# Patient Record
Sex: Female | Born: 1935 | Race: White | Hispanic: No | Smoking: Former smoker
Health system: Southern US, Community
[De-identification: ages and names within clinical notes are randomized; demographics above are authoritative.]

## PROBLEM LIST (undated history)

## (undated) DIAGNOSIS — E785 Hyperlipidemia, unspecified: Secondary | ICD-10-CM

## (undated) DIAGNOSIS — I240 Acute coronary thrombosis not resulting in myocardial infarction: Secondary | ICD-10-CM

## (undated) DIAGNOSIS — I10 Essential (primary) hypertension: Secondary | ICD-10-CM

## (undated) DIAGNOSIS — M1990 Unspecified osteoarthritis, unspecified site: Secondary | ICD-10-CM

## (undated) DIAGNOSIS — F0280 Dementia in other diseases classified elsewhere without behavioral disturbance: Secondary | ICD-10-CM

## (undated) DIAGNOSIS — I214 Non-ST elevation (NSTEMI) myocardial infarction: Secondary | ICD-10-CM

## (undated) DIAGNOSIS — Z86010 Personal history of colonic polyps: Secondary | ICD-10-CM

## (undated) DIAGNOSIS — I4891 Unspecified atrial fibrillation: Secondary | ICD-10-CM

## (undated) DIAGNOSIS — I429 Cardiomyopathy, unspecified: Secondary | ICD-10-CM

## (undated) DIAGNOSIS — K219 Gastro-esophageal reflux disease without esophagitis: Secondary | ICD-10-CM

## (undated) DIAGNOSIS — G309 Alzheimer's disease, unspecified: Secondary | ICD-10-CM

## (undated) DIAGNOSIS — M8580 Other specified disorders of bone density and structure, unspecified site: Secondary | ICD-10-CM

## (undated) DIAGNOSIS — G4700 Insomnia, unspecified: Secondary | ICD-10-CM

## (undated) DIAGNOSIS — N8502 Endometrial intraepithelial neoplasia [EIN]: Secondary | ICD-10-CM

## (undated) DIAGNOSIS — H3530 Unspecified macular degeneration: Secondary | ICD-10-CM

## (undated) HISTORY — DX: Insomnia, unspecified: G47.00

## (undated) HISTORY — DX: Non-ST elevation (NSTEMI) myocardial infarction: I21.4

## (undated) HISTORY — DX: Unspecified macular degeneration: H35.30

## (undated) HISTORY — DX: Endometrial intraepithelial neoplasia (EIN): N85.02

## (undated) HISTORY — DX: Acute coronary thrombosis not resulting in myocardial infarction: I24.0

## (undated) HISTORY — DX: Personal history of colonic polyps: Z86.010

## (undated) HISTORY — DX: Dementia in other diseases classified elsewhere, unspecified severity, without behavioral disturbance, psychotic disturbance, mood disturbance, and anxiety: F02.80

## (undated) HISTORY — DX: Alzheimer's disease, unspecified: G30.9

## (undated) HISTORY — DX: Cardiomyopathy, unspecified: I42.9

## (undated) HISTORY — DX: Unspecified osteoarthritis, unspecified site: M19.90

## (undated) HISTORY — DX: Other specified disorders of bone density and structure, unspecified site: M85.80

## (undated) HISTORY — DX: Hyperlipidemia, unspecified: E78.5

## (undated) HISTORY — DX: Unspecified atrial fibrillation: I48.91

---

## 1975-06-12 HISTORY — PX: APPENDECTOMY: SHX54

## 1993-06-11 DIAGNOSIS — N8502 Endometrial intraepithelial neoplasia [EIN]: Secondary | ICD-10-CM

## 1993-06-11 HISTORY — PX: ABDOMINAL HYSTERECTOMY: SHX81

## 1993-06-11 HISTORY — DX: Endometrial intraepithelial neoplasia (EIN): N85.02

## 1998-06-11 DIAGNOSIS — Z860100 Personal history of colon polyps, unspecified: Secondary | ICD-10-CM

## 1998-06-11 HISTORY — DX: Personal history of colonic polyps: Z86.010

## 1998-06-11 HISTORY — DX: Personal history of colon polyps, unspecified: Z86.0100

## 1999-01-05 ENCOUNTER — Other Ambulatory Visit: Admission: RE | Admit: 1999-01-05 | Discharge: 1999-01-05 | Payer: Self-pay | Admitting: Internal Medicine

## 1999-03-02 ENCOUNTER — Other Ambulatory Visit: Admission: RE | Admit: 1999-03-02 | Discharge: 1999-03-02 | Payer: Self-pay | Admitting: *Deleted

## 2001-03-06 ENCOUNTER — Other Ambulatory Visit: Admission: RE | Admit: 2001-03-06 | Discharge: 2001-03-06 | Payer: Self-pay | Admitting: *Deleted

## 2011-06-12 DIAGNOSIS — I4891 Unspecified atrial fibrillation: Secondary | ICD-10-CM

## 2011-06-12 HISTORY — DX: Unspecified atrial fibrillation: I48.91

## 2012-02-10 DIAGNOSIS — I214 Non-ST elevation (NSTEMI) myocardial infarction: Secondary | ICD-10-CM | POA: Insufficient documentation

## 2012-02-10 HISTORY — DX: Non-ST elevation (NSTEMI) myocardial infarction: I21.4

## 2012-02-18 ENCOUNTER — Inpatient Hospital Stay (HOSPITAL_COMMUNITY)
Admission: EM | Admit: 2012-02-18 | Discharge: 2012-02-22 | DRG: 281 | Disposition: A | Discharge status: Home / Self Care | Payer: Medicare Other | Attending: Cardiology | Admitting: Cardiology

## 2012-02-18 ENCOUNTER — Encounter (HOSPITAL_COMMUNITY): Payer: Self-pay | Admitting: Cardiology

## 2012-02-18 ENCOUNTER — Emergency Department (HOSPITAL_COMMUNITY): Payer: Medicare Other

## 2012-02-18 DIAGNOSIS — I428 Other cardiomyopathies: Secondary | ICD-10-CM | POA: Diagnosis present

## 2012-02-18 DIAGNOSIS — K219 Gastro-esophageal reflux disease without esophagitis: Secondary | ICD-10-CM | POA: Diagnosis present

## 2012-02-18 DIAGNOSIS — J069 Acute upper respiratory infection, unspecified: Secondary | ICD-10-CM | POA: Diagnosis present

## 2012-02-18 DIAGNOSIS — I10 Essential (primary) hypertension: Secondary | ICD-10-CM | POA: Diagnosis present

## 2012-02-18 DIAGNOSIS — I214 Non-ST elevation (NSTEMI) myocardial infarction: Secondary | ICD-10-CM

## 2012-02-18 DIAGNOSIS — F22 Delusional disorders: Secondary | ICD-10-CM | POA: Diagnosis not present

## 2012-02-18 DIAGNOSIS — I4891 Unspecified atrial fibrillation: Principal | ICD-10-CM

## 2012-02-18 DIAGNOSIS — Z823 Family history of stroke: Secondary | ICD-10-CM

## 2012-02-18 DIAGNOSIS — Z7901 Long term (current) use of anticoagulants: Secondary | ICD-10-CM

## 2012-02-18 DIAGNOSIS — Z87891 Personal history of nicotine dependence: Secondary | ICD-10-CM

## 2012-02-18 DIAGNOSIS — F05 Delirium due to known physiological condition: Secondary | ICD-10-CM | POA: Diagnosis not present

## 2012-02-18 DIAGNOSIS — Z79899 Other long term (current) drug therapy: Secondary | ICD-10-CM

## 2012-02-18 DIAGNOSIS — E785 Hyperlipidemia, unspecified: Secondary | ICD-10-CM | POA: Diagnosis present

## 2012-02-18 DIAGNOSIS — I2510 Atherosclerotic heart disease of native coronary artery without angina pectoris: Secondary | ICD-10-CM | POA: Diagnosis present

## 2012-02-18 HISTORY — DX: Essential (primary) hypertension: I10

## 2012-02-18 HISTORY — DX: Gastro-esophageal reflux disease without esophagitis: K21.9

## 2012-02-18 LAB — CBC WITH DIFFERENTIAL/PLATELET
Basophils Absolute: 0 10*3/uL (ref 0.0–0.1)
Basophils Relative: 0 % (ref 0–1)
Eosinophils Relative: 0 % (ref 0–5)
Lymphocytes Relative: 9 % — ABNORMAL LOW (ref 12–46)
MCHC: 33.3 g/dL (ref 30.0–36.0)
Monocytes Absolute: 1 10*3/uL (ref 0.1–1.0)
Neutro Abs: 11.7 10*3/uL — ABNORMAL HIGH (ref 1.7–7.7)
Platelets: 329 10*3/uL (ref 150–400)
RDW: 13.4 % (ref 11.5–15.5)
WBC: 14.1 10*3/uL — ABNORMAL HIGH (ref 4.0–10.5)

## 2012-02-18 LAB — COMPREHENSIVE METABOLIC PANEL
ALT: 28 U/L (ref 0–35)
Alkaline Phosphatase: 83 U/L (ref 39–117)
BUN: 15 mg/dL (ref 6–23)
CO2: 27 mEq/L (ref 19–32)
Chloride: 100 mEq/L (ref 96–112)
GFR calc Af Amer: 69 mL/min — ABNORMAL LOW (ref >90)
Glucose, Bld: 124 mg/dL — ABNORMAL HIGH (ref 70–99)
Potassium: 4.3 mEq/L (ref 3.5–5.1)
Sodium: 139 mEq/L (ref 135–145)
Total Bilirubin: 0.3 mg/dL (ref 0.3–1.2)

## 2012-02-18 LAB — PROTIME-INR: Prothrombin Time: 14.1 seconds (ref 11.6–15.2)

## 2012-02-18 LAB — POCT I-STAT TROPONIN I: Troponin i, poc: 6.96 ng/mL (ref 0.00–0.08)

## 2012-02-18 LAB — MRSA PCR SCREENING: MRSA by PCR: NEGATIVE

## 2012-02-18 MED ORDER — HEPARIN (PORCINE) IN NACL 100-0.45 UNIT/ML-% IJ SOLN
950.0000 [IU]/h | INTRAMUSCULAR | Status: DC
  Administered 2012-02-18: 700 [IU]/h via INTRAVENOUS
  Filled 2012-02-18 (×2): qty 250

## 2012-02-18 MED ORDER — METOPROLOL TARTRATE 25 MG PO TABS
25.0000 mg | ORAL_TABLET | Freq: Two times a day (BID) | ORAL | Status: DC
  Administered 2012-02-18: 25 mg via ORAL
  Filled 2012-02-18 (×3): qty 1

## 2012-02-18 MED ORDER — ESCITALOPRAM OXALATE 10 MG PO TABS
10.0000 mg | ORAL_TABLET | Freq: Every day | ORAL | Status: DC
  Administered 2012-02-19 – 2012-02-20 (×2): 10 mg via ORAL
  Filled 2012-02-18 (×2): qty 1

## 2012-02-18 MED ORDER — ZOLPIDEM TARTRATE 5 MG PO TABS
5.0000 mg | ORAL_TABLET | Freq: Every evening | ORAL | Status: DC | PRN
  Administered 2012-02-18: 5 mg via ORAL
  Filled 2012-02-18: qty 1

## 2012-02-18 MED ORDER — ASPIRIN 81 MG PO CHEW
324.0000 mg | CHEWABLE_TABLET | ORAL | Status: AC
  Administered 2012-02-19: 324 mg via ORAL
  Filled 2012-02-18: qty 4

## 2012-02-18 MED ORDER — WHITE PETROLATUM GEL
Status: AC
  Filled 2012-02-18: qty 5

## 2012-02-18 MED ORDER — TEMAZEPAM 15 MG PO CAPS: 30.0000 mg | ORAL_CAPSULE | Freq: Every evening | ORAL | Status: DC | PRN

## 2012-02-18 MED ORDER — ASPIRIN EC 81 MG PO TBEC
81.0000 mg | DELAYED_RELEASE_TABLET | Freq: Every day | ORAL | Status: DC
  Filled 2012-02-18 (×2): qty 1

## 2012-02-18 MED ORDER — SODIUM CHLORIDE 0.9 % IV SOLN: 250.0000 mL | INTRAVENOUS | Status: DC | PRN

## 2012-02-18 MED ORDER — SODIUM CHLORIDE 0.9 % IJ SOLN: 3.0000 mL | INTRAMUSCULAR | Status: DC | PRN

## 2012-02-18 MED ORDER — ACETAMINOPHEN 325 MG PO TABS: 650.0000 mg | ORAL_TABLET | ORAL | Status: DC | PRN

## 2012-02-18 MED ORDER — DILTIAZEM HCL 100 MG IV SOLR
5.0000 mg/h | INTRAVENOUS | Status: DC
  Administered 2012-02-18 – 2012-02-19 (×2): 5 mg/h via INTRAVENOUS
  Filled 2012-02-18: qty 100

## 2012-02-18 MED ORDER — NITROGLYCERIN 0.4 MG SL SUBL: 0.4000 mg | SUBLINGUAL_TABLET | SUBLINGUAL | Status: DC | PRN

## 2012-02-18 MED ORDER — ATORVASTATIN CALCIUM 40 MG PO TABS
40.0000 mg | ORAL_TABLET | Freq: Every day | ORAL | Status: DC
  Administered 2012-02-18 – 2012-02-21 (×3): 40 mg via ORAL
  Filled 2012-02-18 (×6): qty 1

## 2012-02-18 MED ORDER — SODIUM CHLORIDE 0.9 % IV SOLN
INTRAVENOUS | Status: DC
  Administered 2012-02-19: 75 mL/h via INTRAVENOUS

## 2012-02-18 MED ORDER — HEPARIN BOLUS VIA INFUSION
3000.0000 [IU] | Freq: Once | INTRAVENOUS | Status: AC
  Administered 2012-02-18: 3000 [IU] via INTRAVENOUS

## 2012-02-18 MED ORDER — AZITHROMYCIN 500 MG PO TABS
500.0000 mg | ORAL_TABLET | Freq: Every day | ORAL | Status: AC
  Administered 2012-02-18: 500 mg via ORAL
  Filled 2012-02-18: qty 1

## 2012-02-18 MED ORDER — DILTIAZEM HCL 50 MG/10ML IV SOLN: 20.0000 mg | Freq: Once | INTRAVENOUS | Status: DC

## 2012-02-18 MED ORDER — DILTIAZEM HCL 100 MG IV SOLR
5.0000 mg/h | INTRAVENOUS | Status: DC
  Administered 2012-02-18: 5 mg/h via INTRAVENOUS
  Filled 2012-02-18: qty 100

## 2012-02-18 MED ORDER — ASPIRIN 81 MG PO CHEW
324.0000 mg | CHEWABLE_TABLET | ORAL | Status: AC
  Administered 2012-02-18: 324 mg via ORAL
  Filled 2012-02-18: qty 4

## 2012-02-18 MED ORDER — ONDANSETRON HCL 4 MG/2ML IJ SOLN
4.0000 mg | Freq: Four times a day (QID) | INTRAMUSCULAR | Status: DC | PRN
  Administered 2012-02-19 (×2): 4 mg via INTRAVENOUS
  Filled 2012-02-18: qty 2

## 2012-02-18 MED ORDER — DILTIAZEM LOAD VIA INFUSION
20.0000 mg | Freq: Once | INTRAVENOUS | Status: DC
  Filled 2012-02-18: qty 20

## 2012-02-18 MED ORDER — AZITHROMYCIN 250 MG PO TABS
250.0000 mg | ORAL_TABLET | Freq: Every day | ORAL | Status: DC
  Administered 2012-02-19 – 2012-02-21 (×3): 250 mg via ORAL
  Filled 2012-02-18 (×3): qty 1

## 2012-02-18 MED ORDER — SODIUM CHLORIDE 0.9 % IJ SOLN
3.0000 mL | Freq: Two times a day (BID) | INTRAMUSCULAR | Status: DC
  Administered 2012-02-20 – 2012-02-21 (×2): 3 mL via INTRAVENOUS

## 2012-02-18 NOTE — ED Notes (Signed)
Attempted IV x's 2 without success.  IV team notified will attempt IV access when pt gets to room.  [place], RN made aware.

## 2012-02-18 NOTE — ED Provider Notes (Signed)
History     CSN: [id number]  Arrival date & time 02/18/12  1508   First MD Initiated Contact with Patient 02/18/12 1516      Chief Complaint  Patient presents with  . Atrial Fibrillation    Patient is a 78 year old female with past medical history significant for hypertension who presents with complaints of irregular heart rate. Patient states over the last week her only symptom has included nasal congestion. However today while being evaluated by nurse at nursing home she was told that her heart rate was irregular and fast. Thus she was instructed to come to the emergency department. Patient denies any palpitations, chest pain, shortness of breath, nausea, and only reports one bout of intermittent diaphoresis earlier this morning.  (Consider location/radiation/quality/duration/timing/severity/associated sxs/prior treatment) Patient is a 78 y.o. female presenting with general illness. The history is provided by the patient and the EMS personnel. No language interpreter was used.  Illness  The current episode started today. The onset was sudden. The problem occurs continuously. The problem has been unchanged. The problem is moderate. Nothing relieves the symptoms. Nothing aggravates the symptoms. Associated symptoms include URI. Pertinent negatives include no fever, no diarrhea, no nausea and no rash. She has been behaving normally. She has been drinking less than usual and eating less than usual. Urine output has been normal. The last void occurred less than 6 hours ago. There were no sick contacts. She has received no recent medical care.    Past Medical History  Diagnosis Date  . Hypertension   . Gastroesophageal reflux disease     Past Surgical History  Procedure Date  . Abdominal hysterectomy   . Appendectomy     Family History  Problem Relation Age of Onset  . Stroke Mother     History  Substance Use Topics  . Smoking status: Not on file  . Smokeless tobacco: Not on file   . Alcohol Use: Not on file    OB History    No data available      Review of Systems  Constitutional: Negative for fever.  Gastrointestinal: Negative for nausea and diarrhea.  Skin: Negative for rash.  All other systems reviewed and are negative.    Allergies  Codeine  Home Medications  No current outpatient prescriptions on file.  BP 185/106  Pulse 143  Temp 98.7 F (37.1 C) (Oral)  Resp 18  SpO2 97%  Physical Exam  Constitutional: She is oriented to person, place, and time. She appears well-developed and well-nourished. No distress.  HENT:  Head: Normocephalic and atraumatic.  Right Ear: External ear normal.  Left Ear: External ear normal.  Mouth/Throat: Oropharynx is clear and moist.  Eyes: Conjunctivae and EOM are normal. Pupils are equal, round, and reactive to light.  Neck: Normal range of motion. Neck supple.  Cardiovascular: Intact distal pulses.  An irregularly irregular rhythm present. Tachycardia present.   Pulmonary/Chest: Effort normal and breath sounds normal. No respiratory distress. She has no wheezes. She has no rales. She exhibits no tenderness.  Abdominal: Soft. Bowel sounds are normal. She exhibits no distension and no mass. There is no tenderness. There is no rebound and no guarding.  Musculoskeletal: Normal range of motion. She exhibits no edema and no tenderness.  Neurological: She is alert and oriented to person, place, and time. She has normal reflexes. She displays normal reflexes. No cranial nerve deficit. She exhibits normal muscle tone. Coordination normal.  Skin: Skin is warm and dry.  Psychiatric: She has a  normal mood and affect.    ED Course  Procedures (including critical care time)  Labs Reviewed  COMPREHENSIVE METABOLIC PANEL - Abnormal; Notable for the following:    Glucose, Bld 124 (*)     Albumin 3.4 (*)     AST 50 (*)     GFR calc non Af Amer 59 (*)     GFR calc Af Amer 69 (*)     All other components within normal  limits  CBC WITH DIFFERENTIAL - Abnormal; Notable for the following:    WBC 14.1 (*)     Neutrophils Relative 83 (*)     Neutro Abs 11.7 (*)     Lymphocytes Relative 9 (*)     All other components within normal limits  POCT I-STAT TROPONIN I - Abnormal; Notable for the following:    Troponin i, poc 6.96 (*)     All other components within normal limits  PROTIME-INR  TSH   Dg Chest Portable 1 View  02/18/2012  *RADIOLOGY REPORT*  Clinical Data: Cough and congestion  PORTABLE CHEST - 1 VIEW  Comparison: None.  Findings: 1720 hours.  Lung volumes are low. The cardiopericardial silhouette is enlarged.  There is mild vascular congestion without pulmonary edema.  No focal airspace consolidation. Imaged bony structures of the thorax are intact. Telemetry leads overlie the chest.  IMPRESSION: Cardiomegaly with vascular congestion.   Original Report Authenticated By: ERIC A. MANSELL, M.D.       Date: 02/18/2012  Rate: 139   Rhythm: atrial fibrillation  QRS Axis: normal  Intervals: afib  ST/T Wave abnormalities: nonspecific ST changes and nonspecific T wave changes  Conduction Disutrbances:none  Narrative Interpretation:   Old EKG Reviewed: none available    1. Atrial fibrillation   2. Non-ST elevation myocardial infarction (NSTEMI)       MDM    Patient is 78 year old female with PMH relevant for HTN who presents with irregular, fast heart rate.  Af and VS remarkable for initial HR in the 140-160s upon arrival.  PE non contributory other than irregularly, irregular tachycardia.  EKG showed afib with RVR.  Patient given diltiazem bolus followed by gtt.  S/p treatment HR less than 100 .  Review of labs shows elevated troponin at 6.96, leukocytosis, and AST of 50.  Cardiology consulted and patient admitted for new onset afib and elevated troponin without acute events.  ASA 324 given by EMS and dose confirmed.          Johnney Ou, MD 02/18/12 [phone]

## 2012-02-18 NOTE — Progress Notes (Signed)
ANTICOAGULATION CONSULT NOTE - Initial Consult  Pharmacy Consult for Heparin Indication: chest pain/ACS (NSTEMI) and atrial fibrillation  Allergies  Allergen Reactions  . Codeine     Patient Measurements:   Height ~ 66 inches Weight ~ 59 kg IBW = 59.3 kg Heparin Dosing Weight: 59 kg  Vital Signs: Temp: 98.7 F (37.1 C) (09/09 1516) Temp src: Oral (09/09 1516) BP: 145/101 mmHg (09/09 1825) Pulse Rate: 119  (09/09 1825)  Labs:  Basename 02/18/12 1536  HGB 13.6  HCT 40.9  PLT 329  APTT --  LABPROT 14.1  INR 1.07  HEPARINUNFRC --  CREATININE 0.85  CKTOTAL --  CKMB --  TROPONINI --   CrCl ~ 40 ml/min   Medical History: Past Medical History  Diagnosis Date  . Hypertension   . Gastroesophageal reflux disease     Medications:  Home meds: ASA, Lexapro, Procardia, Restoril  Assessment: 78 yo F admitted 02/18/2012 with a.fib and NSTEMI.  Pt reports a 10 day history of fatigue and productive cough with yellow-white sputum.  Pt denies dyspnea or CP.  Was sent to the ER by home health nurse 2/2 tachycardia and found to have elevated troponin and EKG changes.  Pt has been started on Diltiazem gtt, ASA, statin, beta blocker, and heparin with plans for cardiac cath.  Goal of Therapy:  Heparin level 0.3-0.7 units/ml Monitor platelets by anticoagulation protocol: Yes   Plan:  Heparin 3000 units IV bolus x 1 Heparin infusion at 700 units/hr. Heparin level 6 hours after infusion started. Heparin level and CBC daily while on heparin.  [employer], Pharm.D., BCPS Clinical Pharmacist Pager [phone] 02/18/2012 7:33 PM

## 2012-02-18 NOTE — H&P (Signed)
HPI: 78 year old female with no prior cardiac history admitted with atrial fibrillation and non-ST elevation myocardial infarction. Patient typically does not have significant dyspnea on exertion, orthopnea, PND, pedal edema, palpitations, syncope or exertional chest pain. Over the past 10 days she has described fatigue. She has also noticed a cough productive of yellowish-white sputum. She denies dyspnea or chest pain. Her nurse checked her pulse today and noted that she was tachycardic and she was sent to the emergency room.   (Not in a hospital admission)  Allergies  Allergen Reactions  . Codeine     Past Medical History  Diagnosis Date  . Hypertension   . Gastroesophageal reflux disease     Past Surgical History  Procedure Date  . Abdominal hysterectomy   . Appendectomy     History   Social History  . Marital Status: Widowed    Spouse Name: N/A    Number of Children: N/A  . Years of Education: N/A   Occupational History  . Not on file.   Social History Main Topics  . Smoking status: Former Games developer  . Smokeless tobacco: Not on file  . Alcohol Use: Yes  . Drug Use: Not on file  . Sexually Active: Not on file   Other Topics Concern  . Not on file   Social History Narrative  . No narrative on file    Family History  Problem Relation Age of Onset  . Stroke Mother     ROS:  no fevers or chills, productive cough, hemoptysis, dysphasia, odynophagia, melena, hematochezia, dysuria, hematuria, rash, seizure activity, orthopnea, PND, pedal edema, claudication. Remaining systems are negative.  Physical Exam:   Blood pressure 185/106, pulse 143, temperature 98.7 F (37.1 C), temperature source Oral, resp. rate 18, SpO2 97.00%.  General:  Well developed/well nourished in NAD Skin warm/dry Patient not depressed No peripheral clubbing Back-normal HEENT-normal/normal eyelids Neck supple/normal carotid upstroke bilaterally; no bruits; no JVD; no thyromegaly chest -  CTA/ normal expansion CV - tachycardic, irregular/normal S1 and S2; no murmurs, rubs or gallops;  PMI nondisplaced Abdomen -NT/ND, no HSM, no mass, + bowel sounds, no bruit 2+ femoral pulses, no bruits Ext-no edema, chords, 2+ DP Neuro-grossly nonfocal  ECG atrial fibrillation at a rate of 139, anterior and inferior T-wave inversion.  Results for orders placed during the hospital encounter of 02/18/12 (from the past 48 hour(s))  COMPREHENSIVE METABOLIC PANEL     Status: Abnormal   Collection Time   02/18/12  3:36 PM      Component Value Range Comment   Sodium 139  135 - 145 mEq/L    Potassium 4.3  3.5 - 5.1 mEq/L    Chloride 100  96 - 112 mEq/L    CO2 27  19 - 32 mEq/L    Glucose, Bld 124 (*) 70 - 99 mg/dL    BUN 15  6 - 23 mg/dL    Creatinine, Ser 0.86  0.50 - 1.10 mg/dL    Calcium 9.4  8.4 - 57.8 mg/dL    Total Protein 7.1  6.0 - 8.3 g/dL    Albumin 3.4 (*) 3.5 - 5.2 g/dL    AST 50 (*) 0 - 37 U/L    ALT 28  0 - 35 U/L    Alkaline Phosphatase 83  39 - 117 U/L    Total Bilirubin 0.3  0.3 - 1.2 mg/dL    GFR calc non Af Amer 59 (*) >90 mL/min    GFR calc Af Amer 69 (*) >  90 mL/min   CBC WITH DIFFERENTIAL     Status: Abnormal   Collection Time   02/18/12  3:36 PM      Component Value Range Comment   WBC 14.1 (*) 4.0 - 10.5 K/uL    RBC 4.61  3.87 - 5.11 MIL/uL    Hemoglobin 13.6  12.0 - 15.0 g/dL    HCT 40.9  81.1 - 91.4 %    MCV 88.7  78.0 - 100.0 fL    MCH 29.5  26.0 - 34.0 pg    MCHC 33.3  30.0 - 36.0 g/dL    RDW 78.2  95.6 - 21.3 %    Platelets 329  150 - 400 K/uL    Neutrophils Relative 83 (*) 43 - 77 %    Neutro Abs 11.7 (*) 1.7 - 7.7 K/uL    Lymphocytes Relative 9 (*) 12 - 46 %    Lymphs Abs 1.3  0.7 - 4.0 K/uL    Monocytes Relative 7  3 - 12 %    Monocytes Absolute 1.0  0.1 - 1.0 K/uL    Eosinophils Relative 0  0 - 5 %    Eosinophils Absolute 0.1  0.0 - 0.7 K/uL    Basophils Relative 0  0 - 1 %    Basophils Absolute 0.0  0.0 - 0.1 K/uL   PROTIME-INR     Status:  Normal   Collection Time   02/18/12  3:36 PM      Component Value Range Comment   Prothrombin Time 14.1  11.6 - 15.2 seconds    INR 1.07  0.00 - 1.49   POCT I-STAT TROPONIN I     Status: Abnormal   Collection Time   02/18/12  3:52 PM      Component Value Range Comment   Troponin i, poc 6.96 (*) 0.00 - 0.08 ng/mL    Comment NOTIFIED PHYSICIAN      Comment 3              Dg Chest Portable 1 View  02/18/2012  *RADIOLOGY REPORT*  Clinical Data: Cough and congestion  PORTABLE CHEST - 1 VIEW  Comparison: None.  Findings: 1720 hours.  Lung volumes are low. The cardiopericardial silhouette is enlarged.  There is mild vascular congestion without pulmonary edema.  No focal airspace consolidation. Imaged bony structures of the thorax are intact. Telemetry leads overlie the chest.  IMPRESSION: Cardiomegaly with vascular congestion.   Original Report Authenticated By: ERIC A. MANSELL, M.D.     Assessment/Plan #1-non-ST elevation myocardial infarction-the patient presents with elevated cardiac enzymes most likely related to ischemia from her atrial fibrillation with a rapid ventricular response. She also has deep anterior and inferior T-wave inversion concerning for an LAD lesion. She has been very healthy, active and lives independently. I think we should proceed with cardiac catheterization. The risks and benefits were discussed and the patient agrees to proceed. We will treat with aspirin, statin, heparin and beta blockade. #2-atrial fibrillation-the patient has new-onset atrial fibrillation. This may have been related to her recent URI. Plan to continue Cardizem for rate control. Add metoprolol as well. Check echocardiogram and TSH. Once she has had her cardiac catheterization and coronaries addressed she will need long-term anticoagulations most likely with xeralto. She has embolic risk factors of female sex, age greater than 77 and hypertension. Plan cardioversion later once her coronaries are addressed. She  would need 3 weeks of anticoagulation prior to the procedure or TEE guidance. #3-URI-patient has  a mildly productive cough. Her chest x-ray suggests mild vascular congestion. Will give Z-Pak. #4-hypertension-discontinue Procardia and treat with medications both for rate control and to control her blood pressure as well.  Olga Millers MD 02/18/2012, 6:25 PM

## 2012-02-18 NOTE — ED Notes (Signed)
Pt c/o weakness and "not feeling well" x2 weeks. EMS reports a-fib rvr on 12 lead ekg. 20g left AC. SNF gave BASA x4 prior to EMS arrival

## 2012-02-18 NOTE — ED Notes (Signed)
Heart Healthy Diet Tray ordered spoke w/ Eber Jones

## 2012-02-19 ENCOUNTER — Encounter (HOSPITAL_COMMUNITY): Payer: Self-pay | Admitting: Cardiology

## 2012-02-19 ENCOUNTER — Encounter (HOSPITAL_COMMUNITY)
Admission: EM | Disposition: A | Discharge status: Home / Self Care | Payer: Self-pay | Source: Home / Self Care | Attending: Cardiology

## 2012-02-19 DIAGNOSIS — I214 Non-ST elevation (NSTEMI) myocardial infarction: Secondary | ICD-10-CM

## 2012-02-19 DIAGNOSIS — I369 Nonrheumatic tricuspid valve disorder, unspecified: Secondary | ICD-10-CM

## 2012-02-19 HISTORY — PX: LEFT HEART CATHETERIZATION WITH CORONARY ANGIOGRAM: SHX5451

## 2012-02-19 LAB — BASIC METABOLIC PANEL
CO2: 24 mEq/L (ref 19–32)
Calcium: 9.1 mg/dL (ref 8.4–10.5)
Chloride: 102 mEq/L (ref 96–112)
Glucose, Bld: 130 mg/dL — ABNORMAL HIGH (ref 70–99)
Potassium: 3.9 mEq/L (ref 3.5–5.1)
Sodium: 139 mEq/L (ref 135–145)

## 2012-02-19 LAB — URINALYSIS, ROUTINE W REFLEX MICROSCOPIC
Hgb urine dipstick: NEGATIVE
Ketones, ur: NEGATIVE mg/dL
Leukocytes, UA: NEGATIVE
Protein, ur: NEGATIVE mg/dL
Urobilinogen, UA: 0.2 mg/dL (ref 0.0–1.0)

## 2012-02-19 LAB — CBC
HCT: 37.9 % (ref 36.0–46.0)
Hemoglobin: 12.5 g/dL (ref 12.0–15.0)
MCH: 29.3 pg (ref 26.0–34.0)
MCV: 89 fL (ref 78.0–100.0)
RBC: 4.26 MIL/uL (ref 3.87–5.11)
WBC: 14.3 10*3/uL — ABNORMAL HIGH (ref 4.0–10.5)

## 2012-02-19 LAB — LIPID PANEL
Cholesterol: 153 mg/dL (ref 0–200)
HDL: 42 mg/dL (ref >39)
LDL Cholesterol: 96 mg/dL (ref 0–99)
Triglycerides: 75 mg/dL (ref <150)
VLDL: 15 mg/dL (ref 0–40)

## 2012-02-19 LAB — TSH: TSH: 3.79 u[IU]/mL (ref 0.350–4.500)

## 2012-02-19 LAB — URINE MICROSCOPIC-ADD ON

## 2012-02-19 SURGERY — LEFT HEART CATHETERIZATION WITH CORONARY ANGIOGRAM
Anesthesia: LOCAL

## 2012-02-19 MED ORDER — RIVAROXABAN 15 MG PO TABS
15.0000 mg | ORAL_TABLET | Freq: Every day | ORAL | Status: DC
Start: 2012-02-19 — End: 2012-02-19

## 2012-02-19 MED ORDER — RIVAROXABAN 15 MG PO TABS
15.0000 mg | ORAL_TABLET | Freq: Every day | ORAL | Status: DC
  Administered 2012-02-19 – 2012-02-21 (×2): 15 mg via ORAL
  Filled 2012-02-19 (×4): qty 1

## 2012-02-19 MED ORDER — MIDAZOLAM HCL 2 MG/2ML IJ SOLN
INTRAMUSCULAR | Status: AC
  Filled 2012-02-19: qty 2

## 2012-02-19 MED ORDER — LIDOCAINE HCL (PF) 1 % IJ SOLN
INTRAMUSCULAR | Status: AC
  Filled 2012-02-19: qty 30

## 2012-02-19 MED ORDER — FENTANYL CITRATE 0.05 MG/ML IJ SOLN
INTRAMUSCULAR | Status: AC
Start: 2012-02-19 — End: 2012-02-19
  Filled 2012-02-19: qty 2

## 2012-02-19 MED ORDER — ONDANSETRON HCL 4 MG/2ML IJ SOLN: 4.0000 mg | Freq: Four times a day (QID) | INTRAMUSCULAR | Status: DC | PRN

## 2012-02-19 MED ORDER — FAMOTIDINE 20 MG PO TABS
20.0000 mg | ORAL_TABLET | Freq: Two times a day (BID) | ORAL | Status: DC
  Administered 2012-02-19 – 2012-02-22 (×6): 20 mg via ORAL
  Filled 2012-02-19 (×8): qty 1

## 2012-02-19 MED ORDER — NITROGLYCERIN 0.2 MG/ML ON CALL CATH LAB
INTRAVENOUS | Status: AC
  Filled 2012-02-19: qty 1

## 2012-02-19 MED ORDER — ALPRAZOLAM 0.25 MG PO TABS
0.2500 mg | ORAL_TABLET | Freq: Two times a day (BID) | ORAL | Status: DC | PRN
  Administered 2012-02-19: 0.25 mg via ORAL
  Filled 2012-02-19: qty 1

## 2012-02-19 MED ORDER — METOPROLOL TARTRATE 25 MG PO TABS
25.0000 mg | ORAL_TABLET | Freq: Three times a day (TID) | ORAL | Status: DC
  Administered 2012-02-19 (×3): 25 mg via ORAL
  Filled 2012-02-19 (×4): qty 1

## 2012-02-19 MED ORDER — HALOPERIDOL LACTATE 5 MG/ML IJ SOLN
2.0000 mg | Freq: Once | INTRAMUSCULAR | Status: DC
  Filled 2012-02-19: qty 1

## 2012-02-19 MED ORDER — RIVAROXABAN 20 MG PO TABS: 20.0000 mg | ORAL_TABLET | Freq: Every day | ORAL | Status: DC

## 2012-02-19 MED ORDER — ONDANSETRON HCL 4 MG/2ML IJ SOLN
INTRAMUSCULAR | Status: AC
  Filled 2012-02-19: qty 2

## 2012-02-19 MED ORDER — ACETAMINOPHEN 325 MG PO TABS: 650.0000 mg | ORAL_TABLET | ORAL | Status: DC | PRN

## 2012-02-19 MED ORDER — HEPARIN (PORCINE) IN NACL 2-0.9 UNIT/ML-% IJ SOLN
INTRAMUSCULAR | Status: AC
  Filled 2012-02-19: qty 3000

## 2012-02-19 NOTE — Progress Notes (Signed)
Discussed dosing/timing of Xarelto initiation with Dr. Excell Seltzer. Given variable CrCl (42-54mL/min) will initiate at 15mg  daily starting 4 hours post cath. Her mental status is much improved. She is A+Ox3 and can recall details of her admission and why she's here. She is in good spirits. Dayna Dunn PA-C

## 2012-02-19 NOTE — Progress Notes (Signed)
@   Subjective:  Denies CP or dyspnea; patient confused this AM; thinks she is at home; received ambien last PM.   Objective:  Filed Vitals:   02/19/12 0310 02/19/12 0400 02/19/12 0500 02/19/12 0600  BP:  108/62 124/55 115/65  Pulse: 105 101 105   Temp:      TempSrc:      Resp:  18 26   Height:      Weight:      SpO2: 92% 93%      Intake/Output from previous day:  Intake/Output Summary (Last 24 hours) at 02/19/12 0626 Last data filed at 02/19/12 0600  Gross per 24 hour  Intake    245 ml  Output      0 ml  Net    245 ml    Physical Exam: Physical exam: Well-developed well-nourished in no acute distress.  Skin is warm and dry.  HEENT is normal.  Neck is supple.  Chest is clear to auscultation with normal expansion.  Cardiovascular exam is tachycardic and irregular Abdominal exam nontender or distended. No masses palpated. Extremities show no edema. neuro confused; moves all ext; no gross neuro findings    Lab Results: Basic Metabolic Panel:  Basename 02/19/12 0350 02/18/12 1536  NA 139 139  K 3.9 4.3  CL 102 100  CO2 24 27  GLUCOSE 130* 124*  BUN 16 15  CREATININE 0.71 0.85  CALCIUM 9.1 9.4  MG -- --  PHOS -- --   CBC:  Basename 02/19/12 0350 02/18/12 1536  WBC 14.3* 14.1*  NEUTROABS -- 11.7*  HGB 12.5 13.6  HCT 37.9 40.9  MCV 89.0 88.7  PLT 320 329   Cardiac Enzymes:  Basename 02/19/12 0350 02/18/12 2049  CKTOTAL -- --  CKMB -- --  CKMBINDEX -- --  TROPONINI 2.60* 5.73*     Assessment/Plan:  1) NSTEMI - continue ASA, lopressor, heparin and statin. Patient confused this AM most likely from ambien and sundowning; no gross neuro findings; reassess later AM. If improved, proceed with cath; ECG concerning for LAD lesion. 2) Atrial fibrillation - Continue cardizem; increase lopressor to 25 mg po TID; continue heparin and coumadin; await echo and TSH; begin xeralto once coronaries addressed; DCCV in 3 weeks or TEE guided if rate difficult to  control. Watch BP. 3) URI - continue azithromycin.  Brian Crenshaw 02/19/2012, 6:26 AM    

## 2012-02-19 NOTE — Progress Notes (Signed)
ANTICOAGULATION CONSULT NOTE - Follow Up Consult  Pharmacy Consult for Heparin Indication: chest pain/ACS (NSTEMI) and atrial fibrillation  Allergies  Allergen Reactions  . Codeine     Patient Measurements: Height: 5\' 6"  (167.6 cm) Weight: 130 lb (58.968 kg) IBW/kg (Calculated) : 59.3  Height ~ 66 inches Weight ~ 59 kg IBW = 59.3 kg Heparin Dosing Weight: 59 kg  Vital Signs: Temp: 98.5 F (36.9 C) (09/09 2145) Temp src: Oral (09/09 2145) BP: 107/54 mmHg (09/10 0307) Pulse Rate: 104  (09/10 0307)  Labs:  Basename 02/19/12 0350 02/18/12 2049 02/18/12 1536  HGB 12.5 -- 13.6  HCT 37.9 -- 40.9  PLT 320 -- 329  APTT -- -- --  LABPROT -- -- 14.1  INR -- -- 1.07  HEPARINUNFRC <0.10* -- --  CREATININE -- -- 0.85  CKTOTAL -- -- --  CKMB -- -- --  TROPONINI -- 5.73* --   CrCl ~ 40 ml/min   Medical History: Past Medical History  Diagnosis Date  . Hypertension   . Gastroesophageal reflux disease     Medications:  Home meds: ASA, Lexapro, Procardia, Restoril  Assessment: 78 yo F admitted 02/19/2012 with a.fib and NSTEMI.  Pt reports a 10 day history of fatigue and productive cough with yellow-white sputum.  Pt denies dyspnea or CP.  Was sent to the ER by home health nurse 2/2 tachycardia and found to have elevated troponin and EKG changes.  Pt has been started on Diltiazem gtt, ASA, statin, beta blocker, and heparin with plans for cardiac cath.  Heparin level (< 0.1) is below-goal on 700 units/hr.   Goal of Therapy:  Heparin level 0.3-0.7 units/ml Monitor platelets by anticoagulation protocol: Yes   Plan:  1. Increase IV heparin to 950 units/hr.  2. Heparin level in 8 hours vs follow-up post-cath.   Lorre Munroe, PharmD, BCPS 02/19/2012 5:26 AM

## 2012-02-19 NOTE — H&P (View-Only) (Signed)
@   Subjective:  Denies CP or dyspnea; patient confused this AM; thinks she is at home; received ambien last PM.   Objective:  Filed Vitals:   02/19/12 0310 02/19/12 0400 02/19/12 0500 02/19/12 0600  BP:  108/62 124/55 115/65  Pulse: 105 101 105   Temp:      TempSrc:      Resp:  18 26   Height:      Weight:      SpO2: 92% 93%      Intake/Output from previous day:  Intake/Output Summary (Last 24 hours) at 02/19/12 0626 Last data filed at 02/19/12 0600  Gross per 24 hour  Intake    245 ml  Output      0 ml  Net    245 ml    Physical Exam: Physical exam: Well-developed well-nourished in no acute distress.  Skin is warm and dry.  HEENT is normal.  Neck is supple.  Chest is clear to auscultation with normal expansion.  Cardiovascular exam is tachycardic and irregular Abdominal exam nontender or distended. No masses palpated. Extremities show no edema. neuro confused; moves all ext; no gross neuro findings    Lab Results: Basic Metabolic Panel:  Basename 02/19/12 0350 02/18/12 1536  NA 139 139  K 3.9 4.3  CL 102 100  CO2 24 27  GLUCOSE 130* 124*  BUN 16 15  CREATININE 0.71 0.85  CALCIUM 9.1 9.4  MG -- --  PHOS -- --   CBC:  Basename 02/19/12 0350 02/18/12 1536  WBC 14.3* 14.1*  NEUTROABS -- 11.7*  HGB 12.5 13.6  HCT 37.9 40.9  MCV 89.0 88.7  PLT 320 329   Cardiac Enzymes:  Basename 02/19/12 0350 02/18/12 2049  CKTOTAL -- --  CKMB -- --  CKMBINDEX -- --  TROPONINI 2.60* 5.73*     Assessment/Plan:  1) NSTEMI - continue ASA, lopressor, heparin and statin. Patient confused this AM most likely from [place] and sundowning; no gross neuro findings; reassess later AM. If improved, proceed with cath; ECG concerning for LAD lesion. 2) Atrial fibrillation - Continue cardizem; increase lopressor to 25 mg po TID; continue heparin and coumadin; await echo and TSH; begin xeralto once coronaries addressed; DCCV in 3 weeks or TEE guided if rate difficult to  control. Watch BP. 3) URI - continue azithromycin.  Olga Millers 02/19/2012, 6:26 AM

## 2012-02-19 NOTE — CV Procedure (Signed)
   Cardiac Catheterization Procedure Note  Name: [name] MRN: [id number] DOB: Aug 11, 1921  Procedure: Left Heart Cath, Selective Coronary Angiography, LV angiography  Indication: Non-ST elevation MI in the setting of atrial fibrillation   Procedural details: The right groin was prepped, draped, and anesthetized with 1% lidocaine. Using modified Seldinger technique, a 5 French sheath was introduced into the right femoral artery. Standard Judkins catheters were used for coronary angiography and left ventriculography. Catheter exchanges were performed over a guidewire. There were no immediate procedural complications. The femoral arteriotomy was closed with Perclose device the The patient was transferred to the post catheterization recovery area for further monitoring.  Procedural Findings: Hemodynamics:  AO 103/65 LV 110/50   Coronary angiography: Coronary dominance: right  Left mainstem: The left main is widely patent without obstructive CAD.  Left anterior descending (LAD): The LAD wraps around the left ventricular apex. There is minimal nonobstructive disease throughout the proximal LAD. The first and second diagonal branches are patent. There are no significant stenoses throughout. However, there is a large filling defect in the apical portion of the LAD after it wraps around the left ventricular apex. This is suggestive of thrombus in the apical LAD.  Left circumflex (LCx): The left circumflex is a large caliber vessel. It is smooth throughout its course. There 3 obtuse marginal branches without significant disease noted.  Right coronary artery (RCA): The right coronary artery is dominant. Smooth throughout its course. The PDA branch is patent without significant stenosis. There is a small posterolateral branch without significant disease.  Left ventriculography: There is a focal area of akinesis involving the distal inferior wall and left ventricular apex. The base of the heart  is vigorous and anterolateral wall contracts normally. The estimated left ventricular ejection fraction is 45%.  Final Conclusions:   1. Minor nonobstructive coronary artery disease 2. Large filling defect in the apical portion of the LAD suggestive of an embolic event 3. Moderate segmental left ventricular systolic dysfunction  Recommendations: The patient will be started on Xarelto for anticoagulation. Will transfer back to the CCU for continued rate-control of atrial fibrillation and post-MI medical Rx.  Tonny Bollman 02/19/2012, 10:28 AM

## 2012-02-19 NOTE — Interval H&P Note (Signed)
History and Physical Interval Note:  02/19/2012 9:39 AM  [name]  has presented today for surgery, with the diagnosis of cp  The various methods of treatment have been discussed with the patient and family. After consideration of risks, benefits and other options for treatment, the patient has consented to  Procedure(s) (LRB) with comments: LEFT HEART CATHETERIZATION WITH CORONARY ANGIOGRAM (N/A) as a surgical intervention .  The patient's history has been reviewed, patient examined, no change in status, stable for surgery.  I have reviewed the patient's chart and labs.  Questions were answered to the patient's satisfaction.     Tonny Bollman

## 2012-02-19 NOTE — Progress Notes (Signed)
Nursing- Patient was found out of bed and all monitoring equipment removed at 0100. Patient confused to place. She stated that this was her living room and she wanted to to sit in her chair. She refused help from staff to assist her back into to bed. Multiple attempts made to reorient patient to place,date,time, purpose and location were unsuccessful. Dr. Terressa Koyanagi was paged at 0115 and made aware of patient's current agitated state. While making the phone call several staff members stay at patient's bedside attempting to reorient and calm patient down. At 0120 , patient's daughter Larita Fife was called and made aware of change in patient.  At 0145, Dr. Terressa Koyanagi was at bedside to assess patient and speak with her directly. Orders received. Patient's daughter arrived at bedside at 0155 am and she spoke with Dr. Terressa Koyanagi directly. Patient became more calm with daughter's presents. Assisted patient back into bed. Pulse ox probe was removed by patient several times and intermittent checks underway.

## 2012-02-19 NOTE — Progress Notes (Signed)
Utilization Review Completed.  Kit Mollett T  02/19/2012  

## 2012-02-20 DIAGNOSIS — I214 Non-ST elevation (NSTEMI) myocardial infarction: Secondary | ICD-10-CM

## 2012-02-20 LAB — BASIC METABOLIC PANEL
CO2: 27 mEq/L (ref 19–32)
Calcium: 8.8 mg/dL (ref 8.4–10.5)
Creatinine, Ser: 0.85 mg/dL (ref 0.50–1.10)
GFR calc non Af Amer: 59 mL/min — ABNORMAL LOW (ref >90)
Glucose, Bld: 113 mg/dL — ABNORMAL HIGH (ref 70–99)
Sodium: 139 mEq/L (ref 135–145)

## 2012-02-20 LAB — CBC
Hemoglobin: 11.4 g/dL — ABNORMAL LOW (ref 12.0–15.0)
MCH: 28.7 pg (ref 26.0–34.0)
MCHC: 31.8 g/dL (ref 30.0–36.0)
MCV: 90.2 fL (ref 78.0–100.0)
RBC: 3.97 MIL/uL (ref 3.87–5.11)

## 2012-02-20 MED ORDER — FUROSEMIDE 20 MG PO TABS
20.0000 mg | ORAL_TABLET | Freq: Once | ORAL | Status: AC
  Administered 2012-02-20: 20 mg via ORAL
  Filled 2012-02-20: qty 1

## 2012-02-20 MED ORDER — DILTIAZEM HCL 30 MG PO TABS
30.0000 mg | ORAL_TABLET | Freq: Four times a day (QID) | ORAL | Status: DC
  Administered 2012-02-20 – 2012-02-21 (×2): 30 mg via ORAL
  Filled 2012-02-20 (×7): qty 1

## 2012-02-20 MED ORDER — DILTIAZEM HCL 30 MG PO TABS
30.0000 mg | ORAL_TABLET | Freq: Four times a day (QID) | ORAL | Status: DC
  Administered 2012-02-20 (×2): 30 mg via ORAL
  Filled 2012-02-20 (×6): qty 1

## 2012-02-20 MED ORDER — METOPROLOL TARTRATE 50 MG PO TABS
50.0000 mg | ORAL_TABLET | Freq: Two times a day (BID) | ORAL | Status: DC
  Administered 2012-02-20 – 2012-02-22 (×5): 50 mg via ORAL
  Filled 2012-02-20 (×7): qty 1

## 2012-02-20 MED ORDER — DOCUSATE SODIUM 100 MG PO CAPS
100.0000 mg | ORAL_CAPSULE | Freq: Two times a day (BID) | ORAL | Status: DC
  Administered 2012-02-20 – 2012-02-22 (×4): 100 mg via ORAL
  Filled 2012-02-20 (×5): qty 1

## 2012-02-20 NOTE — Progress Notes (Signed)
Patient is my primary-seen for support.  Events noted. Mentally clear, anxious regarding events and hospitalization.  Outlined findings, course and plans per cardiology.  Will follow. counselling.

## 2012-02-20 NOTE — Progress Notes (Signed)
Called to see pt because of combative confusion.  I spoke with Dr [facility] who had spoken with her this am and seen her confused yesterday.  According to daughter she was similarly confused yesterday but got better with the calming influence of her daughter.  According to DR Vickii Chafe note, he spoke with her for 30 minutes this am and was not confused  She currently is disoriented.  She ambulates without difficulty,speaks without difficulty.  Spoke with Dr RT, he would discontinue all of her psychotropic meds and will arrange for head CT

## 2012-02-20 NOTE — Progress Notes (Signed)
Patient was noted as having increasing delirium throughout the day and I was contacted per Dr. Graciela Husbands regarding this.  The patient had seen Dr. Jacky Kindle earlier in the day and had shown improvement.  All psychotropic meds d/ced, including Haldol, Lexapro and PRN Xanax.  Will repeat U/A as poor collection earlier this AM and will get a CT head without contrast to rule out a bleed as she is on Xarelto.  WIll reevaluate in the AM.

## 2012-02-20 NOTE — Progress Notes (Signed)
Pt combative, disoriented.  Daughters states pt went thru similar episode Monday night after Ambien was given.  Sitter at bedside.  MD notified; CT head ordered to r/o bleed.  Pt states she is not going thru with any test unless her lawyer approves.  Also, UA, Urine Cx ordered.  Will continue to monitor pt.

## 2012-02-20 NOTE — Progress Notes (Signed)
@   Subjective:  Denies CP or dyspnea; patient's confusion had improved last PM per RN; mildly confused this AM   Objective:  Filed Vitals:   02/20/12 0300 02/20/12 0400 02/20/12 0500 02/20/12 0600  BP: 111/61 115/91 97/58   Pulse:      Temp:  97.7 F (36.5 C)    TempSrc:  Oral    Resp:  20    Height:      Weight:      SpO2: 97% 96% 96% 95%    Intake/Output from previous day:  Intake/Output Summary (Last 24 hours) at 02/20/12 0710 Last data filed at 02/20/12 0600  Gross per 24 hour  Intake 1014.5 ml  Output    100 ml  Net  914.5 ml    Physical Exam: Physical exam: Well-developed well-nourished in no acute distress.  Skin is warm and dry.  HEENT is normal.  Neck is supple.  Chest is clear to auscultation with normal expansion.  Cardiovascular exam is tachycardic and irregular Abdominal exam nontender or distended. No masses palpated. Right groin with no hematoma and no bruit Extremities show no edema. neuro mildly confused; moves all ext; no gross neuro findings    Lab Results: Basic Metabolic Panel:  Basename 02/20/12 0530 02/19/12 0350  NA 139 139  K 3.8 3.9  CL 104 102  CO2 27 24  GLUCOSE 113* 130*  BUN 18 16  CREATININE 0.85 0.71  CALCIUM 8.8 9.1  MG -- 2.2  PHOS -- 3.6   CBC:  Basename 02/20/12 0530 02/19/12 0350 02/18/12 1536  WBC 11.2* 14.3* --  NEUTROABS -- -- 11.7*  HGB 11.4* 12.5 --  HCT 35.8* 37.9 --  MCV 90.2 89.0 --  PLT 303 320 --   Cardiac Enzymes:  Basename 02/19/12 1050 02/19/12 0350 02/18/12 2049  CKTOTAL -- -- --  CKMB -- -- --  CKMBINDEX -- -- --  TROPONINI 2.91* 2.60* 5.73*     Assessment/Plan:  1) NSTEMI - Appears to have been caused by embolic event from atrial fibrillation; discontinue ASA; continue lopressor and statin. 2) Atrial fibrillation - Continue cardizem but change to po; increase lopressor to 50 mg po BID; may need digoxin if rate continues to be elevated as BP borderline; continue xeralto; would like to  avoid DCCV at this point as she has had a recent embolic event to LAD. 3) URI - continue azithromycin. 4) Cardiomyopathy - continue lopressor and change to toprol later; no ACEI for now as BP borderline and AV nodal blocking agents are being increased for atrial fibrillation. Mild hypoxia; lasix 20 mg po x 1. 5) Confusion/delerium - improved this AM but persists; most likely sundowning. Transfer to telemetry  [name] 02/20/2012, 7:10 AM

## 2012-02-21 LAB — CBC
MCH: 29 pg (ref 26.0–34.0)
MCHC: 32.3 g/dL (ref 30.0–36.0)
MCV: 89.7 fL (ref 78.0–100.0)
Platelets: 303 10*3/uL (ref 150–400)
RBC: 3.97 MIL/uL (ref 3.87–5.11)
RDW: 13.6 % (ref 11.5–15.5)

## 2012-02-21 LAB — BASIC METABOLIC PANEL
Calcium: 9 mg/dL (ref 8.4–10.5)
Creatinine, Ser: 0.85 mg/dL (ref 0.50–1.10)
GFR calc Af Amer: 69 mL/min — ABNORMAL LOW (ref >90)
GFR calc non Af Amer: 59 mL/min — ABNORMAL LOW (ref >90)
Sodium: 140 mEq/L (ref 135–145)

## 2012-02-21 MED ORDER — DILTIAZEM HCL 60 MG PO TABS
60.0000 mg | ORAL_TABLET | Freq: Four times a day (QID) | ORAL | Status: DC
  Filled 2012-02-21 (×4): qty 1

## 2012-02-21 MED ORDER — RISPERIDONE 0.5 MG PO TABS
0.5000 mg | ORAL_TABLET | Freq: Two times a day (BID) | ORAL | Status: DC
  Administered 2012-02-21 – 2012-02-22 (×3): 0.5 mg via ORAL
  Filled 2012-02-21 (×4): qty 1

## 2012-02-21 MED ORDER — DILTIAZEM HCL 30 MG PO TABS
30.0000 mg | ORAL_TABLET | Freq: Once | ORAL | Status: AC
  Administered 2012-02-21: 30 mg via ORAL
  Filled 2012-02-21: qty 1

## 2012-02-21 MED ORDER — DIGOXIN 0.25 MG/ML IJ SOLN
0.2500 mg | Freq: Four times a day (QID) | INTRAMUSCULAR | Status: AC
  Administered 2012-02-21: 0.25 mg via INTRAVENOUS
  Filled 2012-02-21: qty 1

## 2012-02-21 MED ORDER — DIGOXIN 0.25 MG/ML IJ SOLN
0.2500 mg | Freq: Once | INTRAMUSCULAR | Status: AC
  Administered 2012-02-21: 0.25 mg via INTRAVENOUS
  Filled 2012-02-21: qty 1

## 2012-02-21 MED ORDER — DIGOXIN 125 MCG PO TABS
0.1250 mg | ORAL_TABLET | Freq: Every day | ORAL | Status: DC
Start: 2012-02-22 — End: 2012-02-22
  Administered 2012-02-22: 0.125 mg via ORAL
  Filled 2012-02-21: qty 1

## 2012-02-21 MED ORDER — DILTIAZEM HCL 30 MG PO TABS
30.0000 mg | ORAL_TABLET | Freq: Four times a day (QID) | ORAL | Status: DC
  Administered 2012-02-21 – 2012-02-22 (×3): 30 mg via ORAL
  Filled 2012-02-21 (×7): qty 1

## 2012-02-21 MED ORDER — DIGOXIN 0.25 MG/ML IJ SOLN
0.2500 mg | Freq: Four times a day (QID) | INTRAMUSCULAR | Status: DC
  Filled 2012-02-21 (×3): qty 1

## 2012-02-21 NOTE — Progress Notes (Signed)
Subjective: On seen [name] today officially in more detail at she's had some difficulty with some decompensation from a cognitive standpoint.  She is clearly very delusional at this point even though she is bright alert and recognizes myself and the people around her.  She is clearly paranoid about what her daughter's shirt pulled up to".  She denies any chest pain or shortness of breath.  She denies any abdominal pain nausea or vomiting.  She is a minimal hacking cough and no urinary symptoms.  She has adamantly refused and CT of the brain and purposely discarded the urines that we were not able to look at this as well.  I spent about one hour with she and her daughters this morning at the bedside.  Objective: Vital signs in last 24 hours: Temp:  [98.1 F (36.7 C)-98.6 F (37 C)] 98.1 F (36.7 C) (09/12 0520) Pulse Rate:  [89-114] 114  (09/12 1058) Resp:  [16-18] 17  (09/12 0520) BP: (98-134)/(65-86) 121/86 mmHg (09/12 1058) SpO2:  [93 %-96 %] 94 % (09/12 0520) Weight:  [75.297 kg (166 lb)] 75.297 kg (166 lb) (09/12 0934) Weight change:   CBG (last 3)  No results found for this basename: GLUCAP:3 in the last 72 hours  Intake/Output from previous day: 09/11 0701 - 09/12 0700 In: 18 [I.V.:18] Out: -   Physical Exam: Patient is awake alert a bit anxious but very reasonable in her discussions we just delusional about some circumstances.  Good facial symmetry.  No JVD or bruits.  Lungs are clear.  Cardiovascular exam is tachycardic no obvious murmur.  Abdomen is benign.  No peripheral edema.  Neurologically she's nonlateralizing alert awake oriented but delusional   Lab Results:  Basename 02/21/12 0427 02/20/12 0530 02/19/12 0350  NA 140 139 --  K 3.6 3.8 --  CL 103 104 --  CO2 30 27 --  GLUCOSE 96 113* --  BUN 20 18 --  CREATININE 0.85 0.85 --  CALCIUM 9.0 8.8 --  MG -- -- 2.2  PHOS -- -- 3.6    Basename 02/18/12 1536  AST 50*  ALT 28  ALKPHOS 83  BILITOT 0.3  PROT 7.1    ALBUMIN 3.4*    Basename 02/21/12 0427 02/20/12 0530 02/18/12 1536  WBC 9.8 11.2* --  NEUTROABS -- -- 11.7*  HGB 11.5* 11.4* --  HCT 35.6* 35.8* --  MCV 89.7 90.2 --  PLT 303 303 --   Lab Results  Component Value Date   INR 1.07 02/18/2012    Basename 02/19/12 1050 02/19/12 0350 02/18/12 2049  CKTOTAL -- -- --  CKMB -- -- --  CKMBINDEX -- -- --  TROPONINI 2.91* 2.60* 5.73*    Basename 02/18/12 1536  TSH 3.790  T4TOTAL --  T3FREE --  THYROIDAB --   No results found for this basename: VITAMINB12:2,FOLATE:2,FERRITIN:2,TIBC:2,IRON:2,RETICCTPCT:2 in the last 72 hours  Studies/Results: No results found.   Assessment/Plan: #1 delirium with delusions likely a decompensation in perhaps a mild cognitive disorder as an outpatient.  Remote possibility of a small CNS event regardless treatment would be the same and studying her would be more harmful and more agitating been treating her symptomatically at this point following her.  Meds been looked at in simplified and Respirgard all added to the regimen.  She will benefit from a quick transition home rather than to the skilled unit to her home with around-the-clock care and I discussed this at length with both her daughters.  #2 non-STEMI status post  cardiac catheterization  #3 atrial fibrillation with rapid ventricular response cardiology working on rate control and on Zarontin 04 thromboembolic prevention  Of asked for sitters, and Respirgard all and will look for her sooner rather than later transition home despite the risks   LOS: 3 days   Zyhir Cappella A 02/21/2012, 12:33 PM

## 2012-02-21 NOTE — Progress Notes (Addendum)
Pt refusing to go down for CT and giving urine sample despite multiple attempts. Pt also refusing to stand for daily weight, bed weight is inaccurate.  Pt states"Just leave me alone". Will notify MD and continue to monitor.

## 2012-02-21 NOTE — Progress Notes (Signed)
Clinical Social Worker received referral for potential rehab placement.  CSW spoke with pt's dtr, Larita Fife, who stated they were not in need of rehab and were going to transfer pt home with [facility].  CSW not to sign on at this time, please re consult if needed.   Angelia Mould, MSW, [place] [phone]

## 2012-02-21 NOTE — Progress Notes (Signed)
@   Subjective:  Denies CP or dyspnea; remains confused and agitated at times   Objective:  Filed Vitals:   02/20/12 1457 02/20/12 2116 02/20/12 2316 02/21/12 0520  BP: 98/66 134/84 101/65 121/81  Pulse: 101 113 97 89  Temp:  98.6 F (37 C)  98.1 F (36.7 C)  TempSrc:  Oral  Oral  Resp:  16  17  Height:      Weight:      SpO2:  96%  94%    Intake/Output from previous day:  Intake/Output Summary (Last 24 hours) at 02/21/12 0800 Last data filed at 02/20/12 2130  Gross per 24 hour  Intake      3 ml  Output      0 ml  Net      3 ml    Physical Exam: Physical exam: Well-developed well-nourished in no acute distress.  Skin is warm and dry.  HEENT is normal.  Neck is supple.  Chest is clear to auscultation with normal expansion.  Cardiovascular exam is tachycardic and irregular Abdominal exam nontender or distended. No masses palpated. Right groin with no hematoma and no bruit Extremities show no edema. neuro remains mildly confused; moves all ext; no gross neuro findings    Lab Results: Basic Metabolic Panel:  Basename 02/21/12 0427 02/20/12 0530 02/19/12 0350  NA 140 139 --  K 3.6 3.8 --  CL 103 104 --  CO2 30 27 --  GLUCOSE 96 113* --  BUN 20 18 --  CREATININE 0.85 0.85 --  CALCIUM 9.0 8.8 --  MG -- -- 2.2  PHOS -- -- 3.6   CBC:  Basename 02/21/12 0427 02/20/12 0530 02/18/12 1536  WBC 9.8 11.2* --  NEUTROABS -- -- 11.7*  HGB 11.5* 11.4* --  HCT 35.6* 35.8* --  MCV 89.7 90.2 --  PLT 303 303 --   Cardiac Enzymes:  Basename 02/19/12 1050 02/19/12 0350 02/18/12 2049  CKTOTAL -- -- --  CKMB -- -- --  CKMBINDEX -- -- --  TROPONINI 2.91* 2.60* 5.73*     Assessment/Plan:  1) NSTEMI - Appears to have been caused by embolic event from atrial fibrillation; continue lopressor and statin. 2) Atrial fibrillation - Continue cardizem but increase to 60 mg po q 6 as HR remains elevated; continue lopressor 50 mg po BID; may need digoxin if rate continues to be  elevated as BP borderline; continue xeralto; would like to avoid DCCV at this point as she has had a recent embolic event to LAD. 3) URI - complete azithromycin. 4) Cardiomyopathy - continue lopressor and change to toprol later; no ACEI for now as BP borderline and AV nodal blocking agents are being increased for atrial fibrillation.  5) Confusion/delerium - continues to be a significant issue; most likely sundowning. No gross neuro findings; would like to proceed with head CT but she is refusing; Dr Jacky Kindle is assisting with management. Hopefully can DC soon.   [name] 02/21/2012, 8:00 AM

## 2012-02-21 NOTE — Progress Notes (Signed)
Patient's blood pressure is 96/63 spoke to Dr. Jens Som he stated to give a one time dose of 30 mg Cardizem po now.  Order entered

## 2012-02-22 LAB — CBC
HCT: 36.8 % (ref 36.0–46.0)
MCHC: 32.9 g/dL (ref 30.0–36.0)
MCV: 88.9 fL (ref 78.0–100.0)
RDW: 13.4 % (ref 11.5–15.5)

## 2012-02-22 MED ORDER — METOPROLOL SUCCINATE ER 100 MG PO TB24: 100.0000 mg | ORAL_TABLET | Freq: Every day | ORAL | Status: DC

## 2012-02-22 MED ORDER — RIVAROXABAN 15 MG PO TABS: 15.0000 mg | ORAL_TABLET | Freq: Every day | ORAL | Status: DC

## 2012-02-22 MED ORDER — RISPERIDONE 0.5 MG PO TABS: 0.5000 mg | ORAL_TABLET | Freq: Two times a day (BID) | ORAL | Status: DC

## 2012-02-22 MED ORDER — ATORVASTATIN CALCIUM 40 MG PO TABS: 40.0000 mg | ORAL_TABLET | Freq: Every day | ORAL | Status: DC

## 2012-02-22 MED ORDER — DIGOXIN 125 MCG PO TABS: 0.1250 mg | ORAL_TABLET | Freq: Every day | ORAL | Status: DC

## 2012-02-22 MED ORDER — DILTIAZEM HCL ER COATED BEADS 120 MG PO CP24: 120.0000 mg | ORAL_CAPSULE | Freq: Every day | ORAL | Status: DC

## 2012-02-22 NOTE — Discharge Summary (Signed)
DISCHARGE SUMMARY  [name] [facility]  MR#: [id number]  DOB:1921/10/09  Date of Admission: 02/18/2012 Date of Discharge: 02/22/2012  Attending Physician:Baylynn Shifflett A  Patient's PCP:No primary provider on file.  Consults:Treatment Team:  Rounding Lbcardiology, MD Minda Meo, MD  Discharge Diagnoses: Active Problems:  Atrial fibrillation  Non-ST elevation myocardial infarction (NSTEMI) Acute delirium with paranoia resolving Essential hypertension Hyperlipidemia  Discharge Medications:   Medication List     As of 02/22/2012  7:38 AM    STOP taking these medications         ASPIRIN PO      NIFEdipine 30 MG 24 hr tablet   Commonly known as: PROCARDIA-XL/ADALAT-CC/NIFEDICAL-XL      temazepam 30 MG capsule   Commonly known as: RESTORIL      TAKE these medications         atorvastatin 40 MG tablet   Commonly known as: LIPITOR   Take 1 tablet (40 mg total) by mouth daily at 6 PM.      digoxin 0.125 MG tablet   Commonly known as: LANOXIN   Take 1 tablet (0.125 mg total) by mouth daily.      diltiazem 120 MG 24 hr capsule   Commonly known as: CARDIZEM CD   Take 1 capsule (120 mg total) by mouth daily.      escitalopram 10 MG tablet   Commonly known as: LEXAPRO   Take 10 mg by mouth daily.      metoprolol succinate 100 MG 24 hr tablet   Commonly known as: TOPROL-XL   Take 1 tablet (100 mg total) by mouth daily. Take with or immediately following a meal.      risperiDONE 0.5 MG tablet   Commonly known as: RISPERDAL   Take 1 tablet (0.5 mg total) by mouth 2 (two) times daily.      Rivaroxaban 15 MG Tabs tablet   Commonly known as: XARELTO   Take 1 tablet (15 mg total) by mouth daily with supper.        Hospital Procedures: Dg Chest Portable 1 View  02/18/2012  *RADIOLOGY REPORT*  Clinical Data: Cough and congestion  PORTABLE CHEST - 1 VIEW  Comparison: None.  Findings: 1720 hours.  Lung volumes are low. The cardiopericardial silhouette is enlarged.   There is mild vascular congestion without pulmonary edema.  No focal airspace consolidation. Imaged bony structures of the thorax are intact. Telemetry leads overlie the chest.  IMPRESSION: Cardiomegaly with vascular congestion.   Original Report Authenticated By: ERIC A. MANSELL, M.D.     History of Present Illness:  78 year old female with no prior cardiac history admitted with atrial fibrillation and non-ST elevation myocardial infarction. Patient typically does not have significant dyspnea on exertion, orthopnea, PND, pedal edema, palpitations, syncope or exertional chest pain. Over the past 10 days she has described fatigue. She has also noticed a cough productive of yellowish-white sputum. She denies dyspnea or chest pain. Her nurse checked her pulse today and noted that she was tachycardic and she was sent to the emergency room.     Hospital Course: Patient was admitted to the cardiology service emergently out of the emergency room placed on IV Cardizem and IV heparin. Ultimately she was taken to the cardiac Cath Lab which is noted to have a distal embolus to her LAD and some mild hypokinesis with ejection fraction 45%. He was not found to be any obstructive coronary disease it was felt that this is an embolic event from her atrial fibrillation.  She did well initially post procedure and ultimately from cardiac standpoint has had no evidence of decompensation and medical intervention directed at anticoagulation with xarelto and rate control using digoxin, metoprolol and cardia stem. From this standpoint resting she is in the 80s and with exertion she is getting up to the low 100s. Most importantly complicating this is been an acute delirium with delusions. Dr. Jens Som and I've spent considerable time with she and family sorting this out and try to calm her down. I did initiate respiratory with some success as she is improved but still remains a bit delusional but much less agitated. She has been  ambulatory and has been with sitter with good safety. She's been eating well. I did discuss at length with the daughters concerns about urinary infections and/or CNS vascular events or bleeds but given her agitation delusions we're unable to O. checking her urine and she is adequate refused any scans. I did discuss the fact with the family that given her circumstances, age hypertension really should be focused on palliative interventions and fulfilling her wishes if we can do so with some safety. I have conceded on discharge a bit earlier than anticipated in hopes of stabilizing her cognitively back to her home environment and the family has arranged round-the-clock sitters. We'll continue to monitor her weight and adjust meds at a distance this way and hope that by returning home understanding the risks we can better improve her mentation and restabilize her. She is relatively stable from a rate standpoint has no overt signs of bleeding. She clearly has overt signs of infection. I do suspect as I explained to the daughters she probably has some mild cognitive changes that we have not detected in the home environment that of decompensated the hospital apartment. Day of Discharge Exam BP 138/84  Pulse 86  Temp 97.9 F (36.6 C) (Oral)  Resp 17  Ht 5\' 6"  (1.676 m)  Wt 76.885 kg (169 lb 8 oz)  BMI 27.36 kg/m2  SpO2 97%  Physical Exam:  Patient is awake alert ambulating with assistance conversing quite normally but she range a bit delusional in some aspects. She clearly recognizes me and is upbeat happy content with my visit and trusting. She has absolutely no trust for cone or the staff all of which aren't founded. Lungs are clear bilaterally. She is good facial symmetry cranial nerves are intact. No JVD or bruits. Cardiovascular exam is irregular rate currently in the 80-90 range resting in bed no overt murmur. Abdomen is soft and nontender bowel sounds normal. Extremities no edema calves soft and  nontender negative Homans sign. Neurologically she clearly knows herself knows me knows that she is in cone confused regarding some of the circumstances. Motor is 5 out of 5. Cranial nerves intact. Discharge Labs:  [facility] 02/21/12 0427 02/20/12 0530  NA 140 139  K 3.6 3.8  CL 103 104  CO2 30 27  GLUCOSE 96 113*  BUN 20 18  CREATININE 0.85 0.85  CALCIUM 9.0 8.8  MG -- --  PHOS -- --   No results found for this basename: AST:2,ALT:2,ALKPHOS:2,BILITOT:2,PROT:2,ALBUMIN:2 in the last 72 hours  Basename 02/22/12 0510 02/21/12 0427  WBC 8.3 9.8  NEUTROABS -- --  HGB 12.1 11.5*  HCT 36.8 35.6*  MCV 88.9 89.7  PLT 315 303    Basename 02/19/12 1050  CKTOTAL --  CKMB --  CKMBINDEX --  TROPONINI 2.91*   No results found for this basename: TSH,T4TOTAL,FREET3,T3FREE,THYROIDAB in the last 72  hours No results found for this basename: VITAMINB12:2,FOLATE:2,FERRITIN:2,TIBC:2,IRON:2,RETICCTPCT:2 in the last 72 hours  Discharge instructions:     Discharge Orders    Future Orders Please Complete By Expires   Diet - low sodium heart healthy      Increase activity slowly         Disposition:*Home with daughter's round-the-clock care Follow-up Appts: Follow-up with Dr. Jacky Kindle at [facility] in *one week  Call for appointment.  Condition on Discharge: Stable but guarded Tests Needing Follow-up: We will follow her blood pressure and rate at home and the home environment to see her in the office in one week try and keep in the home environment back and routine is much as possible. Also adjust her respiratory further in the outpatient setting Signed: Gaelle Adriance A 02/22/2012, 7:38 AM

## 2012-02-22 NOTE — Progress Notes (Signed)
Ambulating O2 sat on RA was 92 %.

## 2012-02-22 NOTE — ED Provider Notes (Signed)
I saw and evaluated the patient, reviewed the resident's note and I agree with the findings and plan.   Patient seen by me along with resident, presents with rapid afib requiring rate control with Cardizem, which gradually brought her rate down. Patient denies chest pain but has no tbeen feeling right for a few days. Tn consistent with MI, NonSTEMI by EKG. Will need admission and heparin. Cardiology to admit.  CRITICAL CARE Performed by: Shelda Jakes.   Total critical care time: 30  Critical care time was exclusive of separately billable procedures and treating other patients.  Critical care was necessary to treat or prevent imminent or life-threatening deterioration.  Critical care was time spent personally by me on the following activities: development of treatment plan with patient and/or surrogate as well as nursing, discussions with consultants, evaluation of patient's response to treatment, examination of patient, obtaining history from patient or surrogate, ordering and performing treatments and interventions, ordering and review of laboratory studies, ordering and review of radiographic studies, pulse oximetry and re-evaluation of patient's condition.   Shelda Jakes, MD 02/22/12 1115

## 2012-03-05 ENCOUNTER — Encounter: Payer: Self-pay | Admitting: *Deleted

## 2012-03-05 ENCOUNTER — Encounter: Payer: Self-pay | Admitting: Cardiology

## 2012-03-05 DIAGNOSIS — I10 Essential (primary) hypertension: Secondary | ICD-10-CM | POA: Insufficient documentation

## 2012-03-05 DIAGNOSIS — K219 Gastro-esophageal reflux disease without esophagitis: Secondary | ICD-10-CM | POA: Insufficient documentation

## 2012-03-06 ENCOUNTER — Encounter: Payer: Self-pay | Admitting: Cardiology

## 2012-03-06 ENCOUNTER — Ambulatory Visit (INDEPENDENT_AMBULATORY_CARE_PROVIDER_SITE_OTHER): Payer: Medicare Other | Admitting: Cardiology

## 2012-03-06 VITALS — BP 153/81 | HR 65 | Wt 168.0 lb

## 2012-03-06 DIAGNOSIS — I214 Non-ST elevation (NSTEMI) myocardial infarction: Secondary | ICD-10-CM

## 2012-03-06 DIAGNOSIS — I4891 Unspecified atrial fibrillation: Secondary | ICD-10-CM

## 2012-03-06 DIAGNOSIS — I10 Essential (primary) hypertension: Secondary | ICD-10-CM

## 2012-03-06 MED ORDER — LISINOPRIL 2.5 MG PO TABS: 2.5000 mg | ORAL_TABLET | Freq: Every day | ORAL | Status: DC

## 2012-03-06 NOTE — Patient Instructions (Addendum)
Your physician recommends that you schedule a follow-up appointment in: 8 WEEKS WITH DR CRENSHAW   START LISINOPRIL 2.5 MG ONCE DAILY  Your physician recommends that you return for lab work in: ONE WEEK

## 2012-03-06 NOTE — Addendum Note (Signed)
Addended by: Freddi Starr on: 03/06/2012 02:55 PM   Modules accepted: Orders

## 2012-03-06 NOTE — Addendum Note (Signed)
Addended by: Freddi Starr on: 03/06/2012 02:53 PM   Modules accepted: Orders

## 2012-03-06 NOTE — Assessment & Plan Note (Addendum)
Blood pressure mildly elevated. Given recent infarct, reduced LV function we'll add lisinopril 2.5 mg daily. Check potassium and renal function in one week.

## 2012-03-06 NOTE — Progress Notes (Signed)
   HPI: 78 year old female for followup of atrial fibrillation. Patient admitted in September of 2013 with new onset atrial fibrillation. She will be in for a non-ST elevation myocardial infarction. She underwent cardiac catheterization in September of 2013 which revealed A. Normal left main, a large filling defect in the apical portion of the LAD felt suggestive of an embolus. There was no disease in the circumflex or right coronary artery. Ejection fraction was 45% and there was akinesis of the distal inferior wall and LV apex. Echocardiogram in March of 2013 showed an ejection fraction of 30%, mild biatrial enlargement, mild mitral regurgitation and moderate tricuspid regurgitation. The patient was treated with rate control and anticoagulation. Note TSH normal. Note she developed severe confusion in the hospital. Since discharge, she has some weakness but denies chest pain, dyspnea, bleeding, palpitations or syncope.  Current Outpatient Prescriptions  Medication Sig Dispense Refill  . atorvastatin (LIPITOR) 40 MG tablet Take 1 tablet (40 mg total) by mouth daily at 6 PM.  30 tablet  5  . digoxin (LANOXIN) 0.125 MG tablet Take 1 tablet (0.125 mg total) by mouth daily.  30 tablet  5  . diltiazem (CARDIZEM CD) 120 MG 24 hr capsule Take 1 capsule (120 mg total) by mouth daily.  30 capsule  5  . escitalopram (LEXAPRO) 10 MG tablet Take 10 mg by mouth daily.      . metoprolol succinate (TOPROL XL) 100 MG 24 hr tablet Take 1 tablet (100 mg total) by mouth daily. Take with or immediately following a meal.  30 tablet  5  . risperiDONE (RISPERDAL) 0.5 MG tablet Take 1 tablet (0.5 mg total) by mouth 2 (two) times daily.  60 tablet  5  . Rivaroxaban (XARELTO) 15 MG TABS tablet Take 1 tablet (15 mg total) by mouth daily with supper.  30 tablet  5     Past Medical History  Diagnosis Date  . Hypertension   . Gastroesophageal reflux disease   . Atrial fibrillation   . Non-ST elevation myocardial infarction  (NSTEMI)     Past Surgical History  Procedure Date  . Abdominal hysterectomy   . Appendectomy     History   Social History  . Marital Status: Widowed    Spouse Name: N/A    Number of Children: N/A  . Years of Education: N/A   Occupational History  . Not on file.   Social History Main Topics  . Smoking status: Former Games developer  . Smokeless tobacco: Not on file  . Alcohol Use: Yes  . Drug Use: Not on file  . Sexually Active: Not on file   Other Topics Concern  . Not on file   Social History Narrative  . No narrative on file    ROS: no fevers or chills, productive cough, hemoptysis, dysphasia, odynophagia, melena, hematochezia, dysuria, hematuria, rash, seizure activity, orthopnea, PND, pedal edema, claudication. Remaining systems are negative.  Physical Exam: Well-developed well-nourished in no acute distress.  Skin is warm and dry.  HEENT is normal.  Neck is supple.  Chest is clear to auscultation with normal expansion.  Cardiovascular exam is irregular Abdominal exam nontender or distended. No masses palpated. Extremities show no edema. neuro grossly intact  ECG atrial fibrillation at a rate of 65. Left axis deviation. Anterior and inferior T-wave inversion.

## 2012-03-06 NOTE — Assessment & Plan Note (Signed)
Recent infarct related to embolic event to LAD because of atrial fibrillation. Continue anticoagulation.

## 2012-03-06 NOTE — Assessment & Plan Note (Addendum)
Patient remains in atrial fibrillation. Continue present medications for rate control. She had an embolic event to her LAD that caused an infarct. Continue xeralto. Check CBC and renal function in one week. We will decide in 8 weeks whether to pursue cardioversion. If her weakness has improved the best option may be rate control and anticoagulation as we will not be able to discontinue anticoagulation in the future given history of embolic event.

## 2012-03-07 ENCOUNTER — Telehealth: Payer: Self-pay | Admitting: Cardiology

## 2012-03-07 NOTE — Telephone Encounter (Signed)
Left message for pt dtr to call  

## 2012-03-07 NOTE — Telephone Encounter (Signed)
plz return call to patient daughter Dewayne Hatch [phone] regarding questions about medical care.

## 2012-03-13 ENCOUNTER — Other Ambulatory Visit (INDEPENDENT_AMBULATORY_CARE_PROVIDER_SITE_OTHER): Payer: Medicare Other

## 2012-03-13 DIAGNOSIS — I214 Non-ST elevation (NSTEMI) myocardial infarction: Secondary | ICD-10-CM

## 2012-03-13 DIAGNOSIS — I10 Essential (primary) hypertension: Secondary | ICD-10-CM

## 2012-03-13 DIAGNOSIS — I4891 Unspecified atrial fibrillation: Secondary | ICD-10-CM

## 2012-03-13 LAB — BASIC METABOLIC PANEL
BUN: 16 mg/dL (ref 6–23)
Creatinine, Ser: 0.9 mg/dL (ref 0.4–1.2)
GFR: 67.02 mL/min (ref >60.00)

## 2012-03-13 LAB — CBC WITH DIFFERENTIAL/PLATELET
Basophils Absolute: 0 10*3/uL (ref 0.0–0.1)
Eosinophils Relative: 1.8 % (ref 0.0–5.0)
MCV: 88.4 fl (ref 78.0–100.0)
Monocytes Absolute: 0.4 10*3/uL (ref 0.1–1.0)
Monocytes Relative: 6.2 % (ref 3.0–12.0)
Neutrophils Relative %: 66.9 % (ref 43.0–77.0)
Platelets: 238 10*3/uL (ref 150.0–400.0)
WBC: 6.9 10*3/uL (ref 4.5–10.5)

## 2012-04-29 ENCOUNTER — Ambulatory Visit (INDEPENDENT_AMBULATORY_CARE_PROVIDER_SITE_OTHER): Payer: Medicare Other | Admitting: Cardiology

## 2012-04-29 ENCOUNTER — Encounter: Payer: Self-pay | Admitting: Cardiology

## 2012-04-29 VITALS — BP 179/103 | HR 77 | Ht 66.0 in | Wt 166.0 lb

## 2012-04-29 DIAGNOSIS — I4891 Unspecified atrial fibrillation: Secondary | ICD-10-CM

## 2012-04-29 DIAGNOSIS — I10 Essential (primary) hypertension: Secondary | ICD-10-CM

## 2012-04-29 DIAGNOSIS — I214 Non-ST elevation (NSTEMI) myocardial infarction: Secondary | ICD-10-CM

## 2012-04-29 MED ORDER — LISINOPRIL 20 MG PO TABS: 20.0000 mg | ORAL_TABLET | Freq: Every day | ORAL | Status: DC

## 2012-04-29 NOTE — Assessment & Plan Note (Signed)
Previous infarct felt related to embolic event to her LAD.

## 2012-04-29 NOTE — Assessment & Plan Note (Signed)
Blood pressure elevated. Increase lisinopril to 20 mg daily. Check potassium and renal function in one week. 

## 2012-04-29 NOTE — Assessment & Plan Note (Signed)
Patient remains in atrial fibrillation. Her rate is controlled and I will continue digoxin, Cardizem and beta blocker. Continue xeralto as she had a previous embolic event to her LAD. She did fall recently tripping over a curb. We discussed carrying a cane and the potential problems with falling. I would be hesitant to ever discontinue anticoagulation given her previous embolic event.

## 2012-04-29 NOTE — Progress Notes (Signed)
HPI: 78 year old female for followup of atrial fibrillation. Patient admitted in September of 2013 with new onset atrial fibrillation. She had a non-ST elevation myocardial infarction. She underwent cardiac catheterization in September of 2013 which revealed normal left main, a large filling defect in the apical portion of the LAD felt suggestive of an embolus. There was no disease in the circumflex or right coronary artery. Ejection fraction was 45% and there was akinesis of the distal inferior wall and LV apex. Echocardiogram in March of 2013 showed an ejection fraction of 30%, mild biatrial enlargement, mild mitral regurgitation and moderate tricuspid regurgitation. The patient was treated with rate control and anticoagulation. Note TSH normal. Note she developed severe confusion in the hospital and has some dementia. Since I last saw her in Sept 2013, she denies dyspnea, chest pain, palpitations, syncope, pedal edema or bleeding.   Current Outpatient Prescriptions  Medication Sig Dispense Refill  . atorvastatin (LIPITOR) 40 MG tablet Take 1 tablet (40 mg total) by mouth daily at 6 PM.  30 tablet  5  . digoxin (LANOXIN) 0.125 MG tablet Take 1 tablet (0.125 mg total) by mouth daily.  30 tablet  5  . diltiazem (CARDIZEM CD) 120 MG 24 hr capsule Take 1 capsule (120 mg total) by mouth daily.  30 capsule  5  . escitalopram (LEXAPRO) 10 MG tablet Take 10 mg by mouth daily.      Marland Kitchen lisinopril (PRINIVIL,ZESTRIL) 2.5 MG tablet Take 1 tablet (2.5 mg total) by mouth daily.  30 tablet  12  . metoprolol succinate (TOPROL XL) 100 MG 24 hr tablet Take 1 tablet (100 mg total) by mouth daily. Take with or immediately following a meal.  30 tablet  5  . Omeprazole (PRILOSEC PO) Take 1 tablet by mouth daily as needed.      . Probiotic Product (ALIGN PO) Take 1 tablet by mouth daily.      . Rivaroxaban (XARELTO) 15 MG TABS tablet Take 1 tablet (15 mg total) by mouth daily with supper.  30 tablet  5  . risperiDONE  (RISPERDAL) 0.5 MG tablet Take 1 tablet (0.5 mg total) by mouth 2 (two) times daily.  60 tablet  5     Past Medical History  Diagnosis Date  . Hypertension   . Gastroesophageal reflux disease   . Atrial fibrillation   . Non-ST elevation myocardial infarction (NSTEMI)     Past Surgical History  Procedure Date  . Abdominal hysterectomy   . Appendectomy     History   Social History  . Marital Status: Widowed    Spouse Name: N/A    Number of Children: N/A  . Years of Education: N/A   Occupational History  . Not on file.   Social History Main Topics  . Smoking status: Former Games developer  . Smokeless tobacco: Not on file  . Alcohol Use: Yes  . Drug Use: Not on file  . Sexually Active: Not on file   Other Topics Concern  . Not on file   Social History Narrative  . No narrative on file    ROS: no fevers or chills, productive cough, hemoptysis, dysphasia, odynophagia, melena, hematochezia, dysuria, hematuria, rash, seizure activity, orthopnea, PND, pedal edema, claudication. Remaining systems are negative.  Physical Exam: Well-developed well-nourished in no acute distress.  Skin is warm and dry.  HEENT is normal.  Neck is supple.  Chest is clear to auscultation with normal expansion.  Cardiovascular exam is irregular Abdominal exam nontender or distended.  No masses palpated. Extremities show no edema. neuro grossly intact  ECG Atrial fibrillation, inferior and lateral T-wave inversion.

## 2012-04-29 NOTE — Patient Instructions (Addendum)
Your physician wants you to follow-up in: 6 MONTHS WITH DR Jens Som You will receive a reminder letter in the mail two months in advance. If you don't receive a letter, please call our office to schedule the follow-up appointment.   INCREASE LISINOPRIL TO 20 MG ONCE DAILY  Your physician recommends that you return for lab work in: ONE WEEK

## 2012-04-29 NOTE — Addendum Note (Signed)
Addended by: Freddi Starr on: 04/29/2012 03:41 PM   Modules accepted: Orders

## 2012-05-06 ENCOUNTER — Other Ambulatory Visit: Payer: Medicare Other

## 2012-05-29 ENCOUNTER — Encounter: Payer: Self-pay | Admitting: *Deleted

## 2012-06-13 ENCOUNTER — Other Ambulatory Visit (INDEPENDENT_AMBULATORY_CARE_PROVIDER_SITE_OTHER): Payer: Medicare Other

## 2012-06-13 DIAGNOSIS — I10 Essential (primary) hypertension: Secondary | ICD-10-CM

## 2012-06-13 LAB — BASIC METABOLIC PANEL
GFR: 62.71 mL/min (ref >60.00)
Glucose, Bld: 86 mg/dL (ref 70–99)
Potassium: 4.1 mEq/L (ref 3.5–5.1)
Sodium: 141 mEq/L (ref 135–145)

## 2012-10-28 ENCOUNTER — Ambulatory Visit: Payer: Medicare Other | Admitting: Cardiology

## 2012-11-09 LAB — CBC AND DIFFERENTIAL
HCT: 39 % (ref 36–46)
Hemoglobin: 13 g/dL (ref 12.0–16.0)
Platelets: 227 10*3/uL (ref 150–399)

## 2012-11-09 LAB — BASIC METABOLIC PANEL
BUN: 13 mg/dL (ref 4–21)
Creatinine: 0.7 mg/dL (ref 0.5–1.1)
Glucose: 95 mg/dL
Potassium: 4 mmol/L (ref 3.4–5.3)
Potassium: 4.9 mmol/L (ref 3.4–5.3)
SODIUM: 144 mmol/L (ref 137–147)

## 2012-11-09 LAB — HEPATIC FUNCTION PANEL
ALT: 18 U/L (ref 7–35)
AST: 21 U/L (ref 13–35)
Alkaline Phosphatase: 61 U/L (ref 25–125)

## 2012-11-10 ENCOUNTER — Non-Acute Institutional Stay (SKILLED_NURSING_FACILITY): Payer: Medicare Other | Admitting: Geriatric Medicine

## 2012-11-10 ENCOUNTER — Encounter: Payer: Self-pay | Admitting: Geriatric Medicine

## 2012-11-10 DIAGNOSIS — F0280 Dementia in other diseases classified elsewhere without behavioral disturbance: Secondary | ICD-10-CM

## 2012-11-10 DIAGNOSIS — G309 Alzheimer's disease, unspecified: Secondary | ICD-10-CM | POA: Insufficient documentation

## 2012-11-10 DIAGNOSIS — I4891 Unspecified atrial fibrillation: Secondary | ICD-10-CM

## 2012-11-10 DIAGNOSIS — I10 Essential (primary) hypertension: Secondary | ICD-10-CM

## 2012-11-10 DIAGNOSIS — N390 Urinary tract infection, site not specified: Secondary | ICD-10-CM

## 2012-11-10 NOTE — Assessment & Plan Note (Addendum)
Patient with memory/cognitive deficits, probable Alzheimer's type dementia, moderate stage.  Transferred to health care section last week due to increased confusion and weakness. Laboratory studies are stable, urinalysis with evidence of infection. MMSE 13/ 30, failed clock test. Gait is unsteady, language skills intact. Independent with basic ADLS, requires assistance with higher functioning activities. Patient has been taking Namenda XR for some time. Also has been receiving a low dose of Depakote to help with mood stabilization, will increase this to BID dosing, may help with sleep. Patient has no insight into her memory/cognitive problems, is easily agitated. Plan is for discharge to her Independent Living home once 24-hour caregivers can be arranged. She will also have medication management through [facility].  Recommend followup with Dr. Jacky Kindle in several weeks.

## 2012-11-10 NOTE — Assessment & Plan Note (Signed)
Urine sent for analysis returned with evidence of infection, (large amount of blood and leukocyte esterase). Treatment started with Bactrim DS yesterday, will froward culture result to Dr.Aronson

## 2012-11-10 NOTE — Progress Notes (Signed)
Patient ID: [name], female   DOB: May 23, 1922, 79 y.o.   MRN: [id number] [facility] [phone])  Chief Complaint  Patient presents with  . Confusion, agitation    HPI: This 79 year old female resident of [facility], independent living section was transferred to the assisted living section on 11/05/2012 due to weakness and deconditioning. The patient's daughter had also expressed concern to the pt's PCP, Dr. Jacky Kindle, whether her Mom was appropriate for independent living setting. She requested admission for evaluation by PT/OT.  Patient was transferred to the rehabilitation section of [facility] on Saturday due to increased agitation and wandering. Labs her labs and urine for analysis were obtained, lab studies were normal, urinalysis was suspicious for infection. P.o. antibiotic was started yesterday. Today patient reports that she feels very well, she is up ambulating independently is independent with her ADLs. She is insisting that she wants to go home. She has declined PT and OT evaluation today. Patient's daughter has been in contact with the social worker here [facility], arrangements are in progress for 24-hour care at home.  Allergies  Allergies  Allergen Reactions  . Codeine   . Zolpidem Other (See Comments)    Becomes agitated and hallicuinates   Medications  Current outpatient prescriptions:aspirin 81 MG tablet, Take 81 mg by mouth daily., Disp: , Rfl: ;  Cholecalciferol (VITAMIN D) 1000 UNITS capsule, Take 1,000 Units by mouth daily., Disp: , Rfl: ;  diltiazem (CARDIZEM CD) 120 MG 24 hr capsule, Take 120 mg by mouth daily., Disp: , Rfl: ;  divalproex (DEPAKOTE) 125 MG DR tablet, Take 125 mg by mouth 2 (two) times daily., Disp: , Rfl:  Memantine HCl ER (NAMENDA XR) 28 MG CP24, Take 1 capsule by mouth daily., Disp: , Rfl: ;  metoprolol succinate (TOPROL XL) 100 MG 24 hr tablet, Take 1 tablet (100 mg total) by mouth daily. Take with or  immediately following a meal., Disp: 30 tablet, Rfl: 5;  Omeprazole (PRILOSEC PO), Take 1 tablet by mouth daily as needed., Disp: , Rfl:  sulfamethoxazole-trimethoprim (BACTRIM DS) 800-160 MG per tablet, Take 1 tablet by mouth 2 (two) times daily., Disp: , Rfl:   Data Reviewed    YQI:[id number], external 11/10/2012 WBC 8.9, hemoglobin 13.0, hematocrit 39.1, platelets 227   glucose 95, BUN 13, creatinine 0.70, sodium 144, potassium 4.0. LFTs/proteins WNL.  Urinalysis positive for turbid appearance large amount of blood large leukocyte esterase     PMH  Past Medical History  Diagnosis Date  . Gastroesophageal reflux disease   . Atrial fibrillation 2013    not candidate for anticoagulation  . Non-ST elevation myocardial infarction (NSTEMI)   . Alzheimer disease   . Endometrial hyperplasia with atypia 1995  . Personal history of colonic polyps 2000  . Hyperlipidemia   . Hypertension   . Macular degeneration   . Osteoarthritis   . Osteopenia   . Wrist fracture 1999  . NSTEMI (non-ST elevated myocardial infarction) 02/2012    S/P cardiac catherization complicated by delirium   Past Surgical History  Procedure Laterality Date  . Appendectomy  1977  . Abdominal hysterectomy  1995    TAH/BSO   FH  Family History  Problem Relation Age of Onset  . Stroke Mother     History   Social History Narrative   Widowed, 2 grown children . Resides at [facility] [place] since 2004. Has in home caregivers / medication management.    Former smoker, quit 1970s, drinks wine  daily   Advanced Directives: Living Will   Review of Systems  DATA OBTAINED: from patient, nurse, medical record GENERAL: Feels well No fevers, fatigue, change in appetite or weight SKIN: No itch, rash or open wounds EYES: No eye pain, dryness or itching  No change in vision EARS: No earache, tinnitus, change in hearing NOSE: No congestion, drainage or bleeding MOUTH/THROAT: No mouth, tooth pain or  sore throat  No difficulty chewing or swallowing RESPIRATORY: No cough, wheezing, SOB CARDIAC: No chest pain, palpitations  No edema. GI: No abdominal pain  No N/V/D or constipation  No heartburn or reflux  GU: No dysuria, frequency or urgency   MUSCULOSKELETAL: No joint pain, swelling or stiffness  No back pain  No muscle ache, pain, weakness  Gait is steady  No recent falls.  NEUROLOGIC: No dizziness, fainting, headache  Easily agitated, STML,( not new)  PSYCHIATRIC: No feelings of anxiety, depression Reports 'I haven't slept well in years..."   Physical Exam Filed Vitals:   11/10/12 1732  BP: 146/83  Pulse: 108  Temp: 97.2 F (36.2 C)  Resp: 18  Weight: 159 lb 12.8 oz (72.485 kg)  SpO2: 96%   GENERAL APPEARANCE: No acute distress, mildly dishevled, normal body habitus. Alert, conversant, easily agitated HEAD: Normocephalic, atraumatic EYES: Conjunctiva/lids clear. Pupils round, reactive.  EARS: External exam WNL Hearing Decreased NOSE: No deformity or discharge. MOUTH/THROAT: Lips w/o lesions. Oral mucosa, tongue moist, w/o lesion. Oropharynx w/o redness or lesions.  NECK: Supple, full ROM. No thyroid tenderness, enlargement or nodule LYMPHATICS: No head, neck or supraclavicular adenopathy RESPIRATORY: Breathing is even, unlabored. Lung sounds are clear and full.  CARDIOVASCULAR: Heart RRR. No murmur or extra heart sounds  ARTERIAL: No carotid bruit.  DP pulse 2+.  VENOUS: No varicosities. No venous stasis skin changes  EDEMA: No peripheral edema. GASTROINTESTINAL: Abdomen is soft, non-tender, not distended w/ normal bowel sounds.  MUSCULOSKELETAL: Moves all extremities with full ROM, strength and tone. Back is without kyphosis, scoliosis or spinal process tenderness. A bit wobbly when first rising, then gait is steady NEUROLOGIC: Not oriented to time, Oriented to place, person. Cranial nerves 2-12 grossly intact, speech clear, no tremor. PSYCHIATRIC: Easily distracted,  agitated  ASSESSMENT/PLAN  Alzheimer disease Patient with memory/cognitive deficits, probable Alzheimer's type dementia, moderate stage.  Transferred to health care section last week due to increased confusion and weakness. Laboratory studies are stable, urinalysis with evidence of infection. MMSE 13/ 30, failed clock test. Gait is unsteady, language skills intact. Independent with basic ADLS, requires assistance with higher functioning activities. Patient has been taking Namenda XR for some time. Also has been receiving a low dose of Depakote to help with mood stabilization, will increase this to BID dosing, may help with sleep. Patient has no insight into her memory/cognitive problems, is easily agitated. Plan is for discharge to her Independent Living home once 24-hour caregivers can be arranged. She will also have medication management through [facility].  Recommend followup with Dr. Jacky Kindle in several weeks.  Atrial fibrillation Heart rhythm is regular today, rate has been moderately well controlled during her health care stay (78-108). Patient continues Cardizem and metoprolol. Anticoagulation was stopped per Dr. Jacky Kindle due to patient's gait instability/ increased risk to fall as well as poor compliance with medications. She is receiving low-dose aspirin.  Hypertension BP has been reasonably well controlled while in Health care. Pt continues Diltiazem, metoprolol, lisinopril. Lab studies satisfactory  UTI (urinary tract infection) Urine sent for analysis returned with evidence  of infection, (large amount of blood and leukocyte esterase). Treatment started with Bactrim DS yesterday, will froward culture result to Dr.Aronson   >35 minutes spent with patient, nursing staff, social worker, telephone call with Dr. Suzzette Righter T.Jamilee Lafosse, NP-C 11/10/2012

## 2012-11-10 NOTE — Assessment & Plan Note (Signed)
BP has been reasonably well controlled while in Health care. Pt continues Diltiazem, metoprolol, lisinopril. Lab studies satisfactory

## 2012-11-10 NOTE — Assessment & Plan Note (Addendum)
Heart rhythm is regular today, rate has been moderately well controlled during her health care stay (78-108). Patient continues Cardizem and metoprolol. Anticoagulation was stopped per Dr. Jacky Kindle due to patient's gait instability/ increased risk to fall as well as poor compliance with medications. She is receiving low-dose aspirin.

## 2012-11-13 ENCOUNTER — Non-Acute Institutional Stay (SKILLED_NURSING_FACILITY): Payer: Medicare Other | Admitting: Geriatric Medicine

## 2012-11-13 DIAGNOSIS — I10 Essential (primary) hypertension: Secondary | ICD-10-CM

## 2012-11-13 DIAGNOSIS — N390 Urinary tract infection, site not specified: Secondary | ICD-10-CM

## 2012-11-13 DIAGNOSIS — G309 Alzheimer's disease, unspecified: Secondary | ICD-10-CM

## 2012-11-13 DIAGNOSIS — G4700 Insomnia, unspecified: Secondary | ICD-10-CM

## 2012-11-13 DIAGNOSIS — F0280 Dementia in other diseases classified elsewhere without behavioral disturbance: Secondary | ICD-10-CM

## 2012-11-13 NOTE — Progress Notes (Signed)
Patient ID: [name], female   DOB: 1922/06/06, 79 y.o.   MRN: [id number] [facility] [id number])  Chief Complaint  Patient presents with  . Rehab discharge    Rehab Discharge  . UTI, AD, weakness    HPI: This 79 year old female resident of [facility], Independent Living section was transferred to the Assisted Living section on 11/05/2012 due to weakness and deconditioning. The patient's daughter had also expressed concern to the pt's PCP, Dr. Jacky Kindle, whether her Mom was appropriate for independent living setting. She requested admission for evaluation by PT/OT. Patient was transferred to the rehabilitation section of [facility] on Saturday due to increased agitation and wandering. Labs  and urine for analysis were obtained. Lab studies were normal, urinalysis was suspicious for infection. P.o. antibiotic is in progress. Other findings include MMSE 13/ 30, failed clock test. Gait is unsteady, language skills intact. Independent with basic ADLS, requires assistance with higher functioning activities. Patient has been taking Namenda XR for some time. Also has been receiving a low dose of Depakote to help with mood stabilization, this was increased to BID dosing earlier this week. Patient has no insight into her memory/cognitive problems, is easily agitated. Patient declined PT/ OT evaluation earlier this week but indicates she is willing to have these services at home. Arrangements have been made for 24-hour caregivers in patient's Independent Living home. She will also have medication management through [facility]. Recommend followup with Dr. Jacky Kindle in 1-2 weeks   Allergies  Allergies  Allergen Reactions  . Codeine   . Zolpidem Other (See Comments)    Becomes agitated and hallicuinates   Medications Current outpatient prescriptions:aspirin 81 MG tablet, Take 81 mg by mouth daily., Disp: , Rfl: ;  Cholecalciferol (VITAMIN D) 1000 UNITS capsule, Take  1,000 Units by mouth daily., Disp: , Rfl: ;  diltiazem (CARDIZEM CD) 120 MG 24 hr capsule, Take 120 mg by mouth daily., Disp: , Rfl: ;  divalproex (DEPAKOTE) 125 MG DR tablet, Take 125 mg by mouth 2 (two) times daily., Disp: , Rfl:  lisinopril (PRINIVIL,ZESTRIL) 10 MG tablet, Take 10 mg by mouth daily., Disp: , Rfl: ;  Memantine HCl ER (NAMENDA XR) 28 MG CP24, Take 1 capsule by mouth daily., Disp: , Rfl: ;  metoprolol succinate (TOPROL XL) 100 MG 24 hr tablet, Take 1 tablet (100 mg total) by mouth daily. Take with or immediately following a meal., Disp: 30 tablet, Rfl: 5;  Omeprazole (PRILOSEC PO), Take 1 tablet by mouth daily as needed., Disp: , Rfl:  sulfamethoxazole-trimethoprim (BACTRIM DS) 800-160 MG per tablet, Take 1 tablet by mouth 2 (two) times daily., Disp: , Rfl: ;  Melatonin 3 MG TABS, Take 1 tablet (3 mg total) by mouth at bedtime., Disp: , Rfl: 0  Data Reviewed     JWJ:XBJYNWG, external  11/10/2012 WBC 8.9, hemoglobin 13.0, hematocrit 39.1, platelets 227   glucose 95, BUN 13, creatinine 0.70, sodium 144, potassium 4.0. LFTs/proteins WNL.   Urinalysis positive for turbid appearance large amount of blood large leukocyte esterase  Culture: >100,000 colonies  Review of Systems  DATA OBTAINED: from patient, nurse, medical record  GENERAL: Feels well No fevers, fatigue, Decreased  Appetite weight stable  SKIN: No itch, rash or open wounds  EYES: No eye pain, dryness or itching No change in vision  EARS: No earache, tinnitus, change in hearing  NOSE: No congestion, drainage or bleeding  MOUTH/THROAT: No mouth, tooth pain or sore throat No difficulty chewing or swallowing  RESPIRATORY: No cough, wheezing, SOB  CARDIAC: No chest pain, palpitations No edema.  GI: No abdominal pain No N/V/D or constipation No heartburn or reflux  GU: No dysuria, frequency or urgency  MUSCULOSKELETAL: No joint pain, swelling or stiffness No back pain No muscle ache, pain, weakness Gait is steady No recent  falls.  NEUROLOGIC: No dizziness, fainting, headache Easily agitated, STML,( not new)  PSYCHIATRIC: No feelings of anxiety, depression Reports 'I haven't slept well in years..."   Physical Exam Filed Vitals:   11/13/12 1630  BP: 139/77  Pulse: 105  Temp: 97.3 F (36.3 C)  Resp: 18  Weight: 159 lb 3.2 oz (72.213 kg)  SpO2: 95%   Body mass index is 25.71 kg/(m^2).  GENERAL APPEARANCE: No acute distress, mildly dishevled, normal body habitus. Alert, conversant, easily agitated  HEAD: Normocephalic, atraumatic  EYES: Conjunctiva/lids clear. Pupils round, reactive.  EARS: External exam WNL Hearing Decreased  NOSE: No deformity or discharge.  MOUTH/THROAT: Lips w/o lesions. Oral mucosa, tongue moist, w/o lesion. Oropharynx w/o redness or lesions.  NECK: Supple, full ROM. No thyroid tenderness, enlargement or nodule  LYMPHATICS: No head, neck or supraclavicular adenopathy  RESPIRATORY: Breathing is even, unlabored. Lung sounds are clear and full.  CARDIOVASCULAR: Heart RRR. No murmur or extra heart sounds  ARTERIAL: No carotid bruit. DP pulse 2+.  VENOUS: No varicosities. No venous stasis skin changes  EDEMA: No peripheral edema.  GASTROINTESTINAL: Abdomen is soft, non-tender, not distended w/ normal bowel sounds.  MUSCULOSKELETAL: Moves all extremities with full ROM, strength and tone. Back is without kyphosis, scoliosis or spinal process tenderness. A bit wobbly when first rising, then gait is steady  NEUROLOGIC: Not oriented to time, Oriented to place, person. Cranial nerves 2-12 grossly intact, speech clear, no tremor.  PSYCHIATRIC: Easily distracted, agitated  ASSESSMENT/PLAN  UTI (urinary tract infection) Patient continues p.o. antibiotic without adverse effects. No current urinary tract symptoms.  Urine culture is positive for 100,000 colonies, no sensitivities available today. Will continue to follow fax results to Dr. Jacky Kindle  Hypertension Her blood pressure readings have  remained stable during her rehabilitation stay. Continue current medications  Alzheimer disease Patient's memory and cognitive status require 24-hour care. At this point this care will be provided in her home. Continue current medications.  Insomnia Patient reports very poor sleep, once a sleeping pill. Hypnotic or sedatives not a good choice for this patient, will try melatonin.    Follow up: Dr Jacky Kindle 1-2 weeks  Laurence Folz T.Kaiyah Eber, NP-C 11/13/2012

## 2012-11-16 ENCOUNTER — Encounter: Payer: Self-pay | Admitting: Geriatric Medicine

## 2012-11-16 DIAGNOSIS — G4700 Insomnia, unspecified: Secondary | ICD-10-CM

## 2012-11-16 HISTORY — DX: Insomnia, unspecified: G47.00

## 2012-11-16 MED ORDER — MELATONIN 3 MG PO TABS: 1.0000 | ORAL_TABLET | Freq: Every day | ORAL | Status: DC

## 2012-11-16 NOTE — Assessment & Plan Note (Signed)
Her blood pressure readings have remained stable during her rehabilitation stay. Continue current medications

## 2012-11-16 NOTE — Assessment & Plan Note (Signed)
Patient reports very poor sleep, once a sleeping pill. Hypnotic or sedatives not a good choice for this patient, will try melatonin.

## 2012-11-16 NOTE — Assessment & Plan Note (Addendum)
Patient continues p.o. antibiotic without adverse effects. No current urinary tract symptoms.  Urine culture is positive for 100,000 colonies, no sensitivities available today. Will continue to follow fax results to Dr. Jacky Kindle

## 2012-11-16 NOTE — Assessment & Plan Note (Signed)
Patient's memory and cognitive status require 24-hour care. At this point this care will be provided in her home. Continue current medications.

## 2012-11-17 LAB — CBC AND DIFFERENTIAL: WBC: 8.9 10^3/mL

## 2012-12-01 ENCOUNTER — Non-Acute Institutional Stay: Payer: Medicare Other | Admitting: Internal Medicine

## 2012-12-01 ENCOUNTER — Encounter: Payer: Self-pay | Admitting: Internal Medicine

## 2012-12-01 VITALS — BP 116/72 | HR 74 | Temp 97.4°F | Ht 66.0 in | Wt 161.0 lb

## 2012-12-01 DIAGNOSIS — F5000 Anorexia nervosa, unspecified: Secondary | ICD-10-CM

## 2012-12-01 DIAGNOSIS — I10 Essential (primary) hypertension: Secondary | ICD-10-CM

## 2012-12-01 DIAGNOSIS — G4700 Insomnia, unspecified: Secondary | ICD-10-CM

## 2012-12-01 DIAGNOSIS — I4891 Unspecified atrial fibrillation: Secondary | ICD-10-CM

## 2012-12-01 DIAGNOSIS — K219 Gastro-esophageal reflux disease without esophagitis: Secondary | ICD-10-CM

## 2012-12-02 ENCOUNTER — Other Ambulatory Visit: Payer: Self-pay | Admitting: *Deleted

## 2012-12-02 MED ORDER — ZOLPIDEM TARTRATE 5 MG PO TABS: 5.0000 mg | ORAL_TABLET | Freq: Every evening | ORAL | Status: DC | PRN

## 2012-12-02 NOTE — Telephone Encounter (Signed)
Dr. Chilton Si stated that he received a call from patient last night that she couldn't sleep and it has been a lifetime problem. Dr. Chilton Si wanted me to call in Zolpidem 5mg  One tablet at bedtime as needed for rest. Called into [facility]. Patient Notified.

## 2012-12-22 ENCOUNTER — Non-Acute Institutional Stay: Payer: Medicare Other | Admitting: Internal Medicine

## 2012-12-22 ENCOUNTER — Other Ambulatory Visit: Payer: Self-pay | Admitting: Geriatric Medicine

## 2012-12-22 ENCOUNTER — Encounter: Payer: Self-pay | Admitting: Internal Medicine

## 2012-12-22 ENCOUNTER — Other Ambulatory Visit: Payer: Self-pay

## 2012-12-22 VITALS — BP 144/82 | HR 60 | Ht 66.0 in | Wt 162.0 lb

## 2012-12-22 DIAGNOSIS — G4700 Insomnia, unspecified: Secondary | ICD-10-CM

## 2012-12-22 DIAGNOSIS — F5000 Anorexia nervosa, unspecified: Secondary | ICD-10-CM

## 2012-12-22 DIAGNOSIS — I4891 Unspecified atrial fibrillation: Secondary | ICD-10-CM

## 2012-12-22 DIAGNOSIS — F0280 Dementia in other diseases classified elsewhere without behavioral disturbance: Secondary | ICD-10-CM

## 2012-12-22 DIAGNOSIS — K219 Gastro-esophageal reflux disease without esophagitis: Secondary | ICD-10-CM

## 2012-12-22 DIAGNOSIS — G309 Alzheimer's disease, unspecified: Secondary | ICD-10-CM

## 2012-12-22 MED ORDER — MIRTAZAPINE 15 MG PO TABS: ORAL_TABLET | ORAL | Status: DC

## 2012-12-22 NOTE — Patient Instructions (Signed)
We have changed some of your medications. We will let the [facility] at [facility] know so that your medication tray will be correct.

## 2012-12-22 NOTE — Progress Notes (Signed)
Subjective:     Patient ID: [name], female    DOB: 1921-09-11, 79 y.o.   MRN: [id number]  HPI Not sleeping at night. Chronic condition.  Not eating as much. Picks at food. Has never experienced this before. Denies heartburn, indigestion. Gassy. No change in stools. No constipation.  Denies headaches.  Memory is slipping. Now on memantine 28 qd and tolerating it.  Saw Dr. Jens Som Nov 2013. She has prior diagnoses of cardiomyopathy and MI. He states she had an embolus to the LAD. Recommends lifelong anticoagulation. She had been on Xarelto, but is now off of it. Dr. Jacky Kindle stopped it earlier this year due to gait instability and risk attached to that.  Current Outpatient Prescriptions on File Prior to Visit  Medication Sig Dispense Refill  . aspirin 81 MG tablet Take 81 mg by mouth daily.      . Cholecalciferol (VITAMIN D) 1000 UNITS capsule Take 1,000 Units by mouth daily.      Marland Kitchen diltiazem (CARDIZEM CD) 120 MG 24 hr capsule Take 120 mg by mouth daily.      . divalproex (DEPAKOTE) 125 MG DR tablet Take 125 mg by mouth 2 (two) times daily.      Marland Kitchen lisinopril (PRINIVIL,ZESTRIL) 10 MG tablet Take 10 mg by mouth daily.      . Melatonin 3 MG TABS Take 1 tablet (3 mg total) by mouth at bedtime.    0  . Memantine HCl ER (NAMENDA XR) 28 MG CP24 Take 1 capsule by mouth daily.      . metoprolol succinate (TOPROL XL) 100 MG 24 hr tablet Take 1 tablet (100 mg total) by mouth daily. Take with or immediately following a meal.  30 tablet  5  . Omeprazole (PRILOSEC PO) Take 1 tablet by mouth daily as needed.      . zolpidem (AMBIEN) 5 MG tablet Take 1 tablet (5 mg total) by mouth at bedtime as needed for sleep.  30 tablet  1        Review of Systems  Constitutional: Positive for appetite change. Negative for fever, fatigue and unexpected weight change.  HENT: Negative.   Eyes: Negative.   Respiratory: Negative.   Cardiovascular: Negative for chest pain, palpitations and leg swelling.      Hx NSTEMI.  Gastrointestinal:       Anorexia  Genitourinary: Negative.   Musculoskeletal: Positive for arthralgias.  Skin: Negative.   Psychiatric/Behavioral: Positive for sleep disturbance.       Objective:   Physical Exam  Constitutional: She is oriented to person, place, and time. She appears well-developed and well-nourished. No distress.  HENT:  Right Ear: External ear normal.  Left Ear: External ear normal.  Nose: Nose normal.  Mouth/Throat: Oropharynx is clear and moist.  Eyes: Conjunctivae and EOM are normal.  Neck: No JVD present. No tracheal deviation present. No thyromegaly present.  Cardiovascular: Normal rate, normal heart sounds and intact distal pulses.  Exam reveals no gallop and no friction rub.   No murmur heard. AF  Pulmonary/Chest: Effort normal and breath sounds normal. No respiratory distress. She has no wheezes. She has no rales. She exhibits no tenderness.  Abdominal: Soft. Bowel sounds are normal. She exhibits no distension and no mass. There is no tenderness.  Musculoskeletal: She exhibits no edema and no tenderness.  Lymphadenopathy:    She has no cervical adenopathy.  Neurological: She is alert and oriented to person, place, and time. No cranial nerve deficit. Coordination normal.  Skin: No rash noted. No erythema. No pallor.  Psychiatric: She has a normal mood and affect. Her behavior is normal. Thought content normal.       Assessment & Plan:  Alzheimer disease; continue memantine  Insomnia - Plan: mirtazapine (REMERON) 15 MG tablet  Atrial fibrillation: rate controlled  Gastroesophageal reflux disease: asymptomatic  Anorexia nervosa - Plan: mirtazapine (REMERON) 15 MG tablet

## 2012-12-29 ENCOUNTER — Encounter: Payer: Self-pay | Admitting: Internal Medicine

## 2013-01-12 ENCOUNTER — Encounter: Payer: Self-pay | Admitting: Internal Medicine

## 2013-01-12 ENCOUNTER — Non-Acute Institutional Stay: Payer: Medicare Other | Admitting: Internal Medicine

## 2013-01-12 VITALS — BP 112/72 | HR 88 | Ht 66.0 in | Wt 159.0 lb

## 2013-01-12 DIAGNOSIS — I4891 Unspecified atrial fibrillation: Secondary | ICD-10-CM

## 2013-01-12 DIAGNOSIS — F5000 Anorexia nervosa, unspecified: Secondary | ICD-10-CM

## 2013-01-12 DIAGNOSIS — G4700 Insomnia, unspecified: Secondary | ICD-10-CM

## 2013-01-12 DIAGNOSIS — F0280 Dementia in other diseases classified elsewhere without behavioral disturbance: Secondary | ICD-10-CM

## 2013-01-12 DIAGNOSIS — G309 Alzheimer's disease, unspecified: Secondary | ICD-10-CM

## 2013-01-12 MED ORDER — METOPROLOL SUCCINATE ER 50 MG PO TB24: 50.0000 mg | ORAL_TABLET | Freq: Every day | ORAL | Status: DC

## 2013-01-12 MED ORDER — MIRTAZAPINE 30 MG PO TABS: 30.0000 mg | ORAL_TABLET | Freq: Every day | ORAL | Status: DC

## 2013-01-12 NOTE — Progress Notes (Signed)
Subjective:    Patient ID: [name], female    DOB: 1922-01-24, 79 y.o.   MRN: [id number]  HPI Not eating or sleeping like she used to do. Nothing of food appeals to her. She thiinks her stomach is OK. She thinks the medication (metoprolol, diltiazem)  for her irregular heart gives her loose stools on some days.  Mirtazapine at 15mg  has not been particularly helpful. When she can't sleep, she gets up and watches TV until she gets bored and goes back to bed. Some daysshe takes anap in thje day. Usually has one drink of scotch each evening.  Memory impairment continues. She most likely has Alzheimer's. Loses words.  Current Outpatient Prescriptions on File Prior to Visit  Medication Sig Dispense Refill  . aspirin 81 MG tablet Take 81 mg by mouth daily.      . Cholecalciferol (VITAMIN D) 1000 UNITS capsule Take 1,000 Units by mouth daily.      Marland Kitchen diltiazem (CARDIZEM CD) 120 MG 24 hr capsule Take 120 mg by mouth daily.      Marland Kitchen lisinopril (PRINIVIL,ZESTRIL) 10 MG tablet Take 10 mg by mouth daily.      . Memantine HCl ER (NAMENDA XR) 28 MG CP24 Take 1 capsule by mouth daily.      . metoprolol succinate (TOPROL XL) 100 MG 24 hr tablet Take 1 tablet (100 mg total) by mouth daily. Take with or immediately following a meal.  30 tablet  5  . mirtazapine (REMERON) 15 MG tablet One nightly to help sleep and appetite.  30 tablet  5  . Omeprazole (PRILOSEC PO) Take 1 tablet by mouth daily as needed.       No current facility-administered medications on file prior to visit.     Review of Systems  Constitutional: Positive for appetite change. Negative for fever, fatigue and unexpected weight change.  HENT: Negative.   Eyes: Negative.   Respiratory: Negative.   Cardiovascular: Negative for chest pain, palpitations and leg swelling.       Hx NSTEMI.  Gastrointestinal:       Anorexia  Genitourinary: Negative.   Musculoskeletal: Positive for arthralgias.  Skin: Negative.    Psychiatric/Behavioral: Positive for sleep disturbance.       Objective:BP 112/72  Pulse 88  Ht 5\' 6"  (1.676 m)  Wt 159 lb (72.122 kg)  BMI 25.68 kg/m2    Physical Exam  Constitutional: She is oriented to person, place, and time. She appears well-developed and well-nourished. No distress.  HENT:  Right Ear: External ear normal.  Left Ear: External ear normal.  Nose: Nose normal.  Mouth/Throat: Oropharynx is clear and moist.  Eyes: Conjunctivae and EOM are normal.  Neck: No JVD present. No tracheal deviation present. No thyromegaly present.  Cardiovascular: Exam reveals no gallop and no friction rub.   No murmur heard. AF  Pulmonary/Chest: Effort normal and breath sounds normal. No respiratory distress. She has no wheezes. She has no rales. She exhibits no tenderness.  Abdominal: Soft. Bowel sounds are normal. She exhibits no distension and no mass. There is no tenderness.  Musculoskeletal: She exhibits no edema and no tenderness.  Lymphadenopathy:    She has no cervical adenopathy.  Neurological: She is alert and oriented to person, place, and time. No cranial nerve deficit. Coordination normal.  Memory deficit. Only scored 13/30 on MMSE and failed clock drawing on 11/06/12.  Skin: No rash noted. No erythema. No pallor.  Psychiatric: She has a normal mood and affect. Her  behavior is normal. Thought content normal.          Assessment & Plan:  Alzheimer disease; unchanged. Tolerating Namenda  Insomnia: unchanged. Will increase mirtazapine to 30 mg  Anorexia nervosa: unchanged. Increase mirtazapine to 30 mg  Atrial fibrillation: lower dose of metoprolol seems appropriate due to lower BP   - Plan: metoprolol succinate (TOPROL-XL) 50 MG 24 hr tablet. Remains on diltiazem 120 mg qd.

## 2013-02-02 NOTE — Progress Notes (Signed)
Subjective:    Patient ID: [name], female    DOB: 01/12/1922, 79 y.o.   MRN: [id number]  HPI Atrial fibrillation: Stable. Controlled rate.  Hypertension: Controlled  Gastroesophageal reflux disease: Occasional reflux problems. May contribute to nausea.  Anorexia nervosa:, Loss of appetite. No vomiting. Says that she just can't eat.  Insomnia: Chronic condition.   Current Outpatient Prescriptions on File Prior to Visit  Medication Sig Dispense Refill  . aspirin 81 MG tablet Take 81 mg by mouth daily.      . Cholecalciferol (VITAMIN D) 1000 UNITS capsule Take 1,000 Units by mouth daily.      Marland Kitchen diltiazem (CARDIZEM CD) 120 MG 24 hr capsule Take 120 mg by mouth daily.      Marland Kitchen lisinopril (PRINIVIL,ZESTRIL) 10 MG tablet Take 10 mg by mouth daily.      . Memantine HCl ER (NAMENDA XR) 28 MG CP24 Take 1 capsule by mouth daily.      . Omeprazole (PRILOSEC PO) Take 1 tablet by mouth daily as needed.       No current facility-administered medications on file prior to visit.   Active Ambulatory Problems    Diagnosis Date Noted  . Atrial fibrillation 02/18/2012  . Hypertension   . Gastroesophageal reflux disease   . Alzheimer disease   . NSTEMI (non-ST elevated myocardial infarction)   . UTI (urinary tract infection) 11/10/2012  . Insomnia 11/16/2012  . Anorexia nervosa 12/22/2012   Resolved Ambulatory Problems    Diagnosis Date Noted  . Non-ST elevation myocardial infarction (NSTEMI) 02/18/2012   Past Medical History  Diagnosis Date  . Endometrial hyperplasia with atypia 1995  . Personal history of colonic polyps 2000  . Hyperlipidemia   . Macular degeneration   . Osteoarthritis   . Osteopenia   . Wrist fracture 1999   Past Surgical History  Procedure Laterality Date  . Appendectomy  1977  . Abdominal hysterectomy  1995    TAH/BSO   Family Status  Relation Status Death Age  . Mother Deceased   . Father Deceased   . Daughter Alive   . Sister Alive   . Daughter  Alive    History   Social History  . Marital Status: Widowed    Spouse Name: N/A    Number of Children: N/A  . Years of Education: N/A   Social History Main Topics  . Smoking status: Former Smoker    Quit date: 11/28/1972  . Smokeless tobacco: Never Used  . Alcohol Use: 0.6 oz/week    1 Glasses of wine per week  . Drug Use: None  . Sexual Activity: No   Other Topics Concern  . None   Social History Narrative   Widowed, 2 grown children . Resides at [facility] [place] section since 2004. Has in home caregivers / medication management.    Former smoker, quit 1970s, drinks wine daily   Advanced Directives: Living Will      Review of Systems  Constitutional: Positive for appetite change.  HENT: Negative.   Eyes: Negative.   Respiratory: Negative.   Cardiovascular: Negative.   Gastrointestinal: Negative.   Endocrine: Negative.   Genitourinary: Negative.   Musculoskeletal: Positive for myalgias and gait problem.       Weak legs  Neurological: Negative.   Hematological: Negative.   Psychiatric/Behavioral: Positive for sleep disturbance.       Objective:BP 116/72  Pulse 74  Temp(Src) 97.4 F (36.3 C) (Oral)  Ht 5\' 6"  (1.676 m)  Wt 161 lb (73.029 kg)  BMI 26 kg/m2    Physical Exam  Constitutional: She appears well-developed. No distress.  HENT:  Head: Normocephalic and atraumatic.  Right Ear: External ear normal.  Left Ear: External ear normal.  Nose: Nose normal.  Mouth/Throat: Oropharynx is clear and moist.  Eyes: Conjunctivae and EOM are normal. Pupils are equal, round, and reactive to light.  Neck: No JVD present. No tracheal deviation present. No thyromegaly present.  Cardiovascular: Normal rate and intact distal pulses.  Exam reveals no gallop and no friction rub.   No murmur heard. AF  Pulmonary/Chest: No respiratory distress. She has no wheezes. She has no rales. She exhibits no tenderness.  Abdominal: She exhibits no distension and  no mass. There is no tenderness.  Musculoskeletal: Normal range of motion. She exhibits no edema.  Lymphadenopathy:    She has no cervical adenopathy.  Neurological: She is alert. No cranial nerve deficit. Coordination normal.  Memory deficit  Skin: No rash noted. No erythema. No pallor.  Psychiatric: She has a normal mood and affect. Her behavior is normal. Thought content normal.          Assessment & Plan:  Atrial fibrillation: Blood pressure is low and her atrial fibrillation appears to be controlled on diltiazem metoprolol.  Hypertension: Consider discontinuation of diltiazem future. Diltiazem and metoprolol were continued this visit.  Gastroesophageal reflux disease: No new orders. Continue omeprazole.  Anorexia nervosa: Treat with mirtazapine  Insomnia: Treat with mirtazapine  Alzheimer's: Patient has progressive memory loss.  Unable to find why she was on Depakote, so I discontinued this drug.

## 2013-03-09 ENCOUNTER — Non-Acute Institutional Stay: Payer: Medicare Other | Admitting: Internal Medicine

## 2013-03-09 ENCOUNTER — Encounter: Payer: Self-pay | Admitting: Internal Medicine

## 2013-03-09 VITALS — BP 140/86 | HR 80 | Ht 66.0 in | Wt 163.0 lb

## 2013-03-09 DIAGNOSIS — I10 Essential (primary) hypertension: Secondary | ICD-10-CM

## 2013-03-09 DIAGNOSIS — G4700 Insomnia, unspecified: Secondary | ICD-10-CM

## 2013-03-09 DIAGNOSIS — I4891 Unspecified atrial fibrillation: Secondary | ICD-10-CM

## 2013-03-09 DIAGNOSIS — F0280 Dementia in other diseases classified elsewhere without behavioral disturbance: Secondary | ICD-10-CM

## 2013-03-09 DIAGNOSIS — F5000 Anorexia nervosa, unspecified: Secondary | ICD-10-CM

## 2013-03-09 NOTE — Patient Instructions (Signed)
Continue current medications. 

## 2013-03-09 NOTE — Progress Notes (Signed)
Subjective:    Patient ID: [name], female    DOB: 1922/03/12, 79 y.o.   MRN: [id number]  HPI Last seen 01/12/13.reduced metoprolol and increased mirtazapine.  Hypertension: stable  Atrial fibrillation: ratecontrolled  Alzheimer disease: unchanged  Insomnia: she says this is no better. "A life long problem". Denies snoring. Gets about 5 hours sleep per night. Sometimes naps during the day.  Anorexia nervosa; no appetite, but has gained 4 pounds since her last visit.    Current Outpatient Prescriptions on File Prior to Visit  Medication Sig Dispense Refill  . aspirin 81 MG tablet Take 81 mg by mouth daily.      . Cholecalciferol (VITAMIN D) 1000 UNITS capsule Take 1,000 Units by mouth daily.      Marland Kitchen diltiazem (CARDIZEM CD) 120 MG 24 hr capsule Take 120 mg by mouth daily.      Marland Kitchen lisinopril (PRINIVIL,ZESTRIL) 10 MG tablet Take 10 mg by mouth daily.      . Memantine HCl ER (NAMENDA XR) 28 MG CP24 Take 1 capsule by mouth daily.      . metoprolol succinate (TOPROL-XL) 50 MG 24 hr tablet Take 1 tablet (50 mg total) by mouth daily. Take with or immediately following a meal.  90 tablet  3  . mirtazapine (REMERON) 30 MG tablet Take 1 tablet (30 mg total) by mouth at bedtime. to help rest and to stimulate appetite.  90 tablet  3  . Omeprazole (PRILOSEC PO) Take 1 tablet by mouth daily as needed.       No current facility-administered medications on file prior to visit.    Review of Systems  Constitutional: Positive for appetite change. Negative for fever, fatigue and unexpected weight change.  HENT: Negative.   Eyes: Negative.   Respiratory: Negative.   Cardiovascular: Negative for chest pain, palpitations and leg swelling.       Hx NSTEMI.  Gastrointestinal:       Anorexia  Genitourinary: Negative.   Musculoskeletal: Positive for arthralgias.  Skin: Negative.   Psychiatric/Behavioral: Positive for sleep disturbance.       Objective:BP 140/86  Pulse 80  Ht 5\' 6"  (1.676 m)   Wt 163 lb (73.936 kg)  BMI 26.32 kg/m2    Physical Exam  Constitutional: She is oriented to person, place, and time. She appears well-developed and well-nourished. No distress.  HENT:  Right Ear: External ear normal.  Left Ear: External ear normal.  Nose: Nose normal.  Mouth/Throat: Oropharynx is clear and moist.  Eyes: Conjunctivae and EOM are normal.  Neck: No JVD present. No tracheal deviation present. No thyromegaly present.  Cardiovascular: Exam reveals no gallop and no friction rub.   No murmur heard. AF  Pulmonary/Chest: Effort normal and breath sounds normal. No respiratory distress. She has no wheezes. She has no rales. She exhibits no tenderness.  Abdominal: Soft. Bowel sounds are normal. She exhibits no distension and no mass. There is no tenderness.  Musculoskeletal: She exhibits no edema and no tenderness.  Lymphadenopathy:    She has no cervical adenopathy.  Neurological: She is alert and oriented to person, place, and time. No cranial nerve deficit. Coordination normal.  Memory deficit. Only scored 13/30 on MMSE and failed clock drawing on 11/06/12.  Skin: No rash noted. No erythema. No pallor.  Psychiatric: She has a normal mood and affect. Her behavior is normal. Thought content normal.          Assessment & Plan:  Hypertension: controlled  Atrial fibrillation: rate  controlled  Alzheimer disease: unchanged  Insomnia: unimproved per patient. I mentioned a sleep study, but she is not interested in this at this time.  Anorexia nervosa: weight is improved.

## 2013-03-31 ENCOUNTER — Other Ambulatory Visit: Payer: Self-pay | Admitting: *Deleted

## 2013-03-31 MED ORDER — MEMANTINE HCL ER 28 MG PO CP24: 1.0000 | ORAL_CAPSULE | Freq: Every day | ORAL | Status: DC

## 2013-03-31 MED ORDER — OMEPRAZOLE 40 MG PO CPDR: DELAYED_RELEASE_CAPSULE | ORAL | Status: DC

## 2013-04-02 ENCOUNTER — Other Ambulatory Visit: Payer: Self-pay | Admitting: Geriatric Medicine

## 2013-04-02 DIAGNOSIS — N390 Urinary tract infection, site not specified: Secondary | ICD-10-CM

## 2013-04-02 MED ORDER — SULFAMETHOXAZOLE-TMP DS 800-160 MG PO TABS: 1.0000 | ORAL_TABLET | Freq: Two times a day (BID) | ORAL | Status: AC

## 2013-04-02 NOTE — Progress Notes (Signed)
Message from Clinic nurse at [facility] yesterday: pt c/o dysuria and frequency. Sample sent for U.A C&S, returned today with small blood, large leukocyte esterase, >50 WBC, few bacteria. Pt w/ h/o UTI. Sent Rx for Bactrim to [facility].

## 2013-04-23 ENCOUNTER — Encounter: Payer: Self-pay | Admitting: Internal Medicine

## 2013-05-18 ENCOUNTER — Non-Acute Institutional Stay: Payer: Medicare Other | Admitting: Internal Medicine

## 2013-05-18 ENCOUNTER — Encounter: Payer: Self-pay | Admitting: Internal Medicine

## 2013-05-18 VITALS — BP 132/84 | HR 84 | Wt 163.0 lb

## 2013-05-18 DIAGNOSIS — F0280 Dementia in other diseases classified elsewhere without behavioral disturbance: Secondary | ICD-10-CM

## 2013-05-18 DIAGNOSIS — I10 Essential (primary) hypertension: Secondary | ICD-10-CM

## 2013-05-18 DIAGNOSIS — G4700 Insomnia, unspecified: Secondary | ICD-10-CM

## 2013-05-18 NOTE — Progress Notes (Signed)
Subjective:    Patient ID: [name], female    DOB: Oct 17, 1921, 79 y.o.   MRN: [id number]   Chief Complaint  Patient presents with  . Sleeping Problem    needs something for sleep, OTC not helping    HPI Insomnia. Sometimes she can fall asleep, but other nights, she has difficulty. Frequently lays awake. Denies palpitations. Occasional nocturia, but not every night. No pain. No dyspnea. Has been tried on multiple medications in the past. This is a lifelong problem.  Hypertension: controlled  Alzheimer disease; she does not recognize the extent of her memory deficit. She does not seem to recall why Personal Services is handling her medication.     Current Outpatient Prescriptions on File Prior to Visit  Medication Sig Dispense Refill  . aspirin 81 MG tablet Take 81 mg by mouth daily.      . Cholecalciferol (VITAMIN D) 1000 UNITS capsule Take 1,000 Units by mouth daily.      Marland Kitchen diltiazem (CARDIZEM CD) 120 MG 24 hr capsule Take 120 mg by mouth daily.      Marland Kitchen lisinopril (PRINIVIL,ZESTRIL) 10 MG tablet Take 10 mg by mouth daily.      . Memantine HCl ER (NAMENDA XR) 28 MG CP24 Take 28 mg by mouth daily.  30 capsule  5  . metoprolol succinate (TOPROL-XL) 50 MG 24 hr tablet Take 1 tablet (50 mg total) by mouth daily. Take with or immediately following a meal.  90 tablet  3  . mirtazapine (REMERON) 30 MG tablet Take 1 tablet (30 mg total) by mouth at bedtime. to help rest and to stimulate appetite.  90 tablet  3  . omeprazole (PRILOSEC) 40 MG capsule Take one capsule once daily for reflux  30 capsule  5  . [DISCONTINUED] Omeprazole (PRILOSEC PO) Take 1 tablet by mouth daily as needed.       No current facility-administered medications on file prior to visit.    Review of Systems  Constitutional: Positive for appetite change. Negative for fever, fatigue and unexpected weight change.  HENT: Negative.   Eyes: Negative.   Respiratory: Negative.   Cardiovascular: Negative for chest  pain, palpitations and leg swelling.       Hx NSTEMI.  Gastrointestinal:       Anorexia  Genitourinary: Negative.   Musculoskeletal: Positive for arthralgias.  Skin: Negative.   Psychiatric/Behavioral: Positive for sleep disturbance.       Objective:BP 132/84  Pulse 84  Wt 163 lb (73.936 kg)    Physical Exam  Constitutional: She is oriented to person, place, and time. She appears well-developed and well-nourished. No distress.  HENT:  Right Ear: External ear normal.  Left Ear: External ear normal.  Nose: Nose normal.  Mouth/Throat: Oropharynx is clear and moist.  Eyes: Conjunctivae and EOM are normal.  Neck: No JVD present. No tracheal deviation present. No thyromegaly present.  Cardiovascular: Exam reveals no gallop and no friction rub.   No murmur heard. AF  Pulmonary/Chest: Effort normal and breath sounds normal. No respiratory distress. She has no wheezes. She has no rales. She exhibits no tenderness.  Abdominal: Soft. Bowel sounds are normal. She exhibits no distension and no mass. There is no tenderness.  Musculoskeletal: She exhibits no edema and no tenderness.  Lymphadenopathy:    She has no cervical adenopathy.  Neurological: She is alert and oriented to person, place, and time. No cranial nerve deficit. Coordination normal.  Memory deficit. Only scored 13/30 on MMSE and  failed clock drawing on 11/06/12.  Skin: No rash noted. No erythema. No pallor.  Psychiatric: She has a normal mood and affect. Her behavior is normal. Thought content normal.  Fidgety. Agitated about her problem with insomnia.          Assessment & Plan:  1. Hypertension controlled  2. Insomnia Try zolpidem again  3. Alzheimer disease Slowly progressing

## 2013-05-18 NOTE — Patient Instructions (Signed)
Use medications as directed

## 2013-05-19 ENCOUNTER — Other Ambulatory Visit: Payer: Self-pay | Admitting: Geriatric Medicine

## 2013-05-19 MED ORDER — ZOLPIDEM TARTRATE 5 MG PO TABS: 5.0000 mg | ORAL_TABLET | Freq: Every evening | ORAL | Status: DC | PRN

## 2013-05-19 NOTE — Progress Notes (Unsigned)
Phone call from patient today re: "sleep medicine". She does not recall the process Dr.Green explained yesterday. This patient';s medication is managed by [facility]. A new Rx for Ambien was called to her pharmacy yesterday, it needs to be picked up and added to her medication box. I have called Darcel Bayley, RN and left message to call back.

## 2013-05-26 ENCOUNTER — Other Ambulatory Visit: Payer: Self-pay | Admitting: *Deleted

## 2013-05-26 MED ORDER — DILTIAZEM HCL ER COATED BEADS 120 MG PO CP24: ORAL_CAPSULE | ORAL | Status: DC

## 2013-06-10 ENCOUNTER — Non-Acute Institutional Stay: Payer: Medicare Other | Admitting: Geriatric Medicine

## 2013-06-10 ENCOUNTER — Encounter: Payer: Self-pay | Admitting: Geriatric Medicine

## 2013-06-10 VITALS — BP 132/78 | HR 96 | Temp 98.1°F | Wt 159.0 lb

## 2013-06-10 DIAGNOSIS — F0280 Dementia in other diseases classified elsewhere without behavioral disturbance: Secondary | ICD-10-CM

## 2013-06-10 NOTE — Progress Notes (Signed)
Patient ID: [name], female   DOB: [birth year], 79 y.o.   MRN: [id number]  [facility] [phone])  Code Status: Living Will Contact Information   Name Relation Home Work Mobile   Friendship Daughter [phone]  [phone]   [name] Daughter [phone]  [phone]      Chief Complaint  Patient presents with  . Weakness    no appetite, not sleeping, chest congestion, nose stopped up, no body aches    HPI: This is a 79 y.o. female resident of [facility],  Independent Living  section.  Evaluation is requested today due to weakness, confusion, mild upper respiratory symptoms.  This patient has significant short-term memory loss, has difficulty managing medications, but has recently declined appropriate assistance by personal care services. Pillboxes that should have been filled through Thursday, January 1 were empty as of Monday, December 29. Patient is not aware that the boxes are empty, cannot report where the medicines are.  Daughter Larita Fife is present at visit today, reports that patient was with her with her in [place] last week for the Christmas holiday. Reports she was taking her medicines appropriately during that week.   Allergies  Allergen Reactions  . Codeine   . Zolpidem Other (See Comments)    Becomes agitated and hallicuinates      Medication List       This list is accurate as of: 06/10/13 11:59 PM.  Always use your most recent med list.               aspirin 81 MG tablet  Take 81 mg by mouth daily.     diltiazem 120 MG 24 hr capsule  Commonly known as:  CARDIZEM CD  Take one tablet by mouth once daily     lisinopril 10 MG tablet  Commonly known as:  PRINIVIL,ZESTRIL  Take 10 mg by mouth daily.     Memantine HCl ER 28 MG Cp24  Commonly known as:  NAMENDA XR  Take 28 mg by mouth daily.     metoprolol succinate 50 MG 24 hr tablet  Commonly known as:  TOPROL-XL  Take 1 tablet (50 mg total) by  mouth daily. Take with or immediately following a meal.     omeprazole 40 MG capsule  Commonly known as:  PRILOSEC  Take one capsule once daily for reflux     Vitamin D 1000 UNITS capsule  Take 1,000 Units by mouth daily.     zolpidem 5 MG tablet  Commonly known as:  AMBIEN  Take 5 mg by mouth at bedtime as needed for sleep.        DATA REVIEWED  Radiologic Exams:   Cardiovascular Exams:   Laboratory Studies Lab Results- [facility] 11/09/2012  Component Value   WBC 8.9   HGB 13.0   HCT 39   PLT 227       GLUCOSE 95   ALT 18   AST 21   NA 144   K 4.0   CREATININE 0.7   BUN 19       Review of Systems  DATA OBTAINED: from patient, family member GENERAL: Feels tired.   No fevers, fatigue, change in appetite or weight SKIN: No itch, rash or open wounds EYES: No eye pain, dryness or itching  No change in vision EARS: No earache, tinnitus, change in hearing NOSE: Mild nasal congestion, drainage or bleeding MOUTH/THROAT: No mouth or tooth pain  No sore throat   No difficulty chewing or  swallowing RESPIRATORY: No cough, wheezing, SOB CARDIAC: No chest pain,    No edema. GI: No abdominal pain  No N/V/D or constipation  No heartburn or reflux  GU: No dysuria, frequency or urgency  No change in urine volume or character  MUSCULOSKELETAL: No joint pain, swelling or stiffness  No back pain  No muscle ache, pain, weakness  Gait is steady  No recent falls.  NEUROLOGIC: No dizziness, fainting, headache. Recent increase in confusion PSYCHIATRIC: No feelings of anxiety, depression Does not sleeps well.   Declines assistance with medication management, other ADLs.     Physical Exam Filed Vitals:   06/10/13 0838  BP: 132/78  Pulse: 96  Temp: 98.1 F (36.7 C)  TempSrc: Oral  Weight: 159 lb (72.122 kg)   Body mass index is 25.68 kg/(m^2).  GENERAL APPEARANCE: No acute distress, appropriately groomed, normal body habitus. Alert, pleasant, conversant. SKIN: No diaphoresis,  rash,  HEAD: Normocephalic, atraumatic EYES: Conjunctiva/lids clear. Pupils round, reactive.  EARS: External exam WNL. Hearing grossly normal. NOSE: No deformity or discharge. MOUTH/THROAT: Lips w/o lesions. Oral mucosa, tongue moist, w/o lesion. Oropharynx w/o redness or lesions.  NECK: Supple, full ROM. No thyroid tenderness, enlargement or nodule LYMPHATICS: No head, neck or supraclavicular adenopathy RESPIRATORY: Breathing is even, unlabored. Lung sounds are clear and full.  CARDIOVASCULAR: Heart RRR. No murmur or extra heart sounds  ARTERIAL: No carotid bruit.   EDEMA: No peripheral  edema. GASTROINTESTINAL: Abdomen is soft, non-tender, not distended w/ normal bowel sounds.   MUSCULOSKELETAL: Moves all extremities with full ROM, strength and tone. Back is without kyphosis, scoliosis or spinal process tenderness. Gait is steady NEUROLOGIC: Not Oriented to time, Oriented place, person. Speech clear, no tremor.   PSYCHIATRIC: Mildly suspicious, denies any difficulty with memory.  ASSESSMENT/PLAN  Alzheimer disease Patient with memory/cognitive deficits, probable Alzheimer's type dementia, moderate stage (11/2012 MMSE 13/ 30, failed clock test).  Independent with basic ADLS, requires assistance with higher functioning activities including medication management.  Patient has no insight into her memory/cognitive problems, is easily agitated.  Four days of medication are unaccounted for; could be cause of increased confusion. Strongly recommend daily management/ monitoring of medication use. D/C Ambien as this medication likely to cause adverse effects. Patient/ daughter are in agreement with this plan today   Time: , >50% spent counseling/or care coordination  Follow up: As scheduled 06/15/2013  Mayvis Agudelo T.Carmine Carrozza, NP-C 06/10/2013

## 2013-06-14 NOTE — Assessment & Plan Note (Signed)
Patient with memory/cognitive deficits, probable Alzheimer's type dementia, moderate stage (11/2012 MMSE 13/ 30, failed clock test).  Independent with basic ADLS, requires assistance with higher functioning activities including medication management.  Patient has no insight into her memory/cognitive problems, is easily agitated.  Four days of medication are unaccounted for; could be cause of increased confusion. Strongly recommend daily management/ monitoring of medication use. D/C Ambien as this medication likely to cause adverse effects. Patient/ daughter are in agreement with this plan today

## 2013-06-15 ENCOUNTER — Non-Acute Institutional Stay: Payer: Medicare Other | Admitting: Internal Medicine

## 2013-06-15 ENCOUNTER — Encounter: Payer: Self-pay | Admitting: Internal Medicine

## 2013-06-15 VITALS — BP 118/64 | HR 72 | Wt 158.0 lb

## 2013-06-15 DIAGNOSIS — F0280 Dementia in other diseases classified elsewhere without behavioral disturbance: Secondary | ICD-10-CM

## 2013-06-15 DIAGNOSIS — G4700 Insomnia, unspecified: Secondary | ICD-10-CM

## 2013-06-15 DIAGNOSIS — I4891 Unspecified atrial fibrillation: Secondary | ICD-10-CM

## 2013-06-15 DIAGNOSIS — I10 Essential (primary) hypertension: Secondary | ICD-10-CM

## 2013-06-15 DIAGNOSIS — G309 Alzheimer's disease, unspecified: Principal | ICD-10-CM

## 2013-06-15 NOTE — Progress Notes (Signed)
Patient ID: [name], female   DOB: 1922/02/10, 80 y.o.   MRN: [id number]    Location:  [facility] [employer] [facility] Clinic (12)    Allergies  Allergen Reactions  . Codeine   . Zolpidem Other (See Comments)    Becomes agitated and hallicuinates    Chief Complaint  Patient presents with  . Medical Managment of Chronic Issues    Alzheimers,  feels better since here last week, still has poor appetite, and trouble sleeping.    HPI:   Alzheimer disease:continues with slow decline in memory  Insomnia: unchanged. She is frustrated about inability to get a satisfactory night's sleep  Hypertension: controlled  Atrial fibrillation: rate controlled    Medications: Patient's Medications  New Prescriptions   No medications on file  Previous Medications   ASPIRIN 81 MG TABLET    Take 81 mg by mouth daily.   CHOLECALCIFEROL (VITAMIN D) 1000 UNITS CAPSULE    Take 1,000 Units by mouth daily.   DILTIAZEM (CARDIZEM CD) 120 MG 24 HR CAPSULE    Take one tablet by mouth once daily   LISINOPRIL (PRINIVIL,ZESTRIL) 10 MG TABLET    Take 10 mg by mouth daily.   MEMANTINE HCL ER (NAMENDA XR) 28 MG CP24    Take 28 mg by mouth daily.   METOPROLOL SUCCINATE (TOPROL-XL) 50 MG 24 HR TABLET    Take 1 tablet (50 mg total) by mouth daily. Take with or immediately following a meal.   OMEPRAZOLE (PRILOSEC) 40 MG CAPSULE    Take one capsule once daily for reflux  Modified Medications   No medications on file  Discontinued Medications   No medications on file     Review of Systems  Constitutional: Positive for appetite change. Negative for fever, fatigue and unexpected weight change.  HENT: Negative.   Eyes: Negative.   Respiratory: Negative.   Cardiovascular: Negative for chest pain, palpitations and leg swelling.       Hx NSTEMI.  Gastrointestinal:       Anorexia  Genitourinary: Negative.   Musculoskeletal: Positive for arthralgias.  Skin: Negative.     Psychiatric/Behavioral: Positive for sleep disturbance.    Filed Vitals:   06/15/13 1539  BP: 118/64  Pulse: 72  Weight: 158 lb (71.668 kg)   Physical Exam  Constitutional: She is oriented to person, place, and time. She appears well-developed and well-nourished. No distress.  HENT:  Right Ear: External ear normal.  Left Ear: External ear normal.  Nose: Nose normal.  Mouth/Throat: Oropharynx is clear and moist.  Eyes: Conjunctivae and EOM are normal.  Neck: No JVD present. No tracheal deviation present. No thyromegaly present.  Cardiovascular: Exam reveals no gallop and no friction rub.   No murmur heard. AF  Pulmonary/Chest: Effort normal and breath sounds normal. No respiratory distress. She has no wheezes. She has no rales. She exhibits no tenderness.  Abdominal: Soft. Bowel sounds are normal. She exhibits no distension and no mass. There is no tenderness.  Musculoskeletal: She exhibits no edema and no tenderness.  Lymphadenopathy:    She has no cervical adenopathy.  Neurological: She is alert and oriented to person, place, and time. No cranial nerve deficit. Coordination normal.  Memory deficit. Only scored 13/30 on MMSE and failed clock drawing on 11/06/12.  Skin: No rash noted. No erythema. No pallor.  Psychiatric:  Fidgety. Agitated about her problem with insomnia.     Labs reviewed: Nursing Home on 06/10/2013  Component Date  Value Range Status  . Hemoglobin 11/09/2012 13.0  12.0 - 16.0 g/dL Final  . HCT 40/98/119106/06/2012 39  36 - 46 % Final  . Platelets 11/09/2012 227  150 - 399 K/L Final  . Glucose 11/09/2012 95   Final  . Creatinine 11/09/2012 0.7  0.5 - 1.1 mg/dL Final  . Potassium 47/82/956206/06/2012 4.0  3.4 - 5.3 mmol/L Final  . Sodium 11/09/2012 144  137 - 147 mmol/L Final  . Alkaline Phosphatase 11/09/2012 61  25 - 125 U/L Final  . ALT 11/09/2012 18  7 - 35 U/L Final  . AST 11/09/2012 21  13 - 35 U/L Final      Assessment/Plan  1. Insomnia Continue  zolpidem  2. Alzheimer disease Continue to monitor  3. Hypertension controlled  4. Atrial fibrillation Rate controlled

## 2013-06-16 ENCOUNTER — Other Ambulatory Visit: Payer: Self-pay | Admitting: Geriatric Medicine

## 2013-06-16 MED ORDER — ASPIRIN 81 MG PO TABS: 81.0000 mg | ORAL_TABLET | Freq: Every day | ORAL | Status: DC

## 2013-06-16 MED ORDER — ZOLPIDEM TARTRATE 5 MG PO TABS: 5.0000 mg | ORAL_TABLET | Freq: Every day | ORAL | Status: DC

## 2013-07-15 ENCOUNTER — Non-Acute Institutional Stay: Payer: Medicare Other | Admitting: Geriatric Medicine

## 2013-07-15 ENCOUNTER — Encounter: Payer: Self-pay | Admitting: Geriatric Medicine

## 2013-07-15 VITALS — BP 110/72 | HR 96 | Ht 66.0 in | Wt 157.0 lb

## 2013-07-15 DIAGNOSIS — F411 Generalized anxiety disorder: Secondary | ICD-10-CM | POA: Insufficient documentation

## 2013-07-15 DIAGNOSIS — I4891 Unspecified atrial fibrillation: Secondary | ICD-10-CM

## 2013-07-15 MED ORDER — DILTIAZEM HCL ER COATED BEADS 120 MG PO CP24: ORAL_CAPSULE | ORAL | Status: DC

## 2013-07-15 NOTE — Assessment & Plan Note (Signed)
Increased anxiety/ agitation re: memory issues and plan for move to [place]. Pt. reports she is not moving and is feeling better. Family/[facility] plan [facility] for [place] in the near future ([place]). Will consider adding Cymbalta next week if AF is better controlled.

## 2013-07-15 NOTE — Progress Notes (Signed)
Patient ID: [name], female   DOB: Nov 01, 1921, 80 y.o.   MRN: [id number]  [facility] [phone])  Code Status: Living Will     Contact Information   Name Relation Home Work Mobile   [name] [name] [phone]  [phone]   [name] [name] [phone]  [phone]      Chief Complaint  Patient presents with  . Palpitations    daily not worse, off and on for years, just wants to know if this is normal, no SOB. Hasn't seen [name] in years  . Anorexia    no appetitie has to make herself  to eat 3 meals a day, no nausea. wt 05/18/13 163lbs, 06/10/13 159lbs, 06/15/13 158 lbs.  . Insomnia    take the Ambien 5mg , doesn't last long    HPI: This is a 80 y.o. female resident of [facility],  Independent Living  section.  Evaluation is requested today due to palpitations. Patient reports she is having palpitations daily. He is a bit inconsistent in the history in that reports this has been going on for years, but reports its worse lately. Denies shortness of breath chest pain dizziness. Continues to complain of no appetite, has to make herself eat 3 meals a day. Denies any GI symptoms. Also reports she can't sleep. Ambien was restarted at her last visit with [name], this helps her get to sleep but doesn't last all night.  Does experience palpitations during the night, she describes this as her heart pounding. Medications are managed by [facility]  Recent visit:  06/10/2014 Alzheimer disease Patient with memory/cognitive deficits, probable Alzheimer's type dementia, moderate stage (11/2012 MMSE 13/ 30, failed clock test).  Independent with basic ADLS, requires assistance with higher functioning activities including medication management.  Patient has no insight into her memory/cognitive problems, is easily agitated.  Four days of medication are unaccounted for; could be cause of increased confusion. Strongly recommend  daily management/ monitoring of medication use. D/C Ambien as this medication likely to cause adverse effects. Patient/ [name] are in agreement with this plan today    Allergies  Allergen Reactions  . Codeine   . Zolpidem Other (See Comments)    Becomes agitated and hallicuinates      Medication List       This list is accurate as of: 07/15/13 10:16 AM.  Always use your most recent med list.               aspirin 81 MG tablet  Take 1 tablet (81 mg total) by mouth daily.     diltiazem 120 MG 24 hr capsule  Commonly known as:  CARDIZEM CD  Take one tablet by mouth once daily     lisinopril 10 MG tablet  Commonly known as:  PRINIVIL,ZESTRIL  Take 10 mg by mouth daily.     Memantine HCl ER 28 MG Cp24  Commonly known as:  NAMENDA XR  Take 28 mg by mouth daily.     metoprolol succinate 50 MG 24 hr tablet  Commonly known as:  TOPROL-XL  Take 1 tablet (50 mg total) by mouth daily. Take with or immediately following a meal.     omeprazole 40 MG capsule  Commonly known as:  PRILOSEC  Take one capsule once daily for reflux     Vitamin D 1000 UNITS capsule  Take 1,000 Units by mouth daily.     zolpidem 5 MG tablet  Commonly known as:  AMBIEN  Take  1 tablet (5 mg total) by mouth at bedtime.        DATA REVIEWED  Radiologic Exams:   Cardiovascular Exams:   Laboratory Studies Lab Results- [facility] 11/09/2012  Component Value   WBC 8.9   HGB 13.0   HCT 39   PLT 227       GLUCOSE 95   ALT 18   AST 21   NA 144   K 4.0   CREATININE 0.7   BUN 19       REVIEW OF SYSTEMS DATA OBTAINED: from patient,   GENERAL: Feels tired.   No fevers, fatigue, "no appetite, I have to make myself eat 3 meals a day "   new) SKIN: No itch, rash or open wounds MOUTH/THROAT: No mouth or tooth pain  No sore throat   No difficulty chewing or swallowing RESPIRATORY: No cough, wheezing, SOB CARDIAC: No chest pain,    No edema. GI: No abdominal pain  No N/V/D or constipation  No  heartburn or reflux  GU: No dysuria, frequency or urgency  No change in urine volume or character  MUSCULOSKELETAL: No joint pain, swelling or stiffness  No back pain  No muscle ache, pain, weakness  Gait is unsteady "I have to be very careful..."  No recent falls.  NEUROLOGIC: No dizziness, fainting, headache. Denies memory problems PSYCHIATRIC: Reports recent anxiety due to planned move [place]. Does not sleeps well (chronic).        Physical Exam Filed Vitals:   07/15/13 0928  BP: 110/72  Pulse: 96  Height: 5\' 6"  (1.676 m)  Weight: 157 lb (71.215 kg)  SpO2: 98%   Body mass index is 25.35 kg/(m^2).  GENERAL APPEARANCE: No acute distress, appropriately groomed, normal body habitus. Alert,  Conversant, defensive SKIN: No diaphoresis, rash,  HEAD: Normocephalic, atraumatic EYES: Conjunctiva/lids clear. Pupils round, reactive.  EARS:  Hearing deficit evident. NOSE: No deformity or discharge. RESPIRATORY: Breathing is even, unlabored. Lung sounds are clear and full.  CARDIOVASCULAR: Heart IRRR,  EKG: irregular rhythm, no p wave, HR 107BPM. No murmur or extra heart sounds  EDEMA: No peripheral  edema. MUSCULOSKELETAL: Moves all extremities with full ROM, strength and tone. Back is without kyphosis, scoliosis or spinal process tenderness. Gait is unsteady NEUROLOGIC: Not Oriented to time, Oriented place, person. Speech clear, no tremor.   PSYCHIATRIC: Suspicious, defensive, denies any difficulty with memory.  ASSESSMENT/PLAN  Atrial fibrillation Heart in irregular rhythm, rate a bit elevated. Increase diltiazem, re-evalauate next week  Generalized anxiety disorder Increased anxiety/ agitation re: memory issues and plan for move [place]. Pt. reports she is not moving and is feeling better. Family/[facility] plan [facility] for [place] in the near future ([place]). Will consider adding [name] next week if AF is better controlled.   Follow up: 1 week  [name] Kathryn Tigue,  NP-C 07/15/2013

## 2013-07-15 NOTE — Assessment & Plan Note (Signed)
Heart in irregular rhythm, rate a bit elevated. Increase diltiazem, re-evalauate next week

## 2013-07-22 ENCOUNTER — Encounter: Payer: Self-pay | Admitting: Geriatric Medicine

## 2013-07-22 ENCOUNTER — Non-Acute Institutional Stay: Payer: Medicare Other | Admitting: Geriatric Medicine

## 2013-07-22 VITALS — BP 146/84 | HR 84 | Ht 66.0 in | Wt 158.0 lb

## 2013-07-22 DIAGNOSIS — I4891 Unspecified atrial fibrillation: Secondary | ICD-10-CM

## 2013-07-22 NOTE — Assessment & Plan Note (Signed)
Heart remains irregular, rate is variable range from 84-103 during visit today. Patient is taking both calcium channel blocker and beta blocker in the morning, will separate these medications for better overall effect.

## 2013-07-22 NOTE — Progress Notes (Signed)
Patient ID: [name], female   DOB: 08/17/1921, 80 y.o.   MRN: [id number]  [facility] [phone])  Code Status: Living Will     Contact Information   Name Relation Home Work Mobile   VanderManess,[name] [phone]  [phone]   [name],[name] [phone]  [phone]      Chief Complaint  Patient presents with  . Medical Managment of Chronic Issues    A-Fib, anxiety. c/o on not able to sleep and no appetitie  . Medication Problem    patient took Tues & Wed evening pill on Tues evening per Cherly HensenBernadette, [facility]. She went to patients apartment after she called saying she was too weak to come into her morning appointment. PSC says she some times takes 2 days worth of pills in one day.     HPI: This is a 80 y.o. female resident of [facility]  [facility].  She returns to clinic this afternoon for evaluation of atrial fibrillation Last visit:  Atrial fibrillation Heart in irregular rhythm, rate a bit elevated. Increase diltiazem, re-evalauate next week  Generalized anxiety disorder Increased anxiety/ agitation re: memory issues and plan for [place]. Pt. reports she is not moving and is feeling better. Family/[facility] plan remians in place for [place] in the near future (March). Will consider adding Cymbalta next week if AF is better controlled.   Since last visit patient reports she's taking medications as prescribed though [facility] is patient sometimes takes 2 days worth of pills in one day. Patient of course denies this. She does admit to taking 2 Ambien last night because she couldn't sleep. "Nothing helps me sleep".  Patient does report that she is tired of this heart racing she feels in her chest, feels it at night and also in the morning.   Allergies  Allergen Reactions  . Codeine   . Zolpidem Other (See Comments)    Becomes agitated and hallicuinates       Medication List       This list is accurate as of: 07/22/13  2:16 PM.  Always use your most recent med list.               aspirin 81 MG tablet  Take 1 tablet (81 mg total) by mouth daily.     diltiazem 120 MG 24 hr capsule  Commonly known as:  CARDIZEM CD  Take two tablets by mouth once daily to regulate heart rate and rhythm     lisinopril 10 MG tablet  Commonly known as:  PRINIVIL,ZESTRIL  Take 10 mg by mouth daily.     Memantine HCl ER 28 MG Cp24  Commonly known as:  NAMENDA XR  Take 28 mg by mouth daily.     metoprolol succinate 50 MG 24 hr tablet  Commonly known as:  TOPROL-XL  Take 50 mg by mouth at bedtime.     omeprazole 40 MG capsule  Commonly known as:  PRILOSEC  Take one capsule once daily for reflux     Vitamin D 1000 UNITS capsule  Take 1,000 Units by mouth daily.     zolpidem 5 MG tablet  Commonly known as:  AMBIEN  Take 1 tablet (5 mg total) by mouth at bedtime.        DATA REVIEWED  Radiologic Exams:   Cardiovascular Exams:   Laboratory Studies Lab Results- [facility] 11/09/2012  Component Value   WBC 8.9  HGB 13.0   HCT 39   PLT 227       GLUCOSE 95   ALT 18   AST 21   NA 144   K 4.0   CREATININE 0.7   BUN 19       REVIEW OF SYSTEMS DATA OBTAINED: from patient,   GENERAL: Feels tired.   No fevers, fatigue, "no appetite, I have to make myself eat 3 meals a day "   new) SKIN: No itch, rash or open wounds MOUTH/THROAT: No mouth or tooth pain  No sore throat   No difficulty chewing or swallowing RESPIRATORY: No cough, wheezing, SOB CARDIAC: No chest pain,    No edema. GI: No abdominal pain  No N/V/D or constipation  No heartburn or reflux  GU: No dysuria, frequency or urgency  No change in urine volume or character  MUSCULOSKELETAL: No joint pain, swelling or stiffness  No back pain  No muscle ache, pain, weakness  Gait is unsteady "I have to be very careful..."  No recent falls.  NEUROLOGIC: No dizziness, fainting,  headache. Denies memory problems PSYCHIATRIC: Reports recent anxiety due to planned [place]. Does not sleeps well (chronic).        PHYSICAL EXAM  Filed Vitals:   07/22/13 1351  BP: 146/84  Pulse: 84  Height: 5\' 6"  (1.676 m)  Weight: 158 lb (71.668 kg)   Body mass index is 25.51 kg/(m^2).  GENERAL APPEARANCE: No acute distress, appropriately groomed, normal body habitus. Alert,  Conversant, defensive SKIN: No diaphoresis, rash,  HEAD: Normocephalic, atraumatic EYES: Conjunctiva/lids clear. Pupils round, reactive.  EARS:  Hearing deficit evident. NOSE: No deformity or discharge. RESPIRATORY: Breathing is even, unlabored. Lung sounds are clear and full.  CARDIOVASCULAR: Heart IRRR,  Rate 84-103. No murmur or extra heart sounds  EDEMA: No peripheral  edema. MUSCULOSKELETAL: Moves all extremities with full ROM, strength and tone. Back is without kyphosis, scoliosis or spinal process tenderness. Gait is unsteady NEUROLOGIC: Not Oriented to time, Oriented place, person. Speech clear, no tremor.   PSYCHIATRIC: Suspicious, defensive, denies any difficulty with memory.  ASSESSMENT/PLAN  Atrial fibrillation Heart remains irregular, rate is variable range from 84-103 during visit today. Patient is taking both calcium channel blocker and beta blocker in the morning, will separate these medications for better overall effect.    Follow up:  Return in about 3 weeks (around 08/10/2013) for OV w/Dr.Green.  Toribio Harbour, NP-C [facility] [phone]  07/22/2013

## 2013-08-04 ENCOUNTER — Encounter: Payer: Self-pay | Admitting: Physician Assistant

## 2013-08-04 ENCOUNTER — Ambulatory Visit (INDEPENDENT_AMBULATORY_CARE_PROVIDER_SITE_OTHER): Payer: Medicare Other | Admitting: Physician Assistant

## 2013-08-04 VITALS — BP 110/70 | HR 84 | Ht 66.0 in | Wt 155.0 lb

## 2013-08-04 DIAGNOSIS — I10 Essential (primary) hypertension: Secondary | ICD-10-CM

## 2013-08-04 DIAGNOSIS — I4891 Unspecified atrial fibrillation: Secondary | ICD-10-CM

## 2013-08-04 DIAGNOSIS — I429 Cardiomyopathy, unspecified: Secondary | ICD-10-CM

## 2013-08-04 DIAGNOSIS — I219 Acute myocardial infarction, unspecified: Secondary | ICD-10-CM

## 2013-08-04 DIAGNOSIS — I240 Acute coronary thrombosis not resulting in myocardial infarction: Secondary | ICD-10-CM | POA: Insufficient documentation

## 2013-08-04 DIAGNOSIS — F0280 Dementia in other diseases classified elsewhere without behavioral disturbance: Secondary | ICD-10-CM

## 2013-08-04 DIAGNOSIS — G309 Alzheimer's disease, unspecified: Secondary | ICD-10-CM

## 2013-08-04 DIAGNOSIS — I428 Other cardiomyopathies: Secondary | ICD-10-CM

## 2013-08-04 LAB — BASIC METABOLIC PANEL
BUN: 28 mg/dL — AB (ref 6–23)
CALCIUM: 9.2 mg/dL (ref 8.4–10.5)
CO2: 29 mEq/L (ref 19–32)
Chloride: 107 mEq/L (ref 96–112)
Creatinine, Ser: 0.9 mg/dL (ref 0.4–1.2)
GFR: 62.55 mL/min (ref >60.00)
Glucose, Bld: 83 mg/dL (ref 70–99)
Potassium: 3.8 mEq/L (ref 3.5–5.1)
Sodium: 143 mEq/L (ref 135–145)

## 2013-08-04 NOTE — Progress Notes (Signed)
Will review BMP when available

## 2013-08-04 NOTE — Patient Instructions (Signed)
Your physician recommends that you have lab work today: BMET   Your physician recommends that you continue on your current medications as directed. Please refer to the Current Medication list given to you today.  Your physician wants you to follow-up in: 6 months with Dr. Shelda Pal will receive a reminder letter in the mail two months in advance. If you don't receive a letter, please call our office to schedule the follow-up appointment.

## 2013-08-04 NOTE — Progress Notes (Addendum)
[address] [place], [place]  [address] Phone: [phone] Fax:  [phone]  Date:  08/04/2013   Patient ID:  Charlies ConstableCarolyn W Gemmill, DOB 02/02/1922, MRN [id number]   PCP:  Kimber RelicGREEN, ARTHUR G, MD  Cardiologist:  Jens Somrenshaw  History of Present Illness: Charlies ConstableCarolyn W Abad is a 80 y.o. female with history of HTN, anxiety, probable Alzheimer's dementia, atrial fibrillation which was diagnosed in 02/2012 at which time she sustained an NSTEMI. LHC showed large filling defect in apical LAD suggestive of embolism (RCA, LCx, LM ok). Cath complicated by severe delirium. EF was 45% by cath and 30% by echocardiogram at that time along with mid and apical myocardial hypokinesis mild MR, mod TR, PAP 48mmHg. Anticoagulation was stopped by Dr. Jacky KindleAronson (per [facility] 11/2012) due to patient's gait instability/ increased risk to fall as well as poor compliance with medications. She was seen by primary care on 07/22/13 at which time she reported sense of anxiety and bothered by palpitations. HR in the office was 84-104 and there suspicion of inconsistent med compliance ("PSC says she some times takes 2 days worth of pills in one day"). The patient denied this but there have been plans to try to get her into assisted living. She was instructed to begin taking Toprol at night.  This has helped. She comes in today feeling well, [facility]. She does occasionally feel her palpitations but is much less bothered by them. Denies CP, SOB, LEE, orthopnea, weight gain, syncope, or bleeding issues. She continues to occasionally feel "weak" with gait instability.  Recent Labs: 11/09/2012: ALT 18; Creatinine 0.7; Hemoglobin 13.0; Potassium 4.0   Wt Readings from Last 3 Encounters:  08/04/13 155 lb (70.308 kg)  07/22/13 158 lb (71.668 kg)  07/15/13 157 lb (71.215 kg)     Past Medical History  Diagnosis Date  . Gastroesophageal reflux disease   . Atrial fibrillation 2013    a. Dx 02/2012, had NSTEMI at that time. Rate control/anticoag.  . Alzheimer's dementia   . Endometrial hyperplasia with atypia 1995  . Personal history of colonic polyps 2000  . Hyperlipidemia   . Hypertension   . Macular degeneration   . Osteoarthritis   . Osteopenia   . NSTEMI (non-ST elevated myocardial infarction) 02/2012    a. Cath: large filling defect apical LAD suggestive of embolus - LM, RCA, LCx OK. Cath complicated by delirium  . Insomnia 11/16/2012  . Coronary embolism     a. 02/2012 with NSTEMI (see above).  . Cardiomyopathy     a. EF 45% by cath 02/2012, 30% by echocardiogram.    Current Outpatient Prescriptions  Medication Sig Dispense Refill  . aspirin 81 MG tablet Take 1 tablet (81 mg total) by mouth daily.      . Cholecalciferol (VITAMIN D) 1000 UNITS capsule Take 1,000 Units by mouth daily.      Marland Kitchen. diltiazem (DILACOR XR) 240 MG 24 hr capsule Take 240 mg by mouth daily.      Marland Kitchen. lisinopril (PRINIVIL,ZESTRIL) 10 MG tablet Take 10 mg by mouth daily.      . Memantine HCl ER (NAMENDA XR) 28 MG CP24 Take 28 mg by mouth daily.  30 capsule  5  . metoprolol succinate (TOPROL-XL) 50 MG 24 hr tablet Take 50 mg by mouth at bedtime.      Marland Kitchen. omeprazole (PRILOSEC) 40 MG capsule Take one capsule once daily for reflux  30 capsule  5  .  zolpidem (AMBIEN) 5 MG tablet Take 1 tablet (5 mg total) by mouth at bedtime.      . [DISCONTINUED] Omeprazole (PRILOSEC PO) Take 1 tablet by mouth daily as needed.       No current facility-administered medications for this visit.    Allergies:   Codeine and Zolpidem   Social History:  The patient  reports that she quit smoking about 40 years ago. She has never used smokeless tobacco. She reports that she drinks about 0.6 ounces of alcohol per week.   Family History:  The patient's family history includes Stroke in her mother.   ROS:  Please see the history of present illness.   All other systems reviewed and negative.   PHYSICAL EXAM:  VS:  BP  110/70  Pulse 84  Ht 5\' 6"  (1.676 m)  Wt 155 lb (70.308 kg)  BMI 25.03 kg/m2 Well nourished, eldery WF in no acute distress HEENT: normal Neck: no JVD Cardiac:  Irregularly irregular, rate controlled, S1, S2; RRR; no murmur Lungs:  clear to auscultation bilaterally, no wheezing, rhonchi or rales Abd: soft, nontender, no hepatomegaly Ext: no edema Skin: warm and dry Neuro:  moves all extremities spontaneously, no focal abnormalities noted, hard of hearing  EKG:  Atrial fibrillation 84bpm, nonspecific ST and T wave changes, less pronounced than May 23, 2012 ECG  ASSESSMENT AND PLAN:  1. Atrial fibrillation, chronic - CHADSVASC score is 6 but she was taken off anticoagulation at direction of PCP due to gait instability/fall risk and med noncompliance. At last primary care office visit, there was concern that she may be taking two days worth of medications at one time. She denies that today. She does endorse continued gait instability and fear of falling. Discussed with Dr. Jens Som and we will continue current regimen. Rate is controlled and she is feeling well. Although CCB is less preferred given history of LV dysfunction, she is tolerating her medications without complication so we will leave as is. 2. HTN - controlled. No recent labs. Check BMET given lisinopril. 3. Ischemic cardiomyopathy likely due to coronary embolism 02/2012 - well-compensated today. 4. Alzheimer's dementia - oriented during our office visit. There are plans per PCP note to shift to assisted living soon.  Dispo: follow-up Dr. Jens Som in 6 months.  Signed, Ronie Spies, PA-C  08/04/2013 11:49 AM

## 2013-08-05 ENCOUNTER — Other Ambulatory Visit: Payer: Self-pay | Admitting: *Deleted

## 2013-08-05 ENCOUNTER — Encounter: Payer: Self-pay | Admitting: Geriatric Medicine

## 2013-08-05 MED ORDER — DILTIAZEM HCL ER 240 MG PO CP24: ORAL_CAPSULE | ORAL | Status: AC

## 2013-08-10 ENCOUNTER — Other Ambulatory Visit: Payer: Self-pay | Admitting: Internal Medicine

## 2013-08-10 ENCOUNTER — Non-Acute Institutional Stay: Payer: Medicare Other | Admitting: Internal Medicine

## 2013-08-10 ENCOUNTER — Encounter: Payer: Self-pay | Admitting: Internal Medicine

## 2013-08-10 VITALS — BP 130/78 | HR 80 | Wt 156.0 lb

## 2013-08-10 DIAGNOSIS — F411 Generalized anxiety disorder: Secondary | ICD-10-CM

## 2013-08-10 DIAGNOSIS — I4891 Unspecified atrial fibrillation: Secondary | ICD-10-CM

## 2013-08-10 DIAGNOSIS — G4700 Insomnia, unspecified: Secondary | ICD-10-CM

## 2013-08-10 DIAGNOSIS — F0280 Dementia in other diseases classified elsewhere without behavioral disturbance: Secondary | ICD-10-CM

## 2013-08-10 DIAGNOSIS — R634 Abnormal weight loss: Secondary | ICD-10-CM

## 2013-08-10 DIAGNOSIS — I10 Essential (primary) hypertension: Secondary | ICD-10-CM

## 2013-08-10 DIAGNOSIS — G309 Alzheimer's disease, unspecified: Secondary | ICD-10-CM

## 2013-08-10 MED ORDER — MIRTAZAPINE 15 MG PO TABS: ORAL_TABLET | ORAL | Status: DC

## 2013-08-10 NOTE — Progress Notes (Signed)
Patient ID: [name], female   DOB: 1922/01/12, 80 y.o.   MRN: [id number]    Location:  [facility] [employer] [facility] (12)    Allergies  Allergen Reactions  . Codeine   . Zolpidem Other (See Comments)    Becomes agitated and hallicuinates    Chief Complaint  Patient presents with  . Medical Managment of Chronic Issues    blood pressure, insomnia, Alzheimer, A-Fib, anxiety    HPI:  Poor appetite. Lost 6# since July 2014.  Hypertension: controlled  Atrial fibrillation: rate controlled  Alzheimer disease: unchanged. Prior MMSE 13/30  Insomnia: unchnged  Generalized anxiety disorder: improved    Medications: Patient's Medications  New Prescriptions   No medications on file  Previous Medications   ASPIRIN 81 MG TABLET    Take 1 tablet (81 mg total) by mouth daily.   CHOLECALCIFEROL (VITAMIN D) 1000 UNITS CAPSULE    Take 1,000 Units by mouth daily.   DILTIAZEM (DILACOR XR) 240 MG 24 HR CAPSULE    Take one tablet by mouth once daily   LISINOPRIL (PRINIVIL,ZESTRIL) 10 MG TABLET    Take 10 mg by mouth daily.   MEMANTINE HCL ER (NAMENDA XR) 28 MG CP24    Take 28 mg by mouth daily.   METOPROLOL SUCCINATE (TOPROL-XL) 50 MG 24 HR TABLET    Take 50 mg by mouth at bedtime.   OMEPRAZOLE (PRILOSEC) 40 MG CAPSULE    Take one capsule once daily for reflux   ZOLPIDEM (AMBIEN) 5 MG TABLET    Take 1 tablet (5 mg total) by mouth at bedtime.  Modified Medications   No medications on file  Discontinued Medications   No medications on file     Review of Systems  Constitutional: Positive for appetite change. Negative for fever, fatigue and unexpected weight change.  HENT: Negative.   Eyes: Negative.   Respiratory: Negative.   Cardiovascular: Negative for chest pain, palpitations and leg swelling.       Hx NSTEMI.  Gastrointestinal:       Anorexia  Genitourinary: Negative.   Musculoskeletal: Positive for arthralgias.  Skin: Negative.     Psychiatric/Behavioral: Positive for sleep disturbance.    Filed Vitals:   08/10/13 1413  BP: 130/78  Pulse: 80  Weight: 156 lb (70.761 kg)   Physical Exam  Constitutional: She is oriented to person, place, and time. She appears well-developed and well-nourished. No distress.  HENT:  Right Ear: External ear normal.  Left Ear: External ear normal.  Nose: Nose normal.  Mouth/Throat: Oropharynx is clear and moist.  Eyes: Conjunctivae and EOM are normal.  Neck: No JVD present. No tracheal deviation present. No thyromegaly present.  Cardiovascular: Exam reveals no gallop and no friction rub.   No murmur heard. AF  Pulmonary/Chest: Effort normal and breath sounds normal. No respiratory distress. She has no wheezes. She has no rales. She exhibits no tenderness.  Abdominal: Soft. Bowel sounds are normal. She exhibits no distension and no mass. There is no tenderness.  Musculoskeletal: She exhibits no edema and no tenderness.  Lymphadenopathy:    She has no cervical adenopathy.  Neurological: She is alert and oriented to person, place, and time. No cranial nerve deficit. Coordination normal.  Memory deficit. Only scored 13/30 on MMSE and failed clock drawing on 11/06/12.  Skin: No rash noted. No erythema. No pallor.  Psychiatric:  Fidgety. Agitated about her problem with insomnia.     Labs reviewed: Office Visit  on 08/04/2013  Component Date Value Ref Range Status  . Sodium 08/04/2013 143  135 - 145 mEq/L Final  . Potassium 08/04/2013 3.8  3.5 - 5.1 mEq/L Final  . Chloride 08/04/2013 107  96 - 112 mEq/L Final  . CO2 08/04/2013 29  19 - 32 mEq/L Final  . Glucose, Bld 08/04/2013 83  70 - 99 mg/dL Final  . BUN 16/10/960402/24/2015 28* 6 - 23 mg/dL Final  . Creatinine, Ser 08/04/2013 0.9  0.4 - 1.2 mg/dL Final  . Calcium 54/09/811902/24/2015 9.2  8.4 - 10.5 mg/dL Final  . GFR 14/78/295602/24/2015 62.55  >60.00 mL/min Final  Nursing Home on 06/10/2013  Component Date Value Ref Range Status  . Hemoglobin  11/09/2012 13.0  12.0 - 16.0 g/dL Final  . HCT 21/30/865706/06/2012 39  36 - 46 % Final  . Platelets 11/09/2012 227  150 - 399 K/L Final  . Glucose 11/09/2012 95   Final  . Creatinine 11/09/2012 0.7  0.5 - 1.1 mg/dL Final  . Potassium 84/69/629506/06/2012 4.0  3.4 - 5.3 mmol/L Final  . Sodium 11/09/2012 144  137 - 147 mmol/L Final  . Alkaline Phosphatase 11/09/2012 61  25 - 125 U/L Final  . ALT 11/09/2012 18  7 - 35 U/L Final  . AST 11/09/2012 21  13 - 35 U/L Final      Assessment/Plan  1. Hypertension controlled  2. Atrial fibrillation Rate controlled  3. Alzheimer disease unchanged  4. Insomnia unchanged  5. Generalized anxiety disorder improved  6. Weight loss Add mirtazipine

## 2013-08-28 ENCOUNTER — Ambulatory Visit: Payer: Medicare Other | Admitting: Physician Assistant

## 2013-09-02 ENCOUNTER — Non-Acute Institutional Stay: Payer: Medicare Other | Admitting: Geriatric Medicine

## 2013-09-02 ENCOUNTER — Encounter: Payer: Self-pay | Admitting: Geriatric Medicine

## 2013-09-02 VITALS — BP 138/80 | HR 84 | Temp 97.1°F | Ht 65.0 in | Wt 158.0 lb

## 2013-09-02 DIAGNOSIS — I10 Essential (primary) hypertension: Secondary | ICD-10-CM

## 2013-09-02 DIAGNOSIS — G4700 Insomnia, unspecified: Secondary | ICD-10-CM

## 2013-09-02 DIAGNOSIS — F0280 Dementia in other diseases classified elsewhere without behavioral disturbance: Secondary | ICD-10-CM

## 2013-09-02 DIAGNOSIS — I4891 Unspecified atrial fibrillation: Secondary | ICD-10-CM

## 2013-09-02 DIAGNOSIS — G309 Alzheimer's disease, unspecified: Secondary | ICD-10-CM

## 2013-09-02 NOTE — Progress Notes (Signed)
Patient ID: [name], female   DOB: Nov 23, 1921, 80 y.o.   MRN: [id number]   [facility] [phone])  Code Status: Full Code Contact Information   Name Relation Home Work Mobile   Doyline Daughter [phone]  [phone]   [name] Daughter [phone]  [phone]       Chief Complaint  Patient presents with  . Medical Managment of Chronic Issues    Comprehensive exam: A-fib, anxiety, Alzheimer's, blood pressure    HPI: This is a 80 y.o. female resident of [facility] recently moved from Independent Living to Assisted Living section. This patient  has not had a hospitalization, serious illness or injury in the last year. Patient has demonstrated decline due to Alzheimer disease. The patient's only complaint today is poor sleep and poor appetite. Tells me "that little pink pill does help me sleep a little bit but hasn't helped my appetite". Nursing staff is to manage medication while living in AL setting. Nurse  reports patient has aspirin, Advil PM, and Aleve in her apartment, and insists she will take these medications as she needs them. Daughter have been involved in this transition, have given nursing staff detailed instructions and requests re: ongoing care of this patient.  Recent visits: 06/10/2014 Alzheimer disease Patient with memory/cognitive deficits, probable Alzheimer's type dementia, moderate stage (11/2012 MMSE 13/ 30, failed clock test).  Independent with basic ADLS, requires assistance with higher functioning activities including medication management.  Patient has no insight into her memory/cognitive problems, is easily agitated.  Four days of medication are unaccounted for; could be cause of increased confusion. Strongly recommend daily management/ monitoring of medication use. D/C Ambien as this medication likely to cause adverse effects. Patient/ daughter are in agreement with this plan today  07/15/2013 Atrial  fibrillation Heart in irregular rhythm, rate a bit elevated. Increase diltiazem, re-evalauate next week Generalized anxiety disorder Increased anxiety/ agitation re: memory issues and plan for move to AL. Pt. reports she is not moving and is feeling better. Family/[facility] plan remians inplace for move to [place] in the near future (March). Will consider adding Cymbalta next week if AF is better controlled.  07/22/2013 Atrial fibrillation Heart remains irregular, rate is variable range from 84-103 during visit today. Patient is taking both calcium channel blocker and beta blocker in the morning, will separate these medications for better overall effect.   Since last visit patient was evaluated by Dr.Green, noted anxiety appeared better, no medication change was made.      Allergies  Allergen Reactions  . Codeine     MEDICATIONS -     Medication List       This list is accurate as of: 09/02/13 11:04 AM.  Always use your most recent med list.               aspirin 81 MG tablet  Take 1 tablet (81 mg total) by mouth daily.     diltiazem 240 MG 24 hr capsule  Commonly known as:  DILACOR XR  Take one tablet by mouth once daily     lisinopril 10 MG tablet  Commonly known as:  PRINIVIL,ZESTRIL  Take 10 mg by mouth daily.     Memantine HCl ER 28 MG Cp24  Commonly known as:  NAMENDA XR  Take 28 mg by mouth daily.     metoprolol succinate 50 MG 24 hr tablet  Commonly known as:  TOPROL-XL  Take 50 mg by mouth at bedtime.  mirtazapine 15 MG tablet  Commonly known as:  REMERON  One at bed to stimulate appetite and help rest     omeprazole 40 MG capsule  Commonly known as:  PRILOSEC  Take one capsule once daily for reflux     Vitamin D 1000 UNITS capsule  Take 1,000 Units by mouth daily.     zolpidem 5 MG tablet  Commonly known as:  AMBIEN  Take 1 tablet (5 mg total) by mouth at bedtime.         DATA REVIEWED  Radiologic Exams:   Cardiovascular Exams:    Laboratory Studies: Lab Results  Component Value Date   WBC 8.9 11/17/2012   HGB 13.0 11/09/2012   HCT 39 11/09/2012   MCV 88.4 03/13/2012   PLT 227 11/09/2012   Lab Results  Component Value Date   NA 143 08/04/2013   K 3.8 08/04/2013   GLU 95 11/09/2012   BUN 28* 08/04/2013   CREATININE 0.9 08/04/2013   Lab Results  Component Value Date   NA 143 08/04/2013   K 3.8 08/04/2013   CL 107 08/04/2013   CO2 29 08/04/2013   GLUCOSE 83 08/04/2013   BUN 28* 08/04/2013   CREATININE 0.9 08/04/2013   CALCIUM 9.2 08/04/2013   ALBUMIN 3.4* 02/18/2012   AST 21 11/09/2012   ALT 18 11/09/2012   ALKPHOS 61 11/09/2012   BILITOT 0.3 02/18/2012   GFRNONAA 59* 02/21/2012   GFRAA 69* 02/21/2012   No results found for this basename: vitaminb12, vitamind        Past Medical History  Diagnosis Date  . Gastroesophageal reflux disease   . Atrial fibrillation 2013    a. Dx 02/2012, had NSTEMI at that time. Rate control/anticoag.  . Alzheimer's dementia   . Endometrial hyperplasia with atypia 1995  . Personal history of colonic polyps 2000  . Hyperlipidemia   . Hypertension   . Macular degeneration   . Osteoarthritis   . Osteopenia   . NSTEMI (non-ST elevated myocardial infarction) 02/2012    a. Cath: large filling defect apical LAD suggestive of embolus - LM, RCA, LCx OK. Cath complicated by delirium  . Insomnia 11/16/2012  . Coronary embolism     a. 02/2012 with NSTEMI (see above).  . Cardiomyopathy     a. EF 45% by cath 02/2012, 30% by echocardiogram.   Past Surgical History  Procedure Laterality Date  . Appendectomy  1977  . Abdominal hysterectomy  1995    TAH/BSO   Family Status  Relation Status Death Age  . Mother Deceased   . Father Deceased   . Daughter Alive   . Sister Alive   . Daughter Alive    History   Social History Narrative    [place] at [facility] [place]. [place]oved [place] 08/2013   Widowed   Former smoker, quit 1970s, drinks wine daily   Advanced  Directives: Living Will   Exercise routine at exercise room couple days a week     REVIEW OF SYSTEMS DATA OBTAINED: from patient, nurse, medical record GENERAL: Feels well "except I can't sleep, don't eat very much.."  No recent fever, fatigue, change in appetite or weight SKIN: No itch, rash or open wounds EYES: No eye pain, dryness or itching  No change in vision EARS: No earache, tinnitus, change in hearing NOSE: No congestion, drainage or bleeding MOUTH/THROAT: No mouth or tooth pain  No sore throat   No difficulty chewing or swallowing  RESPIRATORY: No cough, wheezing, SOB CARDIAC: No chest pain, palpitations  No edema. CHEST/BREASTS: No discomfort, discharge or lumps in breasts GI: No abdominal pain  No nausea, vomiting,diarrhea or constipation  No heartburn or reflux  GU: No dysuria, frequency or urgency  No change in urine volume or character   MUSCULOSKELETAL: No joint pain, swelling or stiffness  No back pain  No muscle ache, pain, weakness  Gait is steady  No recent falls.  NEUROLOGIC: No dizziness, fainting, headache, numbness  No change in mental status (STML, patient denies).  PSYCHIATRIC: No feelings of anxiety, depression  Does not sleeps well,   No behavior issue.    PHYSICAL EXAM Filed Vitals:   09/02/13 0950  BP: 138/80  Pulse: 84  Temp: 97.1 F (36.2 C)  TempSrc: Oral  Height: 5\' 5"  (1.651 m)  Weight: 158 lb (71.668 kg)   Body mass index is 26.29 kg/(m^2).  GENERAL APPEARANCE: No acute distress, appropriately groomed, normal body habitus. Alert, pleasant, conversant. SKIN: No diaphoresis, rash, unusual lesions, wounds HEAD: Normocephalic, atraumatic EYES: Conjunctiva/lids clear. Pupils round, reactive. EOMs intact.  EARS: External exam WNL, canals clear, RT. TM WNL, left w/o light reflex. Poor Hearing evident   NOSE: No deformity or discharge. MOUTH/THROAT: Lips w/o lesions. Oral mucosa, tongue moist, w/o lesion. Oropharynx w/o redness or lesions.  NECK:  Supple, full ROM. No thyroid tenderness, enlargement or nodule LYMPHATICS: No head, neck or supraclavicular adenopathy RESPIRATORY: Breathing is even, unlabored. Lung sounds are clear and full.  CHEST/BREASTS: No chest deformity. Breasts without tenderness, mass, discharge CARDIOVASCULAR: Heart IRRR. No murmur or extra heart sounds  ARTERIAL: No carotid bruit. Carotid 2+, Femoral 1+, Popliteal absent, DP/PT pulse 2+.  VENOUS: Varicosities present left LE. No venous stasis skin changes  EDEMA: No peripheral edema.  GASTROINTESTINAL: Abdomen is soft, non-tender, not distended w/ normal bowel sounds. No hepatic or splenic enlargement. No mass, ventral or inguinal hernia. MUSCULOSKELETAL: Moves all extremities with full ROM, strength and tone. Back is without kyphosis, scoliosis or spinal process tenderness. Gait is steady NEUROLOGIC: Not oriented to time, place. Is oriented to person. Is unaware of STML, becomes angry when mentioned. Cranial nerves 2-12 grossly intact, speech clear, no tremor. Patella, brachial DTR 1+. PSYCHIATRIC: Mood and affect appropriate to situation   ASSESSMENT/PLAN  Atrial fibrillation Heart remains in irregular rhythm, rate well controlled. Patient was previously anticoagulated with Pradaxa, medication stopped by previous PCP due to unstable gait and poor compliance with medication. Could consider restarting anticoagulation for stroke risk reduction since nursing staff is managing medication. She will be at higher risk for bleeding complication however because of non supervised use of ASA, NSAID.  Hypertension BP has ben in reasonable control since move to [place]  Alzheimer disease Patient remains unaware of her cognitive deficits, becomes very defensive if discussed. Nursing staff notes patient requires assist with time orientation, dificulty reading the clock,use of TV remote. Requires some assist with personal care. Most medication is managed by nursing staff, patient  continues tohave ASA, Advil PM, and Aleve in her room. Caregiver reports she has removed these medications in the recent past but patient has insisted in buying mor when out to the store. Patient is at risk for GI side effects from ASA, NSAID as well as cholinergic side effects from benadryl in Advil PM. Nursing staff tells me daughters are aware of this situation.  Insomnia Patient is currently receiving Ambien and mirtazapine at bedtime to help sleep. She reports falling asleep OK, but awakens  too early. Do not recommend increasing sleeping medication.  She is at risk for cholinergic side effects from benadryl in Advil PM.    Family/ staff Communication:     Goals of care:    Maintain safety, dignity in AL setting   Labs/tests ordered:    Follow up: Return in about 2 months (around 11/16/2013) for OV, Memory, inosmnia w/ dr.Green.   Toribio Harbourlaudette T.Isabell Bonafede, NP-C [facility] [phone]  09/02/2013

## 2013-09-04 NOTE — Assessment & Plan Note (Addendum)
Patient is currently receiving Ambien and mirtazapine at bedtime to help sleep. She reports falling asleep OK, but awakens too early. Do not recommend increasing sleeping medication.  She is at risk for cholinergic side effects from benadryl in Advil PM.

## 2013-09-04 NOTE — Assessment & Plan Note (Signed)
Patient remains unaware of her cognitive deficits, becomes very defensive if discussed. Nursing staff notes patient requires assist with time orientation, dificulty reading the clock,use of TV remote. Requires some assist with personal care. Most medication is managed by nursing staff, patient continues tohave ASA, Advil PM, and Aleve in her room. Caregiver reports she has removed these medications in the recent past but patient has insisted in buying mor when out to the store. Patient is at risk for GI side effects from ASA, NSAID as well as cholinergic side effects from benadryl in Advil PM. Nursing staff tells me daughters are aware of this situation.

## 2013-09-04 NOTE — Assessment & Plan Note (Signed)
Heart remains in irregular rhythm, rate well controlled. Patient was previously anticoagulated with Pradaxa, medication stopped by previous PCP due to unstable gait and poor compliance with medication. Could consider restarting anticoagulation for stroke risk reduction since nursing staff is managing medication. She will be at higher risk for bleeding complication however because of non supervised use of ASA, NSAID.

## 2013-09-04 NOTE — Assessment & Plan Note (Signed)
BP has ben in reasonable control since move to [place]

## 2013-10-05 ENCOUNTER — Encounter: Payer: Self-pay | Admitting: Geriatric Medicine

## 2013-10-05 ENCOUNTER — Non-Acute Institutional Stay: Payer: Medicare Other | Admitting: Geriatric Medicine

## 2013-10-05 DIAGNOSIS — R110 Nausea: Secondary | ICD-10-CM

## 2013-10-05 NOTE — Progress Notes (Signed)
Patient ID: [name], female   DOB: [birth year], 80 y.o.   MRN: [id number]   [facility] [phone])  Code Status: Full Code Contact Information   Name Relation Home Work Mobile   [place] Daughter [phone]  [phone]   [name] Daughter [phone]  [phone]       Chief Complaint  Patient presents with  . GI complaints    HPI: This is a 80 y.o. female resident of [facility], [facility]isted Living  section.  Evaluation is requested today due to GI complaints. Patient reports she has "regular" BM nearly daily, then "a clean out" about every 2 weeks.  C/O persistent poor appetite, often feels nauseous.  Cannot tell me what she usually eats.      Allergies  Allergen Reactions  . Codeine     MEDICATIONS -  Reviewed  DATA REVIEWED  Radiologic Exams  Cardiovascular Exams  Laboratory Studies Lab Results  Component Value Date   WBC 8.9 11/17/2012   HGB 13.0 11/09/2012   HCT 39 11/09/2012   MCV 88.4 03/13/2012   PLT 227 11/09/2012   Lab Results  Component Value Date   NA 143 08/04/2013   K 3.8 08/04/2013   GLU 95 11/09/2012   BUN 28* 08/04/2013   CREATININE 0.9 08/04/2013   Lab Results  Component Value Date   CALCIUM 9.2 08/04/2013   ALBUMIN 3.4* 02/18/2012   AST 21 11/09/2012   ALT 18 11/09/2012   ALKPHOS 61 11/09/2012   BILITOT 0.3 02/18/2012   GFRNONAA 59* 02/21/2012   GFRAA 69* 02/21/2012   No results found for this basename: vitaminb12, vitamind     REVIEW OF SYSTEMS  DATA OBTAINED: from patient, medica record GENERAL: Feels "OK"   No recent fever, fatigue. Persistent poor appetite, no weight change SKIN: No itch, rash or open wounds MOUTH/THROAT: No mouth or tooth pain  No sore throat   No difficulty chewing or swallowing RESPIRATORY: No cough, wheezing, SOB CARDIAC: No chest pain, palpitations  No edema. GI: No abdominal pain  C/o mild nausea, irregular BMs  See HPI No vomiting No heartburn or reflux  GU: No dysuria,  frequency or urgency  No change in urine volume or character    MUSCULOSKELETAL:  Gait is unsteady  Most recent fall 4/19, lost balance , no injury.  NEUROLOGIC: No dizziness, fainting, headache,  No change in mental status (dementia).  PSYCHIATRIC: No report of increasede anxiety      PHYSICAL EXAM Filed Vitals:   10/05/13 1229  BP: 141/81  Pulse: 99  Temp: 97.6 F (36.4 C)  Resp: 22  Weight: 159 lb 3.2 oz (72.213 kg)  SpO2: 94%   Body mass index is 26.49 kg/(m^2).  GENERAL APPEARANCE: No acute distress, appropriately groomed, normal body habitus. Alert, pleasant, conversant. SKIN: No diaphoresis, rash, unusual lesions, wounds HEAD: Normocephalic, atraumatic EYES: Conjunctiva/lids clear.Marland Kitchen  EARS:  Hearing grossly normal. NOSE: No deformity or discharge. RESPIRATORY: Breathing is even, unlabored. Lung sounds are clear and full.  CARDIOVASCULAR: Heart IRRR, HR 84. No murmur or extra heart sounds  EDEMA: No peripheral edema GASTROINTESTINAL: Abdomen is soft, non-tender, not distended w/ normal bowel sounds. No hepatic or splenic enlargement. No mass, ventral or inguinal hernia. GENITOURINARY: Bladder non tender, not distended. MUSCULOSKELETAL: Moves all extremities with full ROM, strength and tone. Gait is unsteady NEUROLOGIC: Oriented to time, place, person. Does not recall clinic visits with me Feb/March  "I haven't seen you in a year" Speech clear, no tremor.  Patella, brachial DTR 2+. PSYCHIATRIC: Mood and affect appropriate to situation   ASSESSMENT/PLAN  Nausea alone Patient has c/o poor appetite for some time, has been receiving miratazapine, unclear if this is helping. Unable to ascertain what/ how much patient hs ben eating. Will ask dietary staff to assist.  Today is first mention of nausea and irregular BMs - unsure how accurate patient's memory is re: daily BMs. Physical exam unremarkable. Symptoms may be re: use of NSAID/or irregular eating habits  Will obtain AP xray of  abdomen/ lab to r/o acute problem.     Family/ staff Communication:  Daughter Larita FifeLynn present for interview and exam, agrees with plan.  Time: 30minutes, >50% spent counseling/or care coordination  Labs/tests ordered: Abdominal X-ray, CBC, CMP , amylase, lipase   Follow up: Return for As needed.  Toribio Harbourlaudette T.Joliyah Lippens, NP-C [facility] [phone]  10/05/2013

## 2013-10-05 NOTE — Assessment & Plan Note (Addendum)
Patient has c/o poor appetite for some time, has been receiving miratazapine, unclear if this is helping. Unable to ascertain what/ how much patient hs ben eating. Will ask dietary staff to assist.  Today is first mention of nausea and irregular BMs - unsure how accurate patient's memory is re: daily BMs. Physical exam unremarkable. Symptoms may be re: use of NSAID/or irregular eating habits  Will obtain AP xray of abdomen/ lab to r/o acute problem.

## 2013-10-06 ENCOUNTER — Encounter: Payer: Self-pay | Admitting: Internal Medicine

## 2013-10-06 LAB — BASIC METABOLIC PANEL
BUN: 22 mg/dL — AB (ref 4–21)
Creatinine: 0.9 mg/dL (ref 0.5–1.1)
GLUCOSE: 94 mg/dL
POTASSIUM: 4.3 mmol/L (ref 3.4–5.3)
Sodium: 144 mmol/L (ref 137–147)

## 2013-10-06 LAB — CBC AND DIFFERENTIAL
HEMATOCRIT: 36 % (ref 36–46)
HEMOGLOBIN: 12.3 g/dL (ref 12.0–16.0)
Platelets: 318 10*3/uL (ref 150–399)
WBC: 6.9 10^3/mL

## 2013-10-06 LAB — HEPATIC FUNCTION PANEL
ALK PHOS: 109 U/L (ref 25–125)
ALT: 12 U/L (ref 7–35)
AST: 12 U/L — AB (ref 13–35)
BILIRUBIN, TOTAL: 0.3 mg/dL

## 2013-10-08 ENCOUNTER — Encounter: Payer: Self-pay | Admitting: Geriatric Medicine

## 2013-10-08 ENCOUNTER — Non-Acute Institutional Stay: Payer: Medicare Other | Admitting: Geriatric Medicine

## 2013-10-08 DIAGNOSIS — R110 Nausea: Secondary | ICD-10-CM

## 2013-10-08 NOTE — Progress Notes (Signed)
Patient ID: [name], female   DOB: 02/14/1922, 80 y.o.   MRN: [id number]   [facility] [phone])  Code Status: Full Code  Contact Information   Name Relation Home Work Mobile   [place],[name] [phone]  [phone]   [name],[name] [phone]  [phone]       Chief Complaint  Patient presents with  . F/U lab, X-ray, GI distress    HPI: This is a 80 y.o. female resident of [facility], [facility]isted [facility]  Section as CTA in her apartment and followup of labs x-rays and medication review related to patient's complaint of GI distress. Last visit: Nausea alone Patient has c/o poor appetite for some time, has been receiving miratazapine, unclear if this is helping. Unable to ascertain what/ how much patient hs ben eating. Will ask dietary staff to assist.  Today is first mention of nausea and irregular BMs - unsure how accurate patient's memory is re: daily BMs. Physical exam unremarkable. Symptoms may be re: use of NSAID/or irregular eating habits  Will obtain AP xray of abdomen/ lab to r/o acute problem.  Since last visit patient has not experienced any nausea or loose bowel movements. Laboratory studies were satisfactory, x-ray was essentially normal exam. Review of patient's medications include that she has aspirin and Advil and her rhythm. It's unclear how much of this medication she is taking.       Allergies  Allergen Reactions  . Codeine     MEDICATIONS -  Reviewed  DATA REVIEWED  Radiologic Exams  10/05/2013 Supine abdomen: No bowel obstruction or ileus. Subcentimeter calcifications of the right lower pelvis likely vascular/degenerative   Cardiovascular Exams  Laboratory Studies Lab Results  Component Value Date   WBC 6.9 10/06/2013   HGB 12.3 10/06/2013   HCT 36 10/06/2013   MCV 88.4 03/13/2012   PLT 318 10/06/2013   Lab Results  Component Value Date   NA 144 10/06/2013   K 4.3 10/06/2013   GLU 94  10/06/2013   BUN 22* 10/06/2013   CREATININE 0.9 10/06/2013   Lab Results  Component Value Date   CALCIUM 9.4 10/06/2013   ALBUMIN 3.7 10/06/2013   AST 12 10/06/2013   ALT 12 10/06/2013   ALKPHOS 109 10/06/2013   BILITOT 0.3 10/07/2103   AMylase 41 10/07/2103   Lipase 26 10/07/2103   No results found for this basename: vitaminb12,  vitamind     REVIEW OF SYSTEMS  DATA OBTAINED: from patient, medica record GENERAL: Feels "very well"   No recent fever, fatigue. Persistent poor appetite, no weight change GI: No abdominal pain  No c/o nausea today  No vomiting No heartburn or reflux  GU: No dysuria, frequency or urgency  No change in urine volume or character    MUSCULOSKELETAL:  Gait is unsteady  Most recent fall 4/19, lost balance , no injury.  NEUROLOGIC: No dizziness, fainting, headache,  No change in mental status (dementia).  PSYCHIATRIC: No report of increasede anxiety      PHYSICAL EXAM Filed Vitals:   10/08/13 1458  BP: 141/81  Pulse: 99  Temp: 97.6 F (36.4 C)  Resp: 22  SpO2: 97%   There is no weight on file to calculate BMI.  GENERAL APPEARANCE: No acute distress, appropriately groomed, normal body habitus. Alert, pleasant, conversant. SKIN: No diaphoresis, rash,   HEAD: Normocephalic, atraumatic EYES: Conjunctiva/lids clear.Marland Kitchen.  EARS:  Hearing grossly normal. RESPIRATORY: Breathing is even, unlabored. NEUROLOGIC: Oriented to time, place, person.  Does not recall visit 2 days ago  PSYCHIATRIC: Mood and affect appropriate to situation   ASSESSMENT/PLAN  Nausea alone Patient denies nausea today. Lab and x-ray unremarkable. Have recommended to patient she stop using Advil and stop aspirin except for what is administered by the nursing staff. Explained that these 2 medications often cause GI upset.   Left a written note with the patient and her apartment with lab and x-ray results and my recommendation.    Family/ staff Communication:   Time:  Labs/tests ordered:    Follow up: Return for As scheduled.  Toribio Harbour, NP-C [facility] [phone]  10/08/2013

## 2013-10-08 NOTE — Assessment & Plan Note (Signed)
Patient denies nausea today. Lab and x-ray unremarkable. Have recommended to patient she stop using Advil and stop aspirin except for what is administered by the nursing staff. Explained that these 2 medications often cause GI upset.   Left a written note with the patient and her apartment with lab and x-ray results and my recommendation.

## 2013-10-14 ENCOUNTER — Ambulatory Visit: Payer: Medicare Other | Admitting: Nurse Practitioner

## 2013-10-14 ENCOUNTER — Non-Acute Institutional Stay: Payer: Medicare Other | Admitting: Geriatric Medicine

## 2013-10-14 ENCOUNTER — Encounter: Payer: Self-pay | Admitting: Geriatric Medicine

## 2013-10-14 VITALS — BP 122/78 | HR 72 | Temp 98.4°F | Wt 155.0 lb

## 2013-10-14 DIAGNOSIS — F411 Generalized anxiety disorder: Secondary | ICD-10-CM

## 2013-10-14 DIAGNOSIS — I10 Essential (primary) hypertension: Secondary | ICD-10-CM

## 2013-10-14 DIAGNOSIS — I4891 Unspecified atrial fibrillation: Secondary | ICD-10-CM

## 2013-10-14 NOTE — Progress Notes (Signed)
Patient ID: [name], female   DOB: 03/01/1922, 80 y.o.   MRN: [id number]   [facility] [phone](12)  Code Status: Full Code  Contact Information   Name Relation Home Work Mobile   [name],Kathryn Daughter [phone]  [phone]   [name],Kathryn Daughter [phone]  [phone]       Chief Complaint  Patient presents with  . Dizziness    when she gets up, her legs are weak, so afraid of falling    HPI: This is a 80 y.o. female resident of [facility]  Section evaluated in clinic today do to dizziness. Patient's daughters have also requested further evaluation and medication change to address patient's anxiety and associated behaviors Last visit: Nausea alone Patient denies nausea today. Lab and x-ray unremarkable. Have recommended to patient she stop using Advil and stop aspirin except for what is administered by the nursing staff. Explained that these 2 medications often cause GI upset.   Left a written note with the patient and her apartment with lab and x-ray results and my recommendation.  SInce last visit patient has had no further GI complaint. She has been bothered by dizziness. She feels dizzy when she gets up and her legs are weak. She is very afraid of falling. Review of facility record shows blood pressure range satisfactory, pulse rate is 70-90 beats per minute.  Pharmacokinetics testing shows patient is ultra metabolizer of metoprolol.  Both daughters have expressed continued concern about their mother's anxiety and agitation. Her primary caregiver, not yet is becoming increasingly frustrated with this patient's behavior. Patient has been receiving mirtazapine for quite some time to help with sleep appetite, had apparently has not had any effect on her mood and behavior. Daughter  [name] has requested consideration of Cymbalta to address anxiety.  The family is planning a trip to the beach soon and asks if their  mother is well enough to go along  Allergies  Allergen Reactions  . Codeine     MEDICATIONS -  Reviewed  DATA REVIEWED  Radiologic Exams  10/05/2013 Supine abdomen: No bowel obstruction or ileus. Subcentimeter calcifications of the right lower pelvis likely vascular/degenerative   Cardiovascular Exams  Laboratory Studies Lab Results  Component Value Date   WBC 6.9 10/06/2013   HGB 12.3 10/06/2013   HCT 36 10/06/2013   MCV 88.4 03/13/2012   PLT 318 10/06/2013   Lab Results  Component Value Date   NA 144 10/06/2013   K 4.3 10/06/2013   GLU 94 10/06/2013   BUN 22* 10/06/2013   CREATININE 0.9 10/06/2013   Lab Results  Component Value Date   CALCIUM 9.4 10/06/2013   ALBUMIN 3.7 10/06/2013   AST 12 10/06/2013   ALT 12 10/06/2013   ALKPHOS 109 10/06/2013   BILITOT 0.3 10/07/2103   AMylase 41 10/07/2103   Lipase 26 10/07/2103   No results found for this basename: vitaminb12,  vitamind     REVIEW OF SYSTEMS  DATA OBTAINED: from patient, medica record GENERAL: Feels well except for dizziness.   No recent fever, fatigue. Persistent poor appetite, no weight change MOUTH/THROAT: No mouth or tooth pain  No sore throat   No difficulty chewing or swallowing RESPIRATORY: No cough, wheezing, SOB CARDIAC: No chest pain, palpitations  No edema. GI: No abdominal pain  No c/o nausea today  No vomiting No heartburn or reflux  GU: No dysuria, frequency or urgency  No change in urine volume or character    MUSCULOSKELETAL:  Gait is unsteady  Most recent fall 4/19, lost balance , no injury.  NEUROLOGIC: Dizziness present, describes as unsteadiness No fainting, headache,  No change in mental status (dementia).  PSYCHIATRIC:  Recent reports of increased anxiety and agitated behavior   PHYSICAL EXAM Filed Vitals:   10/14/13 1523  BP: 122/78  Pulse: 72  Temp: 98.4 F (36.9 C)  TempSrc: Oral  Weight: 155 lb (70.308 kg)  SpO2: 99%   Body mass index is 25.79 kg/(m^2).  GENERAL APPEARANCE: No  acute distress, appropriately groomed, normal body habitus  Alert, pleasant, conversant. SKIN: No diaphoresis, rash, wound HEAD: Normocephalic, atraumatic EYES: Conjunctiva/lids clear  RESPIRATORY: Breathing is even, unlabored  Lung sounds are clear and full  CARDIOVASCULAR: Heart IRRR, tachycardic. Standing BP 118/72.  No murmur or extra heart sounds   EDEMA: No peripheral edema   MUSCULOSKELETAL.  Gait is unsteady PSYCHIATRIC: Mood and affect appropriate to situation    ASSESSMENT/PLAN  Atrial fibrillation Heart remains in irregular rhythm, rate elevated today, maybe contributing to her dizziness. Will change metoprolol to atenolol based on pharmacokinetic testing.   Hypertension Recent blood pressure range 136-159/7792. Today's blood pressure in clinic 122/78. Change to atenolol may provide better blood pressure control. Want to avoid hypotension. DC lisinopril  Generalized anxiety disorder Mirtazapine has been in place for some time not helping very much with appetite or mood and behavior. Will try Cymbalta, start 30 mg daily for 7 days then increase to 60 mg daily. DC mirtazapine.   Family/ staff Communication:  Have communicated today's findings and plan to daughters via multiple e-mail communications today..  Time: 40 minutes, >50% spent counseling/or care coordination  Labs/tests ordered:   Follow up: Return in about 5 weeks (around 11/16/2013) for As scheduled w/ Dr.Green.  Toribio Harbour, NP-C [facility] [phone]  10/14/2013

## 2013-10-15 MED ORDER — DULOXETINE HCL 30 MG PO CPEP: 30.0000 mg | ORAL_CAPSULE | ORAL | Status: DC

## 2013-10-15 NOTE — Assessment & Plan Note (Signed)
Recent blood pressure range 136-159/7792. Today's blood pressure in clinic 122/78. Change to atenolol may provide better blood pressure control. Want to avoid hypotension. DC lisinopril

## 2013-10-15 NOTE — Assessment & Plan Note (Signed)
Mirtazapine has been in place for some time not helping very much with appetite or mood and behavior. Will try Cymbalta, start 30 mg daily for 7 days then increase to 60 mg daily. DC mirtazapine.

## 2013-10-15 NOTE — Assessment & Plan Note (Signed)
Heart remains in irregular rhythm, rate elevated today, maybe contributing to her dizziness. Will change metoprolol to atenolol based on pharmacokinetic testing.

## 2013-10-19 ENCOUNTER — Encounter: Payer: Self-pay | Admitting: Internal Medicine

## 2013-10-19 ENCOUNTER — Non-Acute Institutional Stay: Payer: Medicare Other | Admitting: Internal Medicine

## 2013-10-19 VITALS — BP 122/72 | HR 56 | Temp 97.8°F | Wt 152.0 lb

## 2013-10-19 DIAGNOSIS — I429 Cardiomyopathy, unspecified: Secondary | ICD-10-CM

## 2013-10-19 DIAGNOSIS — F32A Depression, unspecified: Secondary | ICD-10-CM

## 2013-10-19 DIAGNOSIS — F3289 Other specified depressive episodes: Secondary | ICD-10-CM

## 2013-10-19 DIAGNOSIS — R634 Abnormal weight loss: Secondary | ICD-10-CM | POA: Insufficient documentation

## 2013-10-19 DIAGNOSIS — I10 Essential (primary) hypertension: Secondary | ICD-10-CM

## 2013-10-19 DIAGNOSIS — M6281 Muscle weakness (generalized): Secondary | ICD-10-CM | POA: Insufficient documentation

## 2013-10-19 DIAGNOSIS — I428 Other cardiomyopathies: Secondary | ICD-10-CM

## 2013-10-19 DIAGNOSIS — K219 Gastro-esophageal reflux disease without esophagitis: Secondary | ICD-10-CM

## 2013-10-19 DIAGNOSIS — F0280 Dementia in other diseases classified elsewhere without behavioral disturbance: Secondary | ICD-10-CM

## 2013-10-19 DIAGNOSIS — G309 Alzheimer's disease, unspecified: Secondary | ICD-10-CM

## 2013-10-19 DIAGNOSIS — F329 Major depressive disorder, single episode, unspecified: Secondary | ICD-10-CM

## 2013-10-19 DIAGNOSIS — F411 Generalized anxiety disorder: Secondary | ICD-10-CM

## 2013-10-20 LAB — POCT ERYTHROCYTE SEDIMENTATION RATE, NON-AUTOMATED: Sed Rate: 18 mm

## 2013-10-20 LAB — TSH: TSH: 2.54 u[IU]/mL (ref 0.41–5.90)

## 2013-10-21 ENCOUNTER — Telehealth: Payer: Self-pay | Admitting: Internal Medicine

## 2013-10-21 NOTE — Telephone Encounter (Signed)
Discussed lab by phone with heer 2 daughters, and then, ina separate call, with the patient. ESR, CK, and TSH all normal. Advised daughters I think heer issues are mainly emotional. They agree, Advised patient to struggle to be more active and get out of her room. She agrees to try.

## 2013-10-22 ENCOUNTER — Telehealth: Payer: Self-pay | Admitting: Internal Medicine

## 2013-10-22 DIAGNOSIS — F329 Major depressive disorder, single episode, unspecified: Secondary | ICD-10-CM | POA: Insufficient documentation

## 2013-10-22 DIAGNOSIS — F32A Depression, unspecified: Secondary | ICD-10-CM | POA: Insufficient documentation

## 2013-10-22 NOTE — Progress Notes (Signed)
Patient ID: [name], female   DOB: 08/02/1921, 80 y.o.   MRN: [id number]    Location:  [facility]   Place of Service: OFFICE    Allergies  Allergen Reactions  . Codeine     Chief Complaint  Patient presents with  . weakness    , lightheadedness, gas building up, culminating in diarrhea, per daughters note Ann Dudley. They believe she took too many aspirin on her own. Afraid she may have ulcer from all the aspirin. She also has antacid liquid that she has been taking to settle her stomach along with her Prilosec daily. Daughter wants Dr. Green to emphasize that she should not self medicate.     HPI:  Problem for several weeks with patient complaints of diarrhea, anorexia, stomach pains, and lightheaded feelings.  Diarrhea has not been observed by staff. History of gas building up and then she gets diarrhea.Patient says the problem is so bad that she can't leave the room. Daughters who are with her today say she is refusing to go out to eat with them or leave her room some days. They are concerned she may have organic disease that has not been recognized. They say she took a lot of ASA recently and they have removed it from her apt. She has lost nearly 10# since Fall 2014. Rent CMP and CBC were normal.  She seems anxious and depressed to others, but she denies feeling this way. Was put on higher dose of Cymbalta 5 days ago.  Medications: Patient's Medications  New Prescriptions   No medications on file  Previous Medications   ACETAMINOPHEN (TYLENOL) 325 MG TABLET    Take 650 mg by mouth. Take 2 tablets every 6 hours as needed for pain up to 72 hours   ALUMINUM-MAGNESIUM HYDROXIDE-SIMETHICONE (MAALOX) 200-200-20 MG/5ML SUSP    Take 30 mLs by mouth. Take 15cc every 4 hours as needed for indigestion up to 72 hours   ASPIRIN 81 MG TABLET    Take 1 tablet (81 mg total) by mouth daily.   ATENOLOL (TENORMIN) 50 MG TABLET    Take 50 mg by mouth daily.   CHOLECALCIFEROL (VITAMIN D) 1000  UNITS CAPSULE    Take 1,000 Units by mouth daily.   DILTIAZEM (DILACOR XR) 240 MG 24 HR CAPSULE    Take one tablet by mouth once daily   DULOXETINE (CYMBALTA) 30 MG CAPSULE    Take 1 capsule (30 mg total) by mouth as directed. Give 1 capsule 30 mg daily x7 days then increase to 60 mg daily   MEMANTINE HCL ER (NAMENDA XR) 28 MG CP24    Take 28 mg by mouth daily.   MIRTAZAPINE (REMERON) 7.5 MG TABLET    Take 7.5 mg by mouth. Decrease to 7.5mg at bedtime for 7 days then stop   OMEPRAZOLE (PRILOSEC) 40 MG CAPSULE    Take one capsule once daily for reflux   ZOLPIDEM (AMBIEN) 5 MG TABLET    Take 1 tablet (5 mg total) by mouth at bedtime.  Modified Medications   No medications on file  Discontinued Medications   METOPROLOL SUCCINATE (TOPROL-XL) 50 MG 24 HR TABLET    Take 50 mg by mouth at bedtime.     Review of Systems  Constitutional: Positive for appetite change. Negative for fever, fatigue and unexpected weight change.  HENT: Negative.   Eyes: Negative.   Respiratory: Negative.   Cardiovascular: Negative for chest pain, palpitations and leg swelling.         Hx NSTEMI. Hx cardiomyopathy with LVEF 30%.  Gastrointestinal: Positive for abdominal pain and diarrhea. Negative for nausea, vomiting, constipation and rectal pain.       Anorexia  Genitourinary: Negative.   Musculoskeletal: Positive for arthralgias.  Skin: Negative.   Psychiatric/Behavioral: Positive for sleep disturbance, dysphoric mood and decreased concentration. Negative for suicidal ideas. The patient is nervous/anxious.     Filed Vitals:   10/19/13 1600  BP: 122/72  Pulse: 56  Temp: 97.8 F (36.6 C)  TempSrc: Oral  Weight: 152 lb (68.947 kg)   Body mass index is 25.29 kg/(m^2).  Physical Exam  Constitutional: She is oriented to person, place, and time. She appears well-developed and well-nourished. No distress.  HENT:  Right Ear: External ear normal.  Left Ear: External ear normal.  Nose: Nose normal.    Mouth/Throat: Oropharynx is clear and moist.  Eyes: Conjunctivae and EOM are normal.  Neck: No JVD present. No tracheal deviation present. No thyromegaly present.  Cardiovascular: Exam reveals no gallop and no friction rub.   No murmur heard. AF  Pulmonary/Chest: Effort normal and breath sounds normal. No respiratory distress. She has no wheezes. She has no rales. She exhibits no tenderness.  Abdominal: Soft. Bowel sounds are normal. She exhibits no distension and no mass. There is no tenderness.  Musculoskeletal: She exhibits no edema and no tenderness.  Lymphadenopathy:    She has no cervical adenopathy.  Neurological: She is alert and oriented to person, place, and time. No cranial nerve deficit. Coordination normal.  Memory deficit. Only scored 13/30 on MMSE and failed clock drawing on 11/06/12.  Skin: No rash noted. No erythema. No pallor.  Psychiatric:  Fidgety. Agitated about her problem with insomnia.     Labs reviewed: Nursing Home on 10/08/2013  Component Date Value Ref Range Status  . Hemoglobin 10/06/2013 12.3  12.0 - 16.0 g/dL Final  . HCT 10/06/2013 36  36 - 46 % Final  . Platelets 10/06/2013 318  150 - 399 K/L Final  . WBC 10/06/2013 6.9   Final  . Glucose 10/06/2013 94   Final  . BUN 10/06/2013 22* 4 - 21 mg/dL Final  . Creatinine 10/06/2013 0.9  0.5 - 1.1 mg/dL Final  . Potassium 10/06/2013 4.3  3.4 - 5.3 mmol/L Final  . Sodium 10/06/2013 144  137 - 147 mmol/L Final  . Alkaline Phosphatase 10/06/2013 109  25 - 125 U/L Final  . ALT 10/06/2013 12  7 - 35 U/L Final  . AST 10/06/2013 12* 13 - 35 U/L Final  . Bilirubin, Total 10/06/2013 0.3   Final  Nursing Home on 09/02/2013  Component Date Value Ref Range Status  . WBC 11/17/2012 8.9   Final  . BUN 11/09/2012 13  4 - 21 mg/dL Final  . Potassium 11/09/2012 4.9  3.4 - 5.3 mmol/L Final  Office Visit on 08/04/2013  Component Date Value Ref Range Status  . Sodium 08/04/2013 143  135 - 145 mEq/L Final  . Potassium  08/04/2013 3.8  3.5 - 5.1 mEq/L Final  . Chloride 08/04/2013 107  96 - 112 mEq/L Final  . CO2 08/04/2013 29  19 - 32 mEq/L Final  . Glucose, Bld 08/04/2013 83  70 - 99 mg/dL Final  . BUN 08/04/2013 28* 6 - 23 mg/dL Final  . Creatinine, Ser 08/04/2013 0.9  0.4 - 1.2 mg/dL Final  . Calcium 08/04/2013 9.2  8.4 - 10.5 mg/dL Final  . GFR 08/04/2013 62.55  >60.00 mL/min Final        Assessment/Plan 1. Muscle weakness (generalized) Uncertain etiology. Will get CK,TSH,and ESR  2. Loss of weight About 110# since Fall 2014. She is a light and selective eater. Possible inadequate caloric intake. Worrisome in relation to recent complaints of abd distress.  3. Gastroesophageal reflux disease Mildly symptomatic. Discontinue omeprazole and start ranitidine 150 mg bid.  4. Alzheimer disease Slowly progressive memory loss  5. Cardiomyopathy Known 30% ejection fraction. May be related to malaise, weakness, and anorexia.  6. Hypertension controlled  7. Generalized anxiety disorder Chronic and no worse than usual.  8. Depression Continue duloxetine  9. Change in stools Has loose stools and I recommended Imodium 2 mg with each loose stool. Contact office of Dr. Reynaldo Minium for last colonoscopy results

## 2013-10-22 NOTE — Telephone Encounter (Signed)
Reviewed and agree, I have seen her in the past.

## 2013-10-22 NOTE — Telephone Encounter (Signed)
Spoke with Cerise at [facility]. Patient is c/o severe nausea and diarrhea.The MD at [facility] is requesting GI consult. Cerise reports the patient has been self medicating with multiple meds including ASA. They have found several empty bottles of ASA. They are giving her one tablet daily but she is obtaining other pills (patient has dementia) from unknown sources. Cerise reports patient has not been out of bed or dressed this week. Cerise is aware that patient MUST have a health care power of attorney with her at the visit. Scheduled patient with Mike Gip, PA tomorrow at 2:00 PM.

## 2013-10-23 ENCOUNTER — Encounter: Payer: Self-pay | Admitting: Physician Assistant

## 2013-10-23 ENCOUNTER — Ambulatory Visit (INDEPENDENT_AMBULATORY_CARE_PROVIDER_SITE_OTHER): Payer: Medicare Other | Admitting: Physician Assistant

## 2013-10-23 VITALS — BP 116/60 | HR 60 | Ht 65.0 in | Wt 153.5 lb

## 2013-10-23 DIAGNOSIS — R1084 Generalized abdominal pain: Secondary | ICD-10-CM

## 2013-10-23 DIAGNOSIS — R197 Diarrhea, unspecified: Secondary | ICD-10-CM

## 2013-10-23 DIAGNOSIS — R110 Nausea: Secondary | ICD-10-CM

## 2013-10-23 NOTE — Patient Instructions (Signed)
Please go to the basement level to our lab for stool study test. We sent a prescription to [facility] . 1. Zofran 4 mg for nausea  Take Zantac twice daily. Take a probiotic Restora, you can get this at [facility].   You have been scheduled for a CT scan of the abdomen and pelvis at [facility] ([address]---this is in the same building as [facility]).   You are scheduled on THUrsday 5-21 at 1:00 Pm . You should arrive at 12:45 PM  prior to your appointment time for registration. Please follow the written instructions below on the day of your exam:  WARNING: IF YOU ARE ALLERGIC TO IODINE/X-RAY DYE, PLEASE NOTIFY RADIOLOGY IMMEDIATELY AT [phone]! YOU WILL BE GIVEN A 13 HOUR PREMEDICATION PREP.  1) Do not eat or drink anything after 9:00 am  (4 hours prior to your test) 2) You have been given 2 bottles of oral contrast to drink. The solution may taste  better if refrigerated, but do NOT add ice or any other liquid to this solution. Shake  well before drinking.    Drink 1 bottle of contrast @  11:00 am  (2 hours prior to your exam)  Drink 1 bottle of contrast @  12:00 Noon (1 hour prior to your exam)  You may take any medications as prescribed with a small amount of water except for the following: Metformin, Glucophage, Glucovance, Avandamet, Riomet, Fortamet, Actoplus Met, Janumet, Glumetza or Metaglip. The above medications must be held the day of the exam AND 48 hours after the exam.  The purpose of you drinking the oral contrast is to aid in the visualization of your intestinal tract. The contrast solution may cause some diarrhea. Before your exam is started, you will be given a small amount of fluid to drink. Depending on your individual set of symptoms, you may also receive an intravenous injection of x-ray contrast/dye. Plan on being at  HealthCare for 30 minutes or long, depending on the type of exam you are having performed.  If you  have any questions regarding your exam or if you need to reschedule, you may call the CT department at [phone] between the hours of 8:00 am and 5:00 pm, Monday-Friday.  ________________________________________________________________________    

## 2013-10-23 NOTE — Progress Notes (Signed)
Reviewed/agree

## 2013-10-23 NOTE — Progress Notes (Signed)
Subjective:    Patient ID: [name], female    DOB: 27-Sep-1921, 80 y.o.   MRN: [id number]  HPI  [name] is a 80 year old white female known remotely to Dr. Juanda Chance prior to 2002 is referred today by Dr. are green. Patient is a resident of [facility]. She is referred for complaints of abdominal discomfort nausea and diarrhea. There is a note from one of her caregivers at [facility] in the phone notes which suggest that patient is complaining of severe nausea and diarrhea and that she apparently has been self-medicating with multiple over-the-counter medications and that they have found empty bottles of aspirin in her room though she is only supposed to be taking one tablet per day. Note also states that the patient had not been out of bed for most of this week. She comes in with her daughter today who does not offer any additional history. Patient gives  history herself and tells me that she's not having any significant GI problems she does admit to having nausea and says she's not been vomiting. She feels that her abdominal discomfort is from trapped gas and she is very vague about diarrhea and/or loose stools. When asked specifically about number of  bowel movements she is having she cannot tell me. She does not feel that she seen any blood. She denies any fever or chills. She seems to be annoyed with this visit and is not sure why she's been referred to GI. When asked about weight loss she feels she has lost a little weight recently but again does not feel this is an issue. Patient denies any recent antibiotics. She did just start on Cymbalta but her symptoms predated that. We have received copies of labs done 10/06/2013 and CBC was unremarkable WBC 6.9 hemoglobin 12.3 hematocrit of 36.2 electrolytes within normal limits and liver function studies normal amylase was 41    Review of Systems  Constitutional: Positive for unexpected weight change.  HENT: Negative.   Eyes: Negative.     Respiratory: Negative.   Cardiovascular: Negative.   Gastrointestinal: Positive for nausea, abdominal pain and diarrhea.  Endocrine: Negative.   Genitourinary: Negative.   Musculoskeletal: Negative.   Allergic/Immunologic: Negative.   Neurological: Negative.   Hematological: Negative.   Psychiatric/Behavioral: Negative.    Outpatient Prescriptions Prior to Visit  Medication Sig Dispense Refill  . aspirin 81 MG tablet Take 1 tablet (81 mg total) by mouth daily.      Marland Kitchen atenolol (TENORMIN) 50 MG tablet Take 50 mg by mouth daily.      . Cholecalciferol (VITAMIN D) 1000 UNITS capsule Take 1,000 Units by mouth daily.      Marland Kitchen diltiazem (DILACOR XR) 240 MG 24 hr capsule Take one tablet by mouth once daily  30 capsule  5  . DULoxetine (CYMBALTA) 30 MG capsule Take 1 capsule (30 mg total) by mouth as directed. Give 1 capsule 30 mg daily x7 days then increase to 60 mg daily    3  . Memantine HCl ER (NAMENDA XR) 28 MG CP24 Take 28 mg by mouth daily.  30 capsule  5  . zolpidem (AMBIEN) 5 MG tablet Take 1 tablet (5 mg total) by mouth at bedtime.      Marland Kitchen acetaminophen (TYLENOL) 325 MG tablet Take 650 mg by mouth. Take 2 tablets every 6 hours as needed for pain up to 72 hours      . aluminum-magnesium hydroxide-simethicone (MAALOX) 200-200-20 MG/5ML SUSP Take 30 mLs by mouth.  Take 15cc every 4 hours as needed for indigestion up to 72 hours      . mirtazapine (REMERON) 7.5 MG tablet Take 7.5 mg by mouth. Decrease to 7.5mg  at bedtime for 7 days then stop      . omeprazole (PRILOSEC) 40 MG capsule Take one capsule once daily for reflux  30 capsule  5   No facility-administered medications prior to visit.   Allergies  Allergen Reactions  . Codeine        Patient Active Problem List   Diagnosis Date Noted  . Depression 10/22/2013  . Muscle weakness (generalized) 10/19/2013  . Loss of weight 10/19/2013  . Nausea alone 10/05/2013  . Coronary embolism   . Cardiomyopathy   . Generalized anxiety  disorder 07/15/2013  . Anorexia nervosa 12/22/2012  . Insomnia 11/16/2012  . UTI (urinary tract infection) 11/10/2012  . Alzheimer disease   . Hypertension   . Gastroesophageal reflux disease   . Atrial fibrillation 02/18/2012  . NSTEMI (non-ST elevated myocardial infarction) 02/10/2012   History  Substance Use Topics  . Smoking status: Former Smoker    Types: Cigarettes    Quit date: 11/28/1972  . Smokeless tobacco: Never Used  . Alcohol Use: 1.2 oz/week    2 Glasses of wine per week   family history includes Heart disease in her father.  Objective:   Physical Exam  well-developed elderly white female in no acute distress accompanied by her daughter blood pressure 116/60 pulse 60 height 5 foot 5 weight 153. HEENT; nontraumatic normocephalic EOMI PERRLA sclera anicteric, Supple ;no JVD, Cardiovascular regular rate and rhythm with S1-S2 no murmur or gallop, Pulmonary ;clear bilaterally, Abdomen ;soft she has mild generalized tenderness no focal tenderness no guarding or rebound no palpable mass or hepatosplenomegaly bowel sounds are present, Rectal ;exam not done, Extremities; no clubbing cyanosis or edema skin warm and dry, Psych; mood and affect appropriate she is a bit irritable        Assessment & Plan:  #791  80 year old female with a several week history of nausea and vague abdominal discomfort and diarrhea. Patient is a poor historian, and there is some concern apparently by [facility] that she is taking over-the-counter medicines including aspirin surreptitiously. Etiology of her current symptoms is not clear, rule out infectious etiologies, rule out occult malignancy #2 history of atrial fibrillation #3 hypertension #4 cardiomyopathy EF 30-35% #5 Generalized anxiety disorder #6 Alzheimer's dementia #7 coronary artery disease status post LAD and embolism 2013  Plan; patient will continue ranitidine 150 mg twice daily Add Zofran 4 mg every morning Start a probiotic,  Restora once daily Stool for C. difficile by PCR Schedule for CT scan of the abdomen and pelvis Further plans pending results of above

## 2013-10-26 ENCOUNTER — Telehealth: Payer: Self-pay

## 2013-10-26 NOTE — Telephone Encounter (Signed)
Prior authorization was received for Zofran under Dr.Green's name (PCP). After reviewing patient's chart I seen the rx was provided by GI (Amy Esterwood/PA). I forwarded fax to GI @ [phone]

## 2013-10-27 ENCOUNTER — Other Ambulatory Visit: Payer: Medicare Other

## 2013-10-27 DIAGNOSIS — R110 Nausea: Secondary | ICD-10-CM

## 2013-10-27 DIAGNOSIS — R197 Diarrhea, unspecified: Secondary | ICD-10-CM

## 2013-10-27 DIAGNOSIS — R1084 Generalized abdominal pain: Secondary | ICD-10-CM

## 2013-10-27 NOTE — Telephone Encounter (Signed)
Can you try to get Zofran pre-auth for this pt .Marland KitchenThanks

## 2013-10-27 NOTE — Telephone Encounter (Signed)
I alread filled out a form and stamped your name for the prior authorization.  I faxed it today.  10-27-2013.

## 2013-10-28 ENCOUNTER — Other Ambulatory Visit: Payer: Self-pay | Admitting: Internal Medicine

## 2013-10-28 LAB — CK ISOENZYMES: CK TOTAL: 18 U/L (ref ?–170.0)

## 2013-10-28 LAB — CLOSTRIDIUM DIFFICILE BY PCR: Toxigenic C. Difficile by PCR: NOT DETECTED

## 2013-10-29 ENCOUNTER — Ambulatory Visit (INDEPENDENT_AMBULATORY_CARE_PROVIDER_SITE_OTHER)
Admission: RE | Admit: 2013-10-29 | Discharge: 2013-10-29 | Disposition: A | Discharge status: Home / Self Care | Payer: Medicare Other | Source: Ambulatory Visit | Attending: Physician Assistant | Admitting: Physician Assistant

## 2013-10-29 DIAGNOSIS — R197 Diarrhea, unspecified: Secondary | ICD-10-CM

## 2013-10-29 DIAGNOSIS — R1084 Generalized abdominal pain: Secondary | ICD-10-CM

## 2013-10-29 DIAGNOSIS — R110 Nausea: Secondary | ICD-10-CM

## 2013-10-29 MED ORDER — IOHEXOL 300 MG/ML  SOLN
100.0000 mL | Freq: Once | INTRAMUSCULAR | Status: AC | PRN
  Administered 2013-10-29: 100 mL via INTRAVENOUS

## 2013-11-04 ENCOUNTER — Encounter: Payer: Medicare Other | Admitting: Geriatric Medicine

## 2013-11-06 ENCOUNTER — Encounter: Payer: Self-pay | Admitting: Internal Medicine

## 2013-11-11 ENCOUNTER — Encounter: Payer: Self-pay | Admitting: Internal Medicine

## 2013-11-16 ENCOUNTER — Encounter: Payer: Self-pay | Admitting: Internal Medicine

## 2013-11-16 ENCOUNTER — Non-Acute Institutional Stay: Payer: Medicare Other | Admitting: Internal Medicine

## 2013-11-16 VITALS — BP 124/70 | HR 74 | Wt 156.0 lb

## 2013-11-16 DIAGNOSIS — G4700 Insomnia, unspecified: Secondary | ICD-10-CM

## 2013-11-16 DIAGNOSIS — K219 Gastro-esophageal reflux disease without esophagitis: Secondary | ICD-10-CM

## 2013-11-16 DIAGNOSIS — G309 Alzheimer's disease, unspecified: Principal | ICD-10-CM

## 2013-11-16 DIAGNOSIS — I4891 Unspecified atrial fibrillation: Secondary | ICD-10-CM

## 2013-11-16 DIAGNOSIS — R634 Abnormal weight loss: Secondary | ICD-10-CM

## 2013-11-16 DIAGNOSIS — R110 Nausea: Secondary | ICD-10-CM

## 2013-11-16 DIAGNOSIS — F0280 Dementia in other diseases classified elsewhere without behavioral disturbance: Secondary | ICD-10-CM

## 2013-11-16 DIAGNOSIS — I10 Essential (primary) hypertension: Secondary | ICD-10-CM

## 2013-11-16 NOTE — Progress Notes (Signed)
Patient ID: [name], female   DOB: 12-May-1922, 80 y.o.   MRN: [id number]    Location:  [facility] [employer] of Service: Clinic (12)    Allergies  Allergen Reactions  . Codeine     Chief Complaint  Patient presents with  . Medical Management of Chronic Issues    depression, weakness, weight loss, Alzheimer.  With care giver Osborne Casco. Per Osborne Casco, patient has her to go to the store daily to by 81mg  Aspiring. She takes 6 daily. Osborne Casco doesn't want the patient to know she told us. Patient hides the bottles.    HPI:  She has been insisting that her caretaker get her ASA and she is taking extra amounts of this.  She has new complaints of a lightheaded feeling and weakness.  Alzheimer disease: gradual worsening. She is unable to recall the reason she was here for heer last visit. Does not recall the anorexia or abd distress.  Atrial fibrillation: rate controlled  Hypertension: controlled  Insomnia: persists  Nausea alone: imroved  Gastroesophageal reflux disease: unchanged  Loss of weight: regained 3 pounds    Medications: Patient's Medications  New Prescriptions   No medications on file  Previous Medications   ATENOLOL (TENORMIN) 50 MG TABLET    Take 50 mg by mouth daily.   CHOLECALCIFEROL (VITAMIN D) 1000 UNITS CAPSULE    Take 1,000 Units by mouth daily.   DILTIAZEM (DILACOR XR) 240 MG 24 HR CAPSULE    Take one tablet by mouth once daily   DULOXETINE (CYMBALTA) 30 MG CAPSULE    Take 1 capsule (30 mg total) by mouth as directed. Give 1 capsule 30 mg daily x7 days then increase to 60 mg daily   LOPERAMIDE (IMODIUM A-D) 2 MG TABLET    Take 2 mg by mouth as needed for diarrhea or loose stools.   MEMANTINE HCL ER (NAMENDA XR) 28 MG CP24    Take 28 mg by mouth daily.   ONDANSETRON (ZOFRAN) 4 MG TABLET    Take 4 mg by mouth. Take one tablet daily for nausea   PANTOPRAZOLE (PROTONIX) 40 MG TABLET    Take 40 mg by mouth daily.   PROBIOTIC PRODUCT (RESTORA  PO)    Take 120 mg by mouth. Take one daily   ZOLPIDEM (AMBIEN) 5 MG TABLET    Take 1 tablet (5 mg total) by mouth at bedtime.  Modified Medications   Modified Medication Previous Medication   ASPIRIN 81 MG TABLET aspirin 81 MG tablet      Take 81 mg by mouth. Patient takes 6 daily    Take 1 tablet (81 mg total) by mouth daily.  Discontinued Medications   RANITIDINE (ZANTAC) 150 MG TABLET    Take 150 mg by mouth 2 (two) times daily.     Review of Systems  Constitutional: Positive for appetite change (improved). Negative for fever, fatigue and unexpected weight change.  HENT: Negative.   Eyes: Negative.   Respiratory: Negative.   Cardiovascular: Negative for chest pain, palpitations and leg swelling.       Hx NSTEMI. Hx cardiomyopathy with LVEF 30%.  Gastrointestinal: Positive for abdominal pain and diarrhea. Negative for nausea, vomiting, constipation and rectal pain.       Anorexia has improved  Genitourinary: Negative.   Musculoskeletal: Positive for arthralgias.  Skin: Negative.   Neurological: Positive for weakness.       Memory impaired  Psychiatric/Behavioral: Positive for sleep disturbance, dysphoric mood and decreased concentration.  Negative for suicidal ideas. The patient is nervous/anxious.     Filed Vitals:   11/16/13 1432  BP: 124/70  Pulse: 74  Weight: 156 lb (70.761 kg)   Body mass index is 25.96 kg/(m^2).  Physical Exam  Constitutional: She is oriented to person, place, and time. She appears well-developed and well-nourished. No distress.  HENT:  Right Ear: External ear normal.  Left Ear: External ear normal.  Nose: Nose normal.  Mouth/Throat: Oropharynx is clear and moist.  Eyes: Conjunctivae and EOM are normal.  Neck: No JVD present. No tracheal deviation present. No thyromegaly present.  Cardiovascular: Exam reveals no gallop and no friction rub.   No murmur heard. AF  Pulmonary/Chest: Effort normal and breath sounds normal. No respiratory distress.  She has no wheezes. She has no rales. She exhibits no tenderness.  Abdominal: Soft. Bowel sounds are normal. She exhibits no distension and no mass. There is no tenderness.  Musculoskeletal: She exhibits no edema and no tenderness.  Lymphadenopathy:    She has no cervical adenopathy.  Neurological: She is alert and oriented to person, place, and time. No cranial nerve deficit. Coordination normal.  Memory deficit. Only scored 13/30 on MMSE and failed clock drawing on 11/06/12.  Skin: No rash noted. No erythema. No pallor.  Psychiatric:  Fidgety. Agitated about her problem with insomnia.     Labs reviewed: Lab on 10/28/2013  Component Date Value Ref Range Status  . Sed Rate 10/20/2013 18   Final  . TSH 10/20/2013 2.54  0.41 - 5.90 uIU/mL Final  . Total CK 10/20/2013 18.0  170.0 U/L Final  Appointment on 10/27/2013  Component Date Value Ref Range Status  . C difficile by pcr 10/27/2013 Not Detected  Not Detected Final   Comment: This test is for use only with liquid or soft stools;                          performance characteristics of other clinical specimen                          types have not been established.                                                     This assay was performed by Cepheid GeneXpert(R) PCR.                          The performance characteristics of this assay have                          been determined by [employer]. Performance                          characteristics refer to the analytical performance                          of the test.  Nursing Home on 10/08/2013  Component Date Value Ref Range Status  . Hemoglobin 10/06/2013 12.3  12.0 - 16.0 g/dL Final  . HCT 74/94/4967 36  36 - 46 % Final  . Platelets 10/06/2013 318  150 -  399 K/L Final  . WBC 10/06/2013 6.9   Final  . Glucose 10/06/2013 94   Final  . BUN 10/06/2013 22* 4 - 21 mg/dL Final  . Creatinine 45/40/981104/28/2015 0.9  0.5 - 1.1 mg/dL Final  . Potassium 91/47/829504/28/2015 4.3  3.4 -  5.3 mmol/L Final  . Sodium 10/06/2013 144  137 - 147 mmol/L Final  . Alkaline Phosphatase 10/06/2013 109  25 - 125 U/L Final  . ALT 10/06/2013 12  7 - 35 U/L Final  . AST 10/06/2013 12* 13 - 35 U/L Final  . Bilirubin, Total 10/06/2013 0.3   Final  Nursing Home on 09/02/2013  Component Date Value Ref Range Status  . WBC 11/17/2012 8.9   Final  . BUN 11/09/2012 13  4 - 21 mg/dL Final  . Potassium 62/13/086506/06/2012 4.9  3.4 - 5.3 mmol/L Final      Assessment/Plan Alzheimer disease; progressing  Atrial fibrillation: stable. Should remain on 81 mg ASA daily, but must not exceed this dosse  Hypertension: controlled  Insomnia: unchanged  Nausea alone: improved  Gastroesophageal reflux disease: no symptoms today  Loss of weight: gained 3#

## 2013-11-17 ENCOUNTER — Encounter: Payer: Self-pay | Admitting: Internal Medicine

## 2013-11-17 ENCOUNTER — Ambulatory Visit (INDEPENDENT_AMBULATORY_CARE_PROVIDER_SITE_OTHER): Payer: Medicare Other | Admitting: Internal Medicine

## 2013-11-17 VITALS — BP 134/72 | HR 84 | Ht 65.0 in | Wt 155.2 lb

## 2013-11-17 DIAGNOSIS — K589 Irritable bowel syndrome without diarrhea: Secondary | ICD-10-CM

## 2013-11-17 NOTE — Progress Notes (Signed)
Ezekiel Couey [facility] 10-28-1921 [id number]  Note: This dictation was prepared with Dragon digital system. Any transcriptional errors that result from this procedure are unintentional.   History of Present Illness:  This is a 80 year old, white female resident of [facility]. Her last appointment was with Mike Gip, PA-C  on 10/23/2013 was  for nausea, abdominal pain and diarrhea. A CT scan of the abdomen done on that date showed a 2.7 cm left hepatic lobe complex cyst and concentric thickening of the stomach. There was also diverticulosis. She was started on Zofran, ranitidine and probiotics. Her C. difficile toxin was negative. She has no complaints today. There is no family member with her. She lives in an assisted living situation. Medications are dispensed to her by nursing staff. She eats in the dining room 2 or 3 meals/  day. Her weight has been stable. She reports having one bowel movement daily. She denies any  Fecal urgency or incontinence.. She saw Dr. Chilton Si yesterday.    Past Medical History  Diagnosis Date  . Gastroesophageal reflux disease   . Atrial fibrillation 2013    a. Dx 02/2012, had NSTEMI at that time. Rate control/anticoag.  . Alzheimer's dementia   . Endometrial hyperplasia with atypia 1995  . Personal history of colonic polyps 2000  . Hyperlipidemia   . Hypertension   . Macular degeneration   . Osteoarthritis   . Osteopenia   . NSTEMI (non-ST elevated myocardial infarction) 02/2012    a. Cath: large filling defect apical LAD suggestive of embolus - LM, RCA, LCx OK. Cath complicated by delirium  . Insomnia 11/16/2012  . Coronary embolism     a. 02/2012 with NSTEMI (see above).  . Cardiomyopathy     a. EF 45% by cath 02/2012, 30% by echocardiogram.    Past Surgical History  Procedure Laterality Date  . Appendectomy  1977  . Abdominal hysterectomy  1995    TAH/BSO    Allergies  Allergen Reactions  . Codeine     Family history and social history  have been reviewed.  Review of Systems: Denies dysphagia heartburn abdominal pain  The remainder of the 10 point ROS is negative except as outlined in the H&P  Physical Exam: General Appearance Well developed, in no distress Eyes  Non icteric  HEENT  Non traumatic, normocephalic  Mouth No lesion, tongue papillated, no cheilosis Neck Supple without adenopathy, thyroid not enlarged, no carotid bruits, no JVD Lungs Clear to auscultation bilaterally COR Normal S1, normal S2, regular rhythm, no murmur, quiet precordium Abdomen soft nontender with normoactive bowel sounds no palpable mass Rectal normal rectal sphincter tone. Soft Hemoccult negative stool Extremities  No pedal edema Skin No lesions Neurological Alert and oriented x 3 Psychological Normal mood and affect  Assessment and Plan:   Problem #1 This is a 80 year old white female who was reported to have problems with bowel habits and abdominal pain by the staff and family. in an assisted living facility . None of the family is with her today.and she is denying any symptoms .  She is Hemoccult negative and her abdominal exam is normal. At her age, I would not pursue endoscopic studies. A CT scan of the abdomen showed a left hepatic lobe cyst and concentric thickening of the stomach which may be due to peristalsis. We will see her as needed.    Hart Carwin 11/17/2013

## 2013-11-17 NOTE — Patient Instructions (Signed)
CC: Dr A. Chilton Si

## 2013-12-02 ENCOUNTER — Telehealth: Payer: Self-pay | Admitting: *Deleted

## 2013-12-02 NOTE — Telephone Encounter (Signed)
Received Prior Auth form from [facility] for Zolpidem Tartrate 5mg . Filled out and given to Dr. Chilton Si to sign.

## 2014-01-04 ENCOUNTER — Non-Acute Institutional Stay: Payer: Medicare Other | Admitting: Internal Medicine

## 2014-01-04 ENCOUNTER — Encounter: Payer: Self-pay | Admitting: Internal Medicine

## 2014-01-04 VITALS — BP 124/64 | HR 76 | Wt 157.0 lb

## 2014-01-04 DIAGNOSIS — G4700 Insomnia, unspecified: Secondary | ICD-10-CM

## 2014-01-04 DIAGNOSIS — I10 Essential (primary) hypertension: Secondary | ICD-10-CM

## 2014-01-04 DIAGNOSIS — F0280 Dementia in other diseases classified elsewhere without behavioral disturbance: Secondary | ICD-10-CM

## 2014-01-04 DIAGNOSIS — R110 Nausea: Secondary | ICD-10-CM

## 2014-01-04 DIAGNOSIS — I4891 Unspecified atrial fibrillation: Secondary | ICD-10-CM

## 2014-01-04 DIAGNOSIS — I429 Cardiomyopathy, unspecified: Secondary | ICD-10-CM

## 2014-01-04 DIAGNOSIS — G309 Alzheimer's disease, unspecified: Secondary | ICD-10-CM

## 2014-01-04 DIAGNOSIS — K219 Gastro-esophageal reflux disease without esophagitis: Secondary | ICD-10-CM

## 2014-01-04 DIAGNOSIS — I428 Other cardiomyopathies: Secondary | ICD-10-CM

## 2014-01-04 DIAGNOSIS — I482 Chronic atrial fibrillation, unspecified: Secondary | ICD-10-CM

## 2014-01-04 DIAGNOSIS — R634 Abnormal weight loss: Secondary | ICD-10-CM

## 2014-01-04 NOTE — Progress Notes (Signed)
Patient ID: [name], female   DOB: 08/11/1921, 80 y.o.   MRN: [id number]    Location[facility]  [facility]   Place of Service: Clinic (12)    Allergies  Allergen Reactions  . Codeine     Chief Complaint  Patient presents with  . Medical Management of Chronic Issues    Alzheimer, A-Fib    HPI:  Insomnia: stable to slightly improved. Still benefits from Zolpidem  Chronic atrial fibrillation: rate controlled. NO angina  Alzheimer disease: uunchanged  Gastroesophageal reflux disease without esophagitis: occasional symptomatic reflux.  Loss of weight: stable fro more than one year  Nausea alone: improved  Cardiomyopathy: unchanged . Compensated  Essential hypertension: controlled    Medications: Patient's Medications  New Prescriptions   No medications on file  Previous Medications   ASPIRIN 81 MG TABLET    Take 81 mg by mouth. Patient takes 6 daily   ATENOLOL (TENORMIN) 50 MG TABLET    Take 50 mg by mouth daily.   CHOLECALCIFEROL (VITAMIN D) 1000 UNITS CAPSULE    Take 1,000 Units by mouth daily.   DILTIAZEM (DILACOR XR) 240 MG 24 HR CAPSULE    Take one tablet by mouth once daily   LOPERAMIDE (IMODIUM A-D) 2 MG TABLET    Take 2 mg by mouth as needed for diarrhea or loose stools.   MEMANTINE HCL ER (NAMENDA XR) 28 MG CP24    Take 28 mg by mouth daily.   ONDANSETRON (ZOFRAN) 4 MG TABLET    Take 4 mg by mouth. Take one tablet daily for nausea   PROBIOTIC PRODUCT (RESTORA PO)    Take 120 mg by mouth. Take one daily  Modified Medications   Modified Medication Previous Medication   DULOXETINE (CYMBALTA) 30 MG CAPSULE DULoxetine (CYMBALTA) 30 MG capsule      Take 60 mg by mouth. Take one tablet daily    Take 1 capsule (30 mg total) by mouth as directed. Give 1 capsule 30 mg daily x7 days then increase to 60 mg daily   ZOLPIDEM (AMBIEN) 5 MG TABLET zolpidem (AMBIEN) 5 MG tablet      Take 5 mg by mouth. Take 1/2 tablet at bedtime for sleep    Take 1  tablet (5 mg total) by mouth at bedtime.  Discontinued Medications   PANTOPRAZOLE (PROTONIX) 40 MG TABLET    Take 40 mg by mouth daily.     Review of Systems  Constitutional: Positive for appetite change (improved). Negative for fever, fatigue and unexpected weight change.  HENT: Negative.   Eyes: Negative.   Respiratory: Negative.   Cardiovascular: Negative for chest pain, palpitations and leg swelling.       Hx NSTEMI. Hx cardiomyopathy with LVEF 30%.  Gastrointestinal: Negative for nausea, vomiting, abdominal pain, diarrhea, constipation and rectal pain.       Anorexia has improved  Genitourinary: Negative.   Musculoskeletal: Positive for arthralgias.  Skin: Negative.   Neurological: Positive for weakness.       Memory impaired  Psychiatric/Behavioral: Positive for sleep disturbance, dysphoric mood and decreased concentration. Negative for suicidal ideas. The patient is nervous/anxious.     Filed Vitals:   01/04/14 1354  BP: 124/64  Pulse: 76  Weight: 157 lb (71.215 kg)  SpO2: 95%   Body mass index is 26.13 kg/(m^2).  Physical Exam  Constitutional: She is oriented to person, place, and time. She appears well-developed and well-nourished. No distress.  HENT:  Right Ear: External ear normal.  Left Ear: External ear normal.  Nose: Nose normal.  Mouth/Throat: Oropharynx is clear and moist.  Eyes: Conjunctivae and EOM are normal.  Neck: No JVD present. No tracheal deviation present. No thyromegaly present.  Cardiovascular: Exam reveals no gallop and no friction rub.   No murmur heard. AF  Pulmonary/Chest: Effort normal and breath sounds normal. No respiratory distress. She has no wheezes. She has no rales. She exhibits no tenderness.  Abdominal: Soft. Bowel sounds are normal. She exhibits no distension and no mass. There is no tenderness.  Musculoskeletal: She exhibits no edema and no tenderness.  Lymphadenopathy:    She has no cervical adenopathy.  Neurological: She  is alert and oriented to person, place, and time. No cranial nerve deficit. Coordination normal.  Memory deficit. Only scored 13/30 on MMSE and failed clock drawing on 11/06/12.  Skin: No rash noted. No erythema. No pallor.  Psychiatric:  Fidgety. Agitated about her problem with insomnia.     Labs reviewed: Lab on 10/28/2013  Component Date Value Ref Range Status  . Sed Rate 10/20/2013 18   Final  . TSH 10/20/2013 2.54  0.41 - 5.90 uIU/mL Final  . Total CK 10/20/2013 18.0  170.0 U/L Final  Appointment on 10/27/2013  Component Date Value Ref Range Status  . C difficile by pcr 10/27/2013 Not Detected  Not Detected Final   Comment: This test is for use only with liquid or soft stools;                          performance characteristics of other clinical specimen                          types have not been established.                                                     This assay was performed by Cepheid GeneXpert(R) PCR.                          The performance characteristics of this assay have                          been determined by [employer]. Performance                          characteristics refer to the analytical performance                          of the test.  Nursing Home on 10/08/2013  Component Date Value Ref Range Status  . Hemoglobin 10/06/2013 12.3  12.0 - 16.0 g/dL Final  . HCT 96/09/5407 36  36 - 46 % Final  . Platelets 10/06/2013 318  150 - 399 K/L Final  . WBC 10/06/2013 6.9   Final  . Glucose 10/06/2013 94   Final  . BUN 10/06/2013 22* 4 - 21 mg/dL Final  . Creatinine 81/19/1478 0.9  0.5 - 1.1 mg/dL Final  . Potassium 29/56/2130 4.3  3.4 - 5.3 mmol/L Final  . Sodium 10/06/2013 144  137 - 147 mmol/L Final  . Alkaline Phosphatase 10/06/2013  109  25 - 125 U/L Final  . ALT 10/06/2013 12  7 - 35 U/L Final  . AST 10/06/2013 12* 13 - 35 U/L Final  . Bilirubin, Total 10/06/2013 0.3   Final      Assessment/Plan  1. Insomnia Stable and may be  a little improved  2. Chronic atrial fibrillation Rate controlled  3. Alzheimer disease unchanged  4. Gastroesophageal reflux disease without esophagitis rare symptoms  5. Loss of weight Stable without further change in weight  6. Nausea alone improved  7. Cardiomyopathy Unchanged; compensated  8. Essential hypertension controlled

## 2014-03-15 ENCOUNTER — Encounter: Payer: Medicare Other | Admitting: Internal Medicine

## 2014-03-19 ENCOUNTER — Non-Acute Institutional Stay: Payer: Medicare Other | Admitting: Internal Medicine

## 2014-03-19 DIAGNOSIS — R634 Abnormal weight loss: Secondary | ICD-10-CM

## 2014-03-19 DIAGNOSIS — F0280 Dementia in other diseases classified elsewhere without behavioral disturbance: Secondary | ICD-10-CM

## 2014-03-19 DIAGNOSIS — G4700 Insomnia, unspecified: Secondary | ICD-10-CM

## 2014-03-19 DIAGNOSIS — I10 Essential (primary) hypertension: Secondary | ICD-10-CM

## 2014-03-19 DIAGNOSIS — G309 Alzheimer's disease, unspecified: Secondary | ICD-10-CM

## 2014-03-21 ENCOUNTER — Encounter: Payer: Self-pay | Admitting: Internal Medicine

## 2014-03-21 MED ORDER — MEMANTINE HCL-DONEPEZIL HCL ER 28-10 MG PO CP24: 1.0000 | ORAL_CAPSULE | Freq: Every day | ORAL | Status: DC

## 2014-03-21 MED ORDER — SUVOREXANT 15 MG PO TABS: 15.0000 mg | ORAL_TABLET | Freq: Every evening | ORAL | Status: DC | PRN

## 2014-03-21 NOTE — Progress Notes (Addendum)
Patient ID: [name], female   DOB: 07/29/1921, 80 y.o.   MRN: [id number]    [id number]  Place of Service: [place]    Allergies  Allergen Reactions  . Codeine     Chief Complaint  Patient presents with  . Medical Management of Chronic Issues    memory, insomnia    HPI:  Alzheimer disease: Memory loss seems recently stable to me. She is functional in her current environment on assisted living. She is remaining social and goes out to eat with her daughters frequently. She is independent in bathroom and bathing. She feeds herself. Family is asking if she could be tried on Namzaric.  Essential hypertension: Controlled  Insomnia: She continues to complain of problems with sleeping Ambien at higher doses was not helpful. She is currently on 2.5 mg Ambien at bed. She is asking if she can go back to hard drugs.  Loss of weight: Stable for the last couple of months    Medications: Patient's Medications  New Prescriptions   No medications on file  Previous Medications   ASPIRIN 81 MG TABLET    Take 81 mg by mouth. Patient takes 6 daily   ATENOLOL (TENORMIN) 50 MG TABLET    Take 50 mg by mouth daily.   CHOLECALCIFEROL (VITAMIN D) 1000 UNITS CAPSULE    Take 1,000 Units by mouth daily.   DILTIAZEM (DILACOR XR) 240 MG 24 HR CAPSULE    Take one tablet by mouth once daily   DULOXETINE (CYMBALTA) 30 MG CAPSULE    Take 60 mg by mouth. Take one tablet daily   LOPERAMIDE (IMODIUM A-D) 2 MG TABLET    Take 2 mg by mouth as needed for diarrhea or loose stools.   MEMANTINE HCL ER (NAMENDA XR) 28 MG CP24    Take 28 mg by mouth daily.   ONDANSETRON (ZOFRAN) 4 MG TABLET    Take 4 mg by mouth. Take one tablet daily for nausea   PROBIOTIC PRODUCT (RESTORA PO)    Take 120 mg by mouth. Take one daily   ZOLPIDEM (AMBIEN) 5 MG TABLET    Take 5 mg by mouth. Take 1/2 tablet at bedtime for sleep  Modified Medications   No medications on file    Discontinued Medications   No medications on file     Review of Systems  Constitutional: Positive for appetite change (improved). Negative for fever, fatigue and unexpected weight change.  HENT: Negative.   Eyes: Negative.   Respiratory: Negative.   Cardiovascular: Negative for chest pain, palpitations and leg swelling.       Hx NSTEMI. Hx cardiomyopathy with LVEF 30%.  Gastrointestinal: Negative for nausea, vomiting, abdominal pain, diarrhea, constipation and rectal pain.       Anorexia has improved  Genitourinary: Negative.   Musculoskeletal: Positive for arthralgias.  Skin: Negative.   Neurological: Positive for weakness.       Memory impaired  Psychiatric/Behavioral: Positive for sleep disturbance, dysphoric mood and decreased concentration. Negative for suicidal ideas. The patient is nervous/anxious.     Filed Vitals:   03/21/14 1232  BP: 115/82  Pulse: 90  Temp: 98.1 F (36.7 C)  Resp: 16  Height: 5' 5.8" (1.671 m)  Weight: 135 lb 3.2 oz (61.326 kg)  SpO2: 95%   Body mass index is 21.96 kg/(m^2).  Physical Exam  Constitutional: She is oriented to person, place, and time. She appears well-developed and well-nourished. No distress.  HENT:  Right Ear: External ear normal.  Left Ear: External ear normal.  Nose: Nose normal.  Mouth/Throat: Oropharynx is clear and moist.  Eyes: Conjunctivae and EOM are normal.  Neck: No JVD present. No tracheal deviation present. No thyromegaly present.  Cardiovascular: Exam reveals no gallop and no friction rub.   No murmur heard. AF  Pulmonary/Chest: Effort normal and breath sounds normal. No respiratory distress. She has no wheezes. She has no rales. She exhibits no tenderness.  Abdominal: Soft. Bowel sounds are normal. She exhibits no distension and no mass. There is no tenderness.  Musculoskeletal: She exhibits no edema and no tenderness.  Lymphadenopathy:    She has no cervical adenopathy.  Neurological: She is alert and  oriented to person, place, and time. No cranial nerve deficit. Abnormal muscle tone: diagm. Coordination normal.  Memory deficit. Only scored 13/30 on MMSE and failed clock drawing on 11/06/12.  Skin: No rash noted. No erythema. No pallor.  Psychiatric:  Fidgety. Agitated about her problem with insomnia.     Labs reviewed: No visits with results within 3 Month(s) from this visit. Latest known visit with results is:  Lab on 10/28/2013  Component Date Value Ref Range Status  . Sed Rate 10/20/2013 18   Final  . TSH 10/20/2013 2.54  0.41 - 5.90 uIU/mL Final  . Total CK 10/20/2013 18.0  170.0 U/L Final     Assessment/Plan 1. Alzheimer disease -Discontinue Namenda XR- -start Namzaric 28/10 daily  2. Essential hypertension Continue current medications  3. Insomnia Discontinue Ambien Start Belsomra 10 mg each bedtime  4. Loss of weight Continue to monitor  .

## 2014-03-25 ENCOUNTER — Telehealth: Payer: Self-pay

## 2014-03-25 NOTE — Telephone Encounter (Signed)
[name], the Care [name] is concern about [name]. Her urine is dark, patient just laying in bed for the past four days, just not herself. Told her to tell the nurses there in [place], have them to evaluate her, she might need a urine test done. But they need to evaluate and contact us. [name] was there at [facility] Tuesday and Wednesday, had the nurses known she could had seen her. Always let nurses know. She will talk with the nurses.

## 2014-05-03 ENCOUNTER — Encounter: Payer: Self-pay | Admitting: Internal Medicine

## 2014-05-03 ENCOUNTER — Non-Acute Institutional Stay: Payer: Medicare Other | Admitting: Internal Medicine

## 2014-05-03 VITALS — BP 124/74 | HR 68 | Wt 158.0 lb

## 2014-05-03 DIAGNOSIS — G4700 Insomnia, unspecified: Secondary | ICD-10-CM

## 2014-05-03 DIAGNOSIS — G309 Alzheimer's disease, unspecified: Secondary | ICD-10-CM

## 2014-05-03 DIAGNOSIS — F0280 Dementia in other diseases classified elsewhere without behavioral disturbance: Secondary | ICD-10-CM

## 2014-05-03 DIAGNOSIS — R110 Nausea: Secondary | ICD-10-CM

## 2014-05-03 DIAGNOSIS — R634 Abnormal weight loss: Secondary | ICD-10-CM

## 2014-05-03 DIAGNOSIS — I482 Chronic atrial fibrillation, unspecified: Secondary | ICD-10-CM

## 2014-05-03 DIAGNOSIS — I10 Essential (primary) hypertension: Secondary | ICD-10-CM

## 2014-05-03 MED ORDER — SUVOREXANT 15 MG PO TABS: 15.0000 mg | ORAL_TABLET | Freq: Every evening | ORAL | Status: DC | PRN

## 2014-05-03 NOTE — Progress Notes (Signed)
Patient ID: Kathryn ConstableCarolyn W Schuh, female   DOB: 02/05/1922, 80 y.o.   MRN: [id number]    [facility]  Place of Service: Clinic (12)    Allergies  Allergen Reactions  . Codeine     Chief Complaint  Patient presents with  . Medical Management of Chronic Issues    blood pressure, Alzheimer's, insomnia    HPI:  Alzheimer disease: stable  Essential hypertension: controlled  Insomnia: still having problems with rest at night  Loss of weight: regained 3#  Chronic atrial fibrillation: rate controlled  Nausea without vomiting: improved.    Medications: Patient's Medications  New Prescriptions   No medications on file  Previous Medications   ASPIRIN 81 MG TABLET    Take 81 mg by mouth. Patient takes 6 daily   ATENOLOL (TENORMIN) 50 MG TABLET    Take 50 mg by mouth daily.   CHOLECALCIFEROL (VITAMIN D) 1000 UNITS CAPSULE    Take 1,000 Units by mouth daily.   DILTIAZEM (DILACOR XR) 240 MG 24 HR CAPSULE    Take one tablet by mouth once daily   DULOXETINE (CYMBALTA) 30 MG CAPSULE    Take 60 mg by mouth. Take one tablet daily   LOPERAMIDE (IMODIUM A-D) 2 MG TABLET    Take 2 mg by mouth as needed for diarrhea or loose stools.   MEMANTINE HCL-DONEPEZIL HCL (NAMZARIC) 28-10 MG CP24    Take 1 capsule by mouth at bedtime. to help preserve memory   ONDANSETRON (ZOFRAN) 4 MG TABLET    Take 4 mg by mouth. Take one tablet daily for nausea   PROBIOTIC PRODUCT (RESTORA PO)    Take by mouth. Take one tablet daily   SUVOREXANT (BELSOMRA) 15 MG TABS    Take 15 mg by mouth at bedtime as needed.  Modified Medications   No medications on file  Discontinued Medications   PROBIOTIC PRODUCT (RESTORA PO)    Take 120 mg by mouth. Take one daily     Review of Systems  Constitutional: Positive for appetite change (improved). Negative for fever, fatigue and unexpected weight change.  HENT: Negative.   Eyes: Negative.   Respiratory: Negative.     Cardiovascular: Negative for chest pain, palpitations and leg swelling.       Hx NSTEMI. Hx cardiomyopathy with LVEF 30%.  Gastrointestinal: Negative for nausea, vomiting, abdominal pain, diarrhea, constipation and rectal pain.       Anorexia has improved  Genitourinary: Negative.   Musculoskeletal: Positive for arthralgias.  Skin: Negative.   Neurological: Positive for weakness.       Memory impaired  Psychiatric/Behavioral: Positive for sleep disturbance, dysphoric mood and decreased concentration. Negative for suicidal ideas. The patient is nervous/anxious.     Filed Vitals:   05/03/14 1352  BP: 124/74  Pulse: 68  Weight: 158 lb (71.668 kg)   Body mass index is 25.67 kg/(m^2).  Physical Exam  Constitutional: She is oriented to person, place, and time. She appears well-developed and well-nourished. No distress.  HENT:  Right Ear: External ear normal.  Left Ear: External ear normal.  Nose: Nose normal.  Mouth/Throat: Oropharynx is clear and moist.  Eyes: Conjunctivae and EOM are normal.  Neck: No JVD present. No tracheal deviation present. No thyromegaly present.  Cardiovascular: Exam reveals no gallop and no friction rub.   No murmur heard. AF  Pulmonary/Chest: Effort normal and breath sounds normal. No respiratory distress. She has no wheezes. She has no rales.  She exhibits no tenderness.  Abdominal: Soft. Bowel sounds are normal. She exhibits no distension and no mass. There is no tenderness.  Musculoskeletal: She exhibits no edema or tenderness.  Lymphadenopathy:    She has no cervical adenopathy.  Neurological: She is alert and oriented to person, place, and time. No cranial nerve deficit. Abnormal muscle tone: diagm. Coordination normal.  Memory deficit. Only scored 13/30 on MMSE and failed clock drawing on 11/06/12.  Skin: No rash noted. No erythema. No pallor.  Psychiatric:  Fidgety. Agitated about her problem with insomnia.     Labs reviewed: No visits with  results within 3 Month(s) from this visit. Latest known visit with results is:  Lab on 10/28/2013  Component Date Value Ref Range Status  . Sed Rate 10/20/2013 18   Final  . TSH 10/20/2013 2.54  0.41 - 5.90 uIU/mL Final  . Total CK 10/20/2013 18.0  170.0 U/L Final     Assessment/Plan  1. Alzheimer disease stable  2. Essential hypertension controlled  3. Insomnia Increase Belsomra to 15 mg hs  4. Loss of weight resolved  5. Chronic atrial fibrillation Rate controlled  6. Nausea without vomiting resolved

## 2014-05-20 ENCOUNTER — Encounter (HOSPITAL_COMMUNITY): Payer: Self-pay | Admitting: Cardiovascular Disease

## 2014-07-06 ENCOUNTER — Encounter (HOSPITAL_COMMUNITY): Payer: Self-pay | Admitting: *Deleted

## 2014-07-06 ENCOUNTER — Emergency Department (HOSPITAL_COMMUNITY)
Admission: EM | Admit: 2014-07-06 | Discharge: 2014-07-06 | Disposition: A | Discharge status: Home / Self Care | Payer: Medicare Other | Source: Home / Self Care | Attending: Emergency Medicine | Admitting: Emergency Medicine

## 2014-07-06 ENCOUNTER — Emergency Department (HOSPITAL_COMMUNITY): Payer: Medicare Other

## 2014-07-06 DIAGNOSIS — G309 Alzheimer's disease, unspecified: Secondary | ICD-10-CM

## 2014-07-06 DIAGNOSIS — G4700 Insomnia, unspecified: Secondary | ICD-10-CM | POA: Insufficient documentation

## 2014-07-06 DIAGNOSIS — Z7982 Long term (current) use of aspirin: Secondary | ICD-10-CM | POA: Insufficient documentation

## 2014-07-06 DIAGNOSIS — I10 Essential (primary) hypertension: Secondary | ICD-10-CM | POA: Insufficient documentation

## 2014-07-06 DIAGNOSIS — M8589 Other specified disorders of bone density and structure, multiple sites: Secondary | ICD-10-CM

## 2014-07-06 DIAGNOSIS — Y9289 Other specified places as the place of occurrence of the external cause: Secondary | ICD-10-CM | POA: Insufficient documentation

## 2014-07-06 DIAGNOSIS — I252 Old myocardial infarction: Secondary | ICD-10-CM | POA: Insufficient documentation

## 2014-07-06 DIAGNOSIS — Z8719 Personal history of other diseases of the digestive system: Secondary | ICD-10-CM

## 2014-07-06 DIAGNOSIS — Y9389 Activity, other specified: Secondary | ICD-10-CM

## 2014-07-06 DIAGNOSIS — E785 Hyperlipidemia, unspecified: Secondary | ICD-10-CM | POA: Insufficient documentation

## 2014-07-06 DIAGNOSIS — W000XXA Fall on same level due to ice and snow, initial encounter: Secondary | ICD-10-CM

## 2014-07-06 DIAGNOSIS — Y998 Other external cause status: Secondary | ICD-10-CM

## 2014-07-06 DIAGNOSIS — I4891 Unspecified atrial fibrillation: Secondary | ICD-10-CM

## 2014-07-06 DIAGNOSIS — F0280 Dementia in other diseases classified elsewhere without behavioral disturbance: Secondary | ICD-10-CM

## 2014-07-06 DIAGNOSIS — Z9889 Other specified postprocedural states: Secondary | ICD-10-CM

## 2014-07-06 DIAGNOSIS — Z87891 Personal history of nicotine dependence: Secondary | ICD-10-CM

## 2014-07-06 DIAGNOSIS — Z79899 Other long term (current) drug therapy: Secondary | ICD-10-CM | POA: Insufficient documentation

## 2014-07-06 DIAGNOSIS — M1990 Unspecified osteoarthritis, unspecified site: Secondary | ICD-10-CM

## 2014-07-06 DIAGNOSIS — Z86010 Personal history of colonic polyps: Secondary | ICD-10-CM | POA: Insufficient documentation

## 2014-07-06 DIAGNOSIS — S42202A Unspecified fracture of upper end of left humerus, initial encounter for closed fracture: Secondary | ICD-10-CM

## 2014-07-06 DIAGNOSIS — S42292A Other displaced fracture of upper end of left humerus, initial encounter for closed fracture: Secondary | ICD-10-CM | POA: Insufficient documentation

## 2014-07-06 DIAGNOSIS — Z86718 Personal history of other venous thrombosis and embolism: Secondary | ICD-10-CM

## 2014-07-06 DIAGNOSIS — R4182 Altered mental status, unspecified: Secondary | ICD-10-CM | POA: Diagnosis not present

## 2014-07-06 DIAGNOSIS — J189 Pneumonia, unspecified organism: Secondary | ICD-10-CM | POA: Diagnosis not present

## 2014-07-06 MED ORDER — TRAMADOL HCL 50 MG PO TABS
50.0000 mg | ORAL_TABLET | Freq: Once | ORAL | Status: AC
  Administered 2014-07-06: 50 mg via ORAL
  Filled 2014-07-06: qty 1

## 2014-07-06 MED ORDER — HYDROCODONE-ACETAMINOPHEN 5-325 MG PO TABS: 1.0000 | ORAL_TABLET | Freq: Four times a day (QID) | ORAL | Status: DC | PRN

## 2014-07-06 NOTE — ED Provider Notes (Signed)
CSN: [id number]     Arrival date & time 07/06/14  5621 History   First MD Initiated Contact with Patient 07/06/14 0710     Chief Complaint  Patient presents with  . Fall  . Arm Pain     (Consider location/radiation/quality/duration/timing/severity/associated sxs/prior Treatment) Patient is a 81 y.o. female presenting with fall and arm pain. The history is provided by the patient.  Fall Pertinent negatives include no chest pain, no abdominal pain, no headaches and no shortness of breath.  Arm Pain Pertinent negatives include no chest pain, no abdominal pain, no headaches and no shortness of breath.  pt s/p slip and fall on ice yesterday. Denies loc. No faintness or dizziness prior to fall. C/o left shoulder pain, constant, dull, moderate, non radiating. Worse w movement shoulder/arm. Skin intact. Denies head injury or headache. No loc. No neck or back pain. Right hand dominant. Denies any extremity numbness/weakness. Has been ambulatory since fall. Denies anti-coag use.      Past Medical History  Diagnosis Date  . Gastroesophageal reflux disease   . Atrial fibrillation 2013    a. Dx 02/2012, had NSTEMI at that time. Rate control/anticoag.  . Alzheimer's dementia   . Endometrial hyperplasia with atypia 1995  . Personal history of colonic polyps 2000  . Hyperlipidemia   . Hypertension   . Macular degeneration   . Osteoarthritis   . Osteopenia   . NSTEMI (non-ST elevated myocardial infarction) 02/2012    a. Cath: large filling defect apical LAD suggestive of embolus - LM, RCA, LCx OK. Cath complicated by delirium  . Insomnia 11/16/2012  . Coronary embolism     a. 02/2012 with NSTEMI (see above).  . Cardiomyopathy     a. EF 45% by cath 02/2012, 30% by echocardiogram.   Past Surgical History  Procedure Laterality Date  . Appendectomy  1977  . Abdominal hysterectomy  1995    TAH/BSO  . Left heart catheterization with coronary angiogram N/A 02/19/2012    Procedure: LEFT HEART  CATHETERIZATION WITH CORONARY ANGIOGRAM;  Surgeon: Tonny Bollman, MD;  Location: [facility] CATH LAB;  Service: Cardiovascular;  Laterality: N/A;   Family History  Problem Relation Age of Onset  . Heart disease Father    History  Substance Use Topics  . Smoking status: Former Smoker    Types: Cigarettes    Quit date: 11/28/1972  . Smokeless tobacco: Never Used  . Alcohol Use: 1.2 oz/week    2 Glasses of wine per week   OB History    No data available     Review of Systems  Constitutional: Negative for fever.  HENT: Negative for sore throat.   Eyes: Negative for visual disturbance.  Respiratory: Negative for shortness of breath.   Cardiovascular: Negative for chest pain.  Gastrointestinal: Negative for nausea, vomiting and abdominal pain.  Genitourinary: Negative for flank pain.  Musculoskeletal: Negative for back pain and neck pain.  Skin: Negative for wound.  Neurological: Negative for syncope, weakness, numbness and headaches.  Hematological: Does not bruise/bleed easily.  Psychiatric/Behavioral: Negative for confusion.      Allergies  Codeine  Home Medications   Prior to Admission medications   Medication Sig Start Date End Date Taking? Authorizing Provider  acetaminophen (TYLENOL) 325 MG tablet Take 650 mg by mouth every 6 (six) hours as needed for mild pain, fever or headache.   Yes Historical Provider, MD  aspirin 81 MG tablet Take 81 mg by mouth. Patient takes 6 daily 06/16/13  Yes Claudette  Royston Sinner, NP  atenolol (TENORMIN) 50 MG tablet Take 50 mg by mouth daily.   Yes Historical Provider, MD  Cholecalciferol (VITAMIN D) 1000 UNITS capsule Take 1,000 Units by mouth daily.   Yes Historical Provider, MD  diltiazem (DILACOR XR) 240 MG 24 hr capsule Take one tablet by mouth once daily 08/05/13  Yes Sharon Seller, NP  DULoxetine (CYMBALTA) 60 MG capsule Take 60 mg by mouth daily with breakfast.   Yes Historical Provider, MD  loperamide (IMODIUM A-D) 2 MG tablet Take 2 mg  by mouth as needed for diarrhea or loose stools.   Yes Historical Provider, MD  memantine (NAMENDA XR) 28 MG CP24 24 hr capsule Take 28 mg by mouth daily with breakfast.   Yes Historical Provider, MD  ondansetron (ZOFRAN) 4 MG tablet Take 4 mg by mouth daily with breakfast. Takes scheduled everyday   Yes Historical Provider, MD  Probiotic Product (RESTORA PO) Take 1 tablet by mouth daily with breakfast.    Yes Historical Provider, MD  Memantine HCl-Donepezil HCl (NAMZARIC) 28-10 MG CP24 Take 1 capsule by mouth at bedtime. to help preserve memory Patient not taking: Reported on 07/06/2014 03/21/14   Kimber Relic, MD  Suvorexant (BELSOMRA) 15 MG TABS Take 15 mg by mouth at bedtime as needed. 05/03/14   Kimber Relic, MD   BP 157/71 mmHg  Pulse 81  Temp(Src) 97.7 F (36.5 C) (Oral)  Resp 18  SpO2 91% Physical Exam  Constitutional: She is oriented to person, place, and time. She appears well-developed and well-nourished. No distress.  HENT:  Head: Atraumatic.  Eyes: Conjunctivae are normal. Pupils are equal, round, and reactive to light. No scleral icterus.  Neck: Normal range of motion. Neck supple. No tracheal deviation present.  Cardiovascular: Normal rate, normal heart sounds and intact distal pulses.   Pulmonary/Chest: Effort normal and breath sounds normal. No respiratory distress. She exhibits no tenderness.  Abdominal: Soft. Normal appearance. She exhibits no distension. There is no tenderness.  Musculoskeletal:  Tenderness proximal left upper arm and shoulder. Skin intact. Apart from left shoulder pain, good rom bil ext without other pain or focal bony tenderness. Distal pulses palp.   CTLS spine, non tender, aligned, no step off.   Neurological: She is alert and oriented to person, place, and time.  Motor intact bil, stre 5/5. sens grossly intact.  RUE, med/uln/rad nerve fxn, motor and sens, intact.   Skin: Skin is warm and dry. No rash noted. She is not diaphoretic.   Psychiatric: She has a normal mood and affect.  Nursing note and vitals reviewed.   ED Course  Procedures (including critical care time) Labs Review Labs Reviewed - No data to display  Imaging Review Dg Elbow Complete Left  07/06/2014   CLINICAL DATA:  Status post fall yesterday now left arm and left shoulder pain  EXAM: LEFT ELBOW - COMPLETE 3+ VIEW  COMPARISON:  Left forearm of July 06, 2014  FINDINGS: There is a cortical screw placed through the proximal and shaft of the ulna which is intact. The radial head is absent. The distal humerus is intact. No acute fracture of the elbow is demonstrated. There is diffuse osteopenia. There is no definite joint effusion.  IMPRESSION: There are chronic postsurgical changes but there is no acute bony abnormality of the left elbow.   Electronically Signed   By: David  [place]   On: 07/06/2014 07:41   Dg Forearm Left  07/06/2014   CLINICAL DATA:  Status  post fall on ice yesterday with persistent left shoulder and arm pain  EXAM: LEFT FOREARM - 2 VIEW  COMPARISON:  Left elbow of today's date  FINDINGS: The bones are diffusely osteopenic. There is a cortical screw placed through the proximal and midportions of the shaft of the the left ulna presumably for stabilization of a previous fracture. The radial head is likely surgically absent. No acute fracture of the left radius or ulna is demonstrated. The observed portions of the left wrist are unremarkable. The soft tissues are unremarkable.  IMPRESSION: There is no acute bony abnormality of the left radius or ulna.   Electronically Signed   By: David  [place]   On: 07/06/2014 07:44   Dg Shoulder Left  07/06/2014   CLINICAL DATA:  Status post fall. Colitis yesterday now with left arm and shoulder pain  EXAM: LEFT SHOULDER - 2+ VIEW  COMPARISON:  None.  FINDINGS: The two views submitted are limited in positioning. There is an acute displaced fracture through the neck of the humerus. The observed portions of the  scapula are intact. The [facility] joint exhibits degenerative change but no acute fracture. The observed portions of the left clavicle and upper left ribs are intact.  IMPRESSION: There is an acute displaced fracture through the neck of the left humerus. As best as can be determined the humeral head remains related to the bony glenoid.   Electronically Signed   By: David  [place]   On: 07/06/2014 07:43      MDM  Xrays.  Reviewed nursing notes and prior charts for additional history.   Ultram po.  Ice pack.  Shoulder immobilizer/sling.  Discussed plan ortho f/u.  Pt requests d/c to home, states feels ready to go, and denies other injury or c/o.    Suzi Roots, MD 07/06/14 [phone]

## 2014-07-06 NOTE — ED Notes (Signed)
Per [facility] - pt from [facility] - pt fell yesterday evening, slipped on ice - pt w/ left shoulder and left FA pain, pt declined medical transport/exam at that time. Pt continued to experience progressively worse arm pain, facility performed xray approx midnight last night which showed a left humeral fracture, pt continued to decline medical transport until this morning. Pt received fentanyl IVP en route by [facility], extremity in sling at present. CMS intact distally to injury.

## 2014-07-06 NOTE — ED Notes (Signed)
Bed: RESB Expected date: 07/06/14 Expected time: 6:37 AM Means of arrival: Ambulance Comments: Fall, humerous fracture

## 2014-07-06 NOTE — Discharge Instructions (Signed)
It was our pleasure to provide your ER care today - we hope that you feel better.  Wear shoulder sling/immobilizer.  Ice/cold pack to sore area.  You may try sleeping propped upright to help with swelling and pain.   Take motrin or aleve as need for pain.  You may also take hydrocodone as need for pain. No driving when taking hydrocodone. Also, do not take tylenol or acetaminophen containing medication when taking hydrocodone.  Follow up with orthopedist in 1 week - see referral - call office today to arrange appointment.   Return to ER if worse, new symptoms, worsening or intractable pain, numbness/weakness, other concern.       Shoulder Fracture (Proximal Humerus or Glenoid) A shoulder fracture is a broken upper arm bone or a broken socket bone. The humerus is the upper arm bone and the glenoid is the shoulder socket. Proximal means the humerus is broken near the shoulder. Most of the time the bones of a broken shoulder are in an acceptable position. Usually, the injury can be treated with a shoulder immobilizer or sling and swath bandage. These devices support the arm and prevent any shoulder movement. If the bones are not in a good position, then surgery is sometimes needed. Shoulder fractures usually initially cause swelling, pain, and discoloration around the upper arm. They heal in 8 to 12 weeks with proper treatment. SYMPTOMS  At the time of injury:  Pain.  Tenderness.  Regular body contours are not normal. Later symptoms may include:  Swelling and bruising of the elbow and hand.  Swelling and bruising of the arm or chest. Other symptoms include:  Pain when lifting or turning the arm.  Paralysis below the fracture.  Numbness or coldness below the fracture. CAUSES   Indirect force from falling on an outstretched arm.  A blow to the shoulder. RISK INCREASES WITH:  Not being in shape.  Playing contact sports, such as football, soccer, hockey, or rugby.  Sports  where falling on an outstretched arm occurs, such as basketball, skateboarding, or volleyball.  History of bone or joint disease.  History of shoulder injury. PREVENTION  Warm up before activity.  Stretch before activity.  Stay in shape with your:  Heart fitness.  Flexibility.  Shoulder Strength.  Falling with the proper technique. PROGNOSIS  In adults, healing time is about 7 weeks. For children, healing time is about 5 weeks. Surgery may be needed. RELATED COMPLICATIONS  The bones do not heal together (nonunion).  The bones do not align properly when they heal (malunion).  Long-term problems with pain, stiffness, swelling, or loss of motion.  The injured arm heals shorter than the other.  Nerves are injured in the arm.  Arthritis in the shoulder.  Normal bone growth is interrupted in children.  Blood supply to the shoulder joint is diminished. TREATMENT If the bones are aligned, then initial treatment will be with ice and medicine to help with pain. The shoulder will be held in place with a sling (immobilization). The shoulder will be allowed to heal for up to 6 weeks. Injuries that may need surgery include:  Severe fractures.  Fractures that are not in appropriate alignment (displaced).  Non-displaced fractures (not common). Surgery helps the bones align correctly. The bones may be held in place with:  Sutures.  Wires.  Rods.  Plates.  Screws.  Pins. If you have had surgery or not, you will likely be assisted by a physical therapist or athletic trainer to get the best  results with your injured shoulder. This will likely include exercises to strengthen and stretch the injured and surrounding areas. MEDICATION  If pain medicine is needed, nonsteroidal anti-inflammatory medicines (such as aspirin or ibuprofen) or other minor pain relievers (such as acetaminophen) are often advised.  Do not take pain medicine for 7 days before surgery.  Stronger pain  relievers may be prescribed. Use only as directed and take only as much as you need. COLD THERAPY Cold treatment (icing) relieves pain and reduces inflammation. Cold treatment should be applied for 10 to 15 minutes every 2 to 3 hours, and immediately after activity that aggravates your symptoms. Use ice packs or an ice massage. SEEK IMMEDIATE MEDICAL CARE IF:  You have severe shoulder pain unrelieved by rest and taking pain medicine.  You have pain, numbness, tingling, or weakness in the hand or wrist.  You have shortness of breath, chest pain, severe weakness, or fainting.  You have severe pain with motion of the fingers or wrist.  Blue, gray, or dark color appears in the fingernails on injured extremity. Document Released: 05/28/2005 Document Revised: 08/20/2011 Document Reviewed: 09/09/2008 [facility] Patient Information 2015 [place], [place]. This information is not intended to replace advice given to you by your health care provider. Make sure you discuss any questions you have with your health care provider.     Humerus Fracture, Treated with Immobilization The humerus is the large bone in your upper arm. You have a broken (fractured) humerus. These fractures are easily diagnosed with X-rays. TREATMENT  Simple fractures which will heal without disability are treated with simple immobilization. Immobilization means you will wear a cast, splint, or sling. You have a fracture which will do well with immobilization. The fracture will heal well simply by being held in a good position until it is stable enough to begin range of motion exercises. Do not take part in activities which would further injure your arm.  HOME CARE INSTRUCTIONS   Put ice on the injured area.  Put ice in a plastic bag.  Place a towel between your skin and the bag.  Leave the ice on for 15-20 minutes, 03-04 times a day.  If you have a cast:  Do not scratch the skin under the cast using sharp or pointed  objects.  Check the skin around the cast every day. You may put lotion on any red or sore areas.  Keep your cast dry and clean.  If you have a splint:  Wear the splint as directed.  Keep your splint dry and clean.  You may loosen the elastic around the splint if your fingers become numb, tingle, or turn cold or blue.  If you have a sling:  Wear the sling as directed.  Do not put pressure on any part of your cast or splint until it is fully hardened.  Your cast or splint can be protected during bathing with a plastic bag. Do not lower the cast or splint into water.  Only take over-the-counter or prescription medicines for pain, discomfort, or fever as directed by your caregiver.  Do range of motion exercises as instructed by your caregiver.  Follow up as directed by your caregiver. This is very important in order to avoid permanent injury or disability and chronic pain. SEEK IMMEDIATE MEDICAL CARE IF:   Your skin or nails in the injured arm turn blue or gray.  Your arm feels cold or numb.  You develop severe pain in the injured arm.  You are having  problems with the medicines you were given. MAKE SURE YOU:   Understand these instructions.  Will watch your condition.  Will get help right away if you are not doing well or get worse. Document Released: 09/03/2000 Document Revised: 08/20/2011 Document Reviewed: 07/12/2010 [facility] Patient Information 2015 [place], [place]. This information is not intended to replace advice given to you by your health care provider. Make sure you discuss any questions you have with your health care provider.     Shoulder Immobilizer Your doctor has given you a shoulder immobilizer. This can be used to treat shoulder fractures and dislocations. It keeps the arm supported next to the body, and prevents it from swinging loose and from further injury or pain. Shoulder fractures and dislocations usually take 4-6 weeks to heal. HOME CARE  INSTRUCTIONS  To reduce irritation in your armpit, use powder or pads to absorb any sweat.  Your immobilizer may be removed and washed as directed, but do not use your arm for any work out of the immobilizer unless your doctor approves.  Always wear your immobilizer at night.  Call your doctor if you have any questions about your injury or how to use this device. Document Released: 07/05/2004 Document Revised: 08/20/2011 Document Reviewed: 05/15/2007 [facility] Patient Information 2015 [place], [place]. This information is not intended to replace advice given to you by your health care provider. Make sure you discuss any questions you have with your health care provider.     Cryotherapy Cryotherapy means treatment with cold. Ice or gel packs can be used to reduce both pain and swelling. Ice is the most helpful within the first 24 to 48 hours after an injury or flare-up from overusing a muscle or joint. Sprains, strains, spasms, burning pain, shooting pain, and aches can all be eased with ice. Ice can also be used when recovering from surgery. Ice is effective, has very few side effects, and is safe for most people to use. PRECAUTIONS  Ice is not a safe treatment option for people with:  Raynaud phenomenon. This is a condition affecting small blood vessels in the extremities. Exposure to cold may cause your problems to return.  Cold hypersensitivity. There are many forms of cold hypersensitivity, including:  Cold urticaria. Red, itchy hives appear on the skin when the tissues begin to warm after being iced.  Cold erythema. This is a red, itchy rash caused by exposure to cold.  Cold hemoglobinuria. Red blood cells break down when the tissues begin to warm after being iced. The hemoglobin that carry oxygen are passed into the urine because they cannot combine with blood proteins fast enough.  Numbness or altered sensitivity in the area being iced. If you have any of the following conditions,  do not use ice until you have discussed cryotherapy with your caregiver:  Heart conditions, such as arrhythmia, angina, or chronic heart disease.  High blood pressure.  Healing wounds or open skin in the area being iced.  Current infections.  Rheumatoid arthritis.  Poor circulation.  Diabetes. Ice slows the blood flow in the region it is applied. This is beneficial when trying to stop inflamed tissues from spreading irritating chemicals to surrounding tissues. However, if you expose your skin to cold temperatures for too long or without the proper protection, you can damage your skin or nerves. Watch for signs of skin damage due to cold. HOME CARE INSTRUCTIONS Follow these tips to use ice and cold packs safely.  Place a dry or damp towel between the  ice and skin. A damp towel will cool the skin more quickly, so you may need to shorten the time that the ice is used.  For a more rapid response, add gentle compression to the ice.  Ice for no more than 10 to 20 minutes at a time. The bonier the area you are icing, the less time it will take to get the benefits of ice.  Check your skin after 5 minutes to make sure there are no signs of a poor response to cold or skin damage.  Rest 20 minutes or more between uses.  Once your skin is numb, you can end your treatment. You can test numbness by very lightly touching your skin. The touch should be so light that you do not see the skin dimple from the pressure of your fingertip. When using ice, most people will feel these normal sensations in this order: cold, burning, aching, and numbness.  Do not use ice on someone who cannot communicate their responses to pain, such as small children or people with dementia. HOW TO MAKE AN ICE PACK Ice packs are the most common way to use ice therapy. Other methods include ice massage, ice baths, and cryosprays. Muscle creams that cause a cold, tingly feeling do not offer the same benefits that ice offers and  should not be used as a substitute unless recommended by your caregiver. To make an ice pack, do one of the following:  Place crushed ice or a bag of frozen vegetables in a sealable plastic bag. Squeeze out the excess air. Place this bag inside another plastic bag. Slide the bag into a pillowcase or place a damp towel between your skin and the bag.  Mix 3 parts water with 1 part rubbing alcohol. Freeze the mixture in a sealable plastic bag. When you remove the mixture from the freezer, it will be slushy. Squeeze out the excess air. Place this bag inside another plastic bag. Slide the bag into a pillowcase or place a damp towel between your skin and the bag. SEEK MEDICAL CARE IF:  You develop white spots on your skin. This may give the skin a blotchy (mottled) appearance.  Your skin turns blue or pale.  Your skin becomes waxy or hard.  Your swelling gets worse. MAKE SURE YOU:   Understand these instructions.  Will watch your condition.  Will get help right away if you are not doing well or get worse. Document Released: 01/22/2011 Document Revised: 10/12/2013 Document Reviewed: 01/22/2011 [facility] Patient Information 2015 [place], [place]. This information is not intended to replace advice given to you by your health care provider. Make sure you discuss any questions you have with your health care provider.    Fall Prevention and Home Safety Falls cause injuries and can affect all age groups. It is possible to use preventive measures to significantly decrease the likelihood of falls. There are many simple measures which can make your home safer and prevent falls. OUTDOORS  Repair cracks and edges of walkways and driveways.  Remove high doorway thresholds.  Trim shrubbery on the main path into your home.  Have good outside lighting.  Clear walkways of tools, rocks, debris, and clutter.  Check that handrails are not broken and are securely fastened. Both sides of steps should have  handrails.  Have leaves, snow, and ice cleared regularly.  Use sand or salt on walkways during winter months.  In the garage, clean up grease or oil spills. BATHROOM  Install night lights.  Install grab bars by the toilet and in the tub and shower.  Use non-skid mats or decals in the tub or shower.  Place a plastic non-slip stool in the shower to sit on, if needed.  Keep floors dry and clean up all water on the floor immediately.  Remove soap buildup in the tub or shower on a regular basis.  Secure bath mats with non-slip, double-sided rug tape.  Remove throw rugs and tripping hazards from the floors. BEDROOMS  Install night lights.  Make sure a bedside light is easy to reach.  Do not use oversized bedding.  Keep a telephone by your bedside.  Have a firm chair with side arms to use for getting dressed.  Remove throw rugs and tripping hazards from the floor. KITCHEN  Keep handles on pots and pans turned toward the center of the stove. Use back burners when possible.  Clean up spills quickly and allow time for drying.  Avoid walking on wet floors.  Avoid hot utensils and knives.  Position shelves so they are not too high or low.  Place commonly used objects within easy reach.  If necessary, use a sturdy step stool with a grab bar when reaching.  Keep electrical cables out of the way.  Do not use floor polish or wax that makes floors slippery. If you must use wax, use non-skid floor wax.  Remove throw rugs and tripping hazards from the floor. STAIRWAYS  Never leave objects on stairs.  Place handrails on both sides of stairways and use them. Fix any loose handrails. Make sure handrails on both sides of the stairways are as long as the stairs.  Check carpeting to make sure it is firmly attached along stairs. Make repairs to worn or loose carpet promptly.  Avoid placing throw rugs at the top or bottom of stairways, or properly secure the rug with carpet  tape to prevent slippage. Get rid of throw rugs, if possible.  Have an electrician put in a light switch at the top and bottom of the stairs. OTHER FALL PREVENTION TIPS  Wear low-heel or rubber-soled shoes that are supportive and fit well. Wear closed toe shoes.  When using a stepladder, make sure it is fully opened and both spreaders are firmly locked. Do not climb a closed stepladder.  Add color or contrast paint or tape to grab bars and handrails in your home. Place contrasting color strips on first and last steps.  Learn and use mobility aids as needed. Install an electrical emergency response system.  Turn on lights to avoid dark areas. Replace light bulbs that burn out immediately. Get light switches that glow.  Arrange furniture to create clear pathways. Keep furniture in the same place.  Firmly attach carpet with non-skid or double-sided tape.  Eliminate uneven floor surfaces.  Select a carpet pattern that does not visually hide the edge of steps.  Be aware of all pets. OTHER HOME SAFETY TIPS  Set the water temperature for 120 F (48.8 C).  Keep emergency numbers on or near the telephone.  Keep smoke detectors on every level of the home and near sleeping areas. Document Released: 05/18/2002 Document Revised: 11/27/2011 Document Reviewed: 08/17/2011 [facility] Patient Information 2015 [place], [place]. This information is not intended to replace advice given to you by your health care provider. Make sure you discuss any questions you have with your health care provider.

## 2014-07-07 ENCOUNTER — Encounter: Payer: Self-pay | Admitting: Internal Medicine

## 2014-07-07 ENCOUNTER — Non-Acute Institutional Stay: Payer: Medicare Other | Admitting: Internal Medicine

## 2014-07-07 DIAGNOSIS — T50905A Adverse effect of unspecified drugs, medicaments and biological substances, initial encounter: Secondary | ICD-10-CM

## 2014-07-07 DIAGNOSIS — R410 Disorientation, unspecified: Secondary | ICD-10-CM

## 2014-07-07 DIAGNOSIS — S42213A Unspecified displaced fracture of surgical neck of unspecified humerus, initial encounter for closed fracture: Secondary | ICD-10-CM | POA: Insufficient documentation

## 2014-07-07 DIAGNOSIS — K5909 Other constipation: Secondary | ICD-10-CM

## 2014-07-07 DIAGNOSIS — G309 Alzheimer's disease, unspecified: Secondary | ICD-10-CM

## 2014-07-07 DIAGNOSIS — F0280 Dementia in other diseases classified elsewhere without behavioral disturbance: Secondary | ICD-10-CM

## 2014-07-07 DIAGNOSIS — M810 Age-related osteoporosis without current pathological fracture: Secondary | ICD-10-CM

## 2014-07-07 DIAGNOSIS — S42212D Unspecified displaced fracture of surgical neck of left humerus, subsequent encounter for fracture with routine healing: Secondary | ICD-10-CM

## 2014-07-07 DIAGNOSIS — K5903 Drug induced constipation: Secondary | ICD-10-CM | POA: Insufficient documentation

## 2014-07-07 NOTE — Progress Notes (Signed)
Patient ID: [name], female   DOB: 1921-07-24, 81 y.o.   MRN: [id number]  Provider:  Rexene Edison. Mariea Clonts, D.O., C.M.D. Location:  [facility]  PCP: Nycere Presley, DO  Code Status:   Allergies  Allergen Reactions  . Codeine Other (See Comments)    Unknown reaction Per [facility]     Chief Complaint  Patient presents with  . Acute Visit    readmission to ALF s/p hospitalization with left humerus fracture    HPI: 81 y.o. female with Alzheimer's dementia, afib, gerd, macular degeneration, previously osteopenia (now osteoporosis due to humeral fx), hyperlipidemia, insomnia, coronary embolism in context of NSTEMI in 2013, and cardiomyopathy was seen upon readmission to ALF after an ED visit with left humerus deformity.  This occurred after she fell when leaving dinner.  Report from the bartender indicated she'd had 3 scotch and waters and nursing noted she was intoxicated at the time of her fall on the ice outside.  Xrays revealed acute displaced fx through the neck of the left humerus.  She also had evidence of an old cortical screw through the proximal and shaft of the ulna.  She had her fall the night of 1/25, but would not go to the hospital until 1/26.  She was given IV fentanyl 176mg en route by ems.  She was then put on ultram po and placed in a shoulder immobilizer/sling.  She returned here to ALF.  She has a f/u appt with Dr. HMarcelino Scot(orthopedics) 2/31/6.  Nursing indicates she has not had a BM overnight or this am.    ROS: Review of Systems  Constitutional: Negative for fever and chills.  HENT: Negative for congestion.   Respiratory: Negative for shortness of breath.   Cardiovascular: Negative for chest pain and leg swelling.  Gastrointestinal: Positive for constipation. Negative for abdominal pain, blood in stool and melena.  Genitourinary: Negative for dysuria.  Musculoskeletal: Positive for falls.       Arm pain  Skin:       ecchymoses  Neurological: Negative for  dizziness.  Psychiatric/Behavioral: Positive for memory loss.     Past Medical History  Diagnosis Date  . Gastroesophageal reflux disease   . Atrial fibrillation 2013    a. Dx 02/2012, had NSTEMI at that time. Rate control/anticoag.  . Alzheimer's dementia   . Endometrial hyperplasia with atypia 1995  . Personal history of colonic polyps 2000  . Hyperlipidemia   . Hypertension   . Macular degeneration   . Osteoarthritis   . Osteopenia   . NSTEMI (non-ST elevated myocardial infarction) 02/2012    a. Cath: large filling defect apical LAD suggestive of embolus - LM, RCA, LCx OK. Cath complicated by delirium  . Insomnia 11/16/2012  . Coronary embolism     a. 02/2012 with NSTEMI (see above).  . Cardiomyopathy     a. EF 45% by cath 02/2012, 30% by echocardiogram.   Past Surgical History  Procedure Laterality Date  . Appendectomy  1977  . Abdominal hysterectomy  1995    TAH/BSO  . Left heart catheterization with coronary angiogram N/A 02/19/2012    Procedure: LEFT HEART CATHETERIZATION WITH CORONARY ANGIOGRAM;  Surgeon: MSherren Mocha MD;  [facility];  Service: Cardiovascular;  Laterality: N/A;   Social History:   reports that she quit smoking about 41 years ago. Her smoking use included Cigarettes. She has never used smokeless tobacco. She reports that she drinks about 1.2 oz of alcohol per week. She  reports that she does not use illicit drugs.  Family History  Problem Relation Age of Onset  . Heart disease Father     Medications: Patient's Medications  New Prescriptions   No medications on file  Previous Medications   ACETAMINOPHEN (TYLENOL) 325 MG TABLET    Take 650 mg by mouth every 6 (six) hours as needed for mild pain, fever or headache.   ASPIRIN 81 MG TABLET    Take 81 mg by mouth. Patient takes 6 daily   ATENOLOL (TENORMIN) 50 MG TABLET    Take 50 mg by mouth daily with breakfast.    CHOLECALCIFEROL (VITAMIN D) 1000 UNITS CAPSULE    Take 1,000 Units by mouth  daily.   DILTIAZEM (DILACOR XR) 240 MG 24 HR CAPSULE    Take one tablet by mouth once daily   DULOXETINE (CYMBALTA) 60 MG CAPSULE    Take 60 mg by mouth daily with breakfast.   HYDROCODONE-ACETAMINOPHEN (NORCO/VICODIN) 5-325 MG PER TABLET    Take 1 tablet by mouth every 6 (six) hours as needed for moderate pain or severe pain.   LOPERAMIDE (IMODIUM A-D) 2 MG TABLET    Take 2 mg by mouth as needed for diarrhea or loose stools.   MEMANTINE (NAMENDA XR) 28 MG CP24 24 HR CAPSULE    Take 28 mg by mouth daily with breakfast.   ONDANSETRON (ZOFRAN) 4 MG TABLET    Take 4 mg by mouth daily with breakfast. Takes scheduled everyday   PROBIOTIC PRODUCT (RESTORA PO)    Take 1 tablet by mouth daily with breakfast.    SUVOREXANT (BELSOMRA) 15 MG TABS    Take 15 mg by mouth at bedtime as needed.  Modified Medications   No medications on file  Discontinued Medications   MEMANTINE HCL-DONEPEZIL HCL (NAMZARIC) 28-10 MG CP24    Take 1 capsule by mouth at bedtime. to help preserve memory   Physical Exam: Filed Vitals:   07/07/14 1643  BP: 114/73  Pulse: 76  Temp: 97.8 F (36.6 C)  Resp: 18  Height: 5' 8" (1.727 m)  Weight: 160 lb 9.6 oz (72.848 kg)  SpO2: 94%   Physical Exam  Constitutional: She appears well-developed and well-nourished. No distress.  Cardiovascular: Normal rate, regular rhythm, normal heart sounds and intact distal pulses.   Pulmonary/Chest: Effort normal and breath sounds normal. No respiratory distress.  Abdominal: Soft. Bowel sounds are normal. She exhibits no distension. There is no tenderness.  Sitting with her pants pulled down over her abdomen  Neurological: She is alert.  Extremely confused, repeating herself many times throughout the visit, was convinced I was the first person she met here and that we knew each other and this was my first visit with her today; was offering to share her lunch, was unable to think of words to explain something about the political debates going on    Skin: Skin is warm and dry.  Ecchymoses over left upper arm anteriorly and down to elbow, some posteriorly, as well and some posteriorly on right arm and shoulder region  Psychiatric: She has a normal mood and affect.     Labs reviewed: Basic Metabolic Panel:  Recent Labs  08/04/13 1143 10/06/13  NA 143 144  K 3.8 4.3  CL 107  --   CO2 29  --   GLUCOSE 83  --   BUN 28* 22*  CREATININE 0.9 0.9  CALCIUM 9.2  --    Liver Function Tests:  Recent Labs  10/06/13  AST 12*  ALT 12  ALKPHOS 109   No results for input(s): LIPASE, AMYLASE in the last 8760 hours. No results for input(s): AMMONIA in the last 8760 hours. CBC:  Recent Labs  10/06/13  WBC 6.9  HGB 12.3  HCT 36  PLT 318   Cardiac Enzymes:  Recent Labs  10/20/13  CKTOTAL 18.0   BNP: Invalid input(s): POCBNP CBG: No results for input(s): GLUCAP in the last 8760 hours.  Imaging and Procedures: Left shoulder xray 07/06/14:  acute displaced fracture through the neck of the left humerus. As best as can be determined the humeral head remains related to the bony glenoid Left forearm xray 07/06/14:  No acute abnormality of radius or ulna Left elbow xray 07/06/14:  chronic postsurgical changes but there is no acute bony abnormality of the left elbow.  Assessment/Plan 1. Humeral surgical neck fracture, left, with routine healing, subsequent encounter -cont use of sling -keep f/u appt for next wed, 07/14/14 with Dr. Marcelino Scot -will stop hydrocodone due to severe confusion -will give tramadol 37m po q 6 hrs a single tablet scheduled and an additional tablet prn for pain 2. Acute delirium -multifactorial -had alcohol in her system at time of fall, then given high dose of fentanyl, went to ED and back so environment changed, then taking hydrocodone since for pain  -she is confused and drowsy in the daytime -will d/c hydrocodone and go to tramadol as above -if confusion persists in 2 days, would obtain labs, but seems  to be due to pain and medication primarily at this point 3. Alzheimer disease -continues on namenda XR--could not take the namzaric due to anorexia problem -does have this at baseline--did not note what MMSE was when last checked during visit 4. Constipation due to pain medication -add miralax if no bm in 2 day period (does not have bowel regimen and apparently normally has difficulty with loose stools--has prn imodium) 5.  Senile osteoporosis:  Recommend increasing vitamin D to 2000 units daily from 1000 units daily  Functional status: normally is independent except to receive assistance with medications;  Currently requiring 24 hr supervision due to her confusion and risk of recurrent fall  Family/ staff Communication: her daughter, Lyn, arrived during the visit and we also spoke on the phone later in the day to discuss above plans  Labs/tests ordered:  None at present

## 2014-07-08 ENCOUNTER — Inpatient Hospital Stay (HOSPITAL_COMMUNITY)
Admission: EM | Admit: 2014-07-08 | Discharge: 2014-07-12 | DRG: 193 | Disposition: A | Discharge status: Skilled Nursing Facility | Payer: Medicare Other | Attending: Internal Medicine | Admitting: Internal Medicine

## 2014-07-08 ENCOUNTER — Emergency Department (HOSPITAL_COMMUNITY): Payer: Medicare Other

## 2014-07-08 ENCOUNTER — Encounter (HOSPITAL_COMMUNITY): Payer: Self-pay | Admitting: *Deleted

## 2014-07-08 DIAGNOSIS — R4182 Altered mental status, unspecified: Secondary | ICD-10-CM | POA: Diagnosis present

## 2014-07-08 DIAGNOSIS — G309 Alzheimer's disease, unspecified: Secondary | ICD-10-CM | POA: Diagnosis present

## 2014-07-08 DIAGNOSIS — Z8249 Family history of ischemic heart disease and other diseases of the circulatory system: Secondary | ICD-10-CM

## 2014-07-08 DIAGNOSIS — E785 Hyperlipidemia, unspecified: Secondary | ICD-10-CM | POA: Diagnosis present

## 2014-07-08 DIAGNOSIS — I482 Chronic atrial fibrillation: Secondary | ICD-10-CM | POA: Diagnosis present

## 2014-07-08 DIAGNOSIS — G9341 Metabolic encephalopathy: Secondary | ICD-10-CM | POA: Diagnosis present

## 2014-07-08 DIAGNOSIS — I429 Cardiomyopathy, unspecified: Secondary | ICD-10-CM

## 2014-07-08 DIAGNOSIS — Z87891 Personal history of nicotine dependence: Secondary | ICD-10-CM

## 2014-07-08 DIAGNOSIS — H3530 Unspecified macular degeneration: Secondary | ICD-10-CM | POA: Diagnosis present

## 2014-07-08 DIAGNOSIS — J189 Pneumonia, unspecified organism: Secondary | ICD-10-CM | POA: Diagnosis present

## 2014-07-08 DIAGNOSIS — I2510 Atherosclerotic heart disease of native coronary artery without angina pectoris: Secondary | ICD-10-CM | POA: Diagnosis present

## 2014-07-08 DIAGNOSIS — Z90710 Acquired absence of both cervix and uterus: Secondary | ICD-10-CM

## 2014-07-08 DIAGNOSIS — I10 Essential (primary) hypertension: Secondary | ICD-10-CM | POA: Diagnosis present

## 2014-07-08 DIAGNOSIS — M1990 Unspecified osteoarthritis, unspecified site: Secondary | ICD-10-CM | POA: Diagnosis present

## 2014-07-08 DIAGNOSIS — I252 Old myocardial infarction: Secondary | ICD-10-CM | POA: Diagnosis not present

## 2014-07-08 DIAGNOSIS — Z7982 Long term (current) use of aspirin: Secondary | ICD-10-CM

## 2014-07-08 DIAGNOSIS — Y95 Nosocomial condition: Secondary | ICD-10-CM | POA: Diagnosis present

## 2014-07-08 DIAGNOSIS — N390 Urinary tract infection, site not specified: Secondary | ICD-10-CM | POA: Diagnosis present

## 2014-07-08 DIAGNOSIS — Z23 Encounter for immunization: Secondary | ICD-10-CM

## 2014-07-08 DIAGNOSIS — K219 Gastro-esophageal reflux disease without esophagitis: Secondary | ICD-10-CM | POA: Diagnosis present

## 2014-07-08 DIAGNOSIS — M8580 Other specified disorders of bone density and structure, unspecified site: Secondary | ICD-10-CM | POA: Diagnosis present

## 2014-07-08 DIAGNOSIS — S42212A Unspecified displaced fracture of surgical neck of left humerus, initial encounter for closed fracture: Secondary | ICD-10-CM

## 2014-07-08 DIAGNOSIS — F0280 Dementia in other diseases classified elsewhere without behavioral disturbance: Secondary | ICD-10-CM | POA: Diagnosis present

## 2014-07-08 DIAGNOSIS — G9340 Encephalopathy, unspecified: Secondary | ICD-10-CM

## 2014-07-08 DIAGNOSIS — J9601 Acute respiratory failure with hypoxia: Secondary | ICD-10-CM | POA: Diagnosis present

## 2014-07-08 LAB — CBC WITH DIFFERENTIAL/PLATELET
Basophils Absolute: 0 10*3/uL (ref 0.0–0.1)
Basophils Relative: 0 % (ref 0–1)
EOS ABS: 0.2 10*3/uL (ref 0.0–0.7)
Eosinophils Relative: 1 % (ref 0–5)
HCT: 38.7 % (ref 36.0–46.0)
Hemoglobin: 12.4 g/dL (ref 12.0–15.0)
LYMPHS ABS: 1.6 10*3/uL (ref 0.7–4.0)
Lymphocytes Relative: 13 % (ref 12–46)
MCH: 30 pg (ref 26.0–34.0)
MCHC: 32 g/dL (ref 30.0–36.0)
MCV: 93.5 fL (ref 78.0–100.0)
Monocytes Absolute: 1.2 10*3/uL — ABNORMAL HIGH (ref 0.1–1.0)
Monocytes Relative: 10 % (ref 3–12)
Neutro Abs: 9.1 10*3/uL — ABNORMAL HIGH (ref 1.7–7.7)
Neutrophils Relative %: 76 % (ref 43–77)
Platelets: 204 10*3/uL (ref 150–400)
RBC: 4.14 MIL/uL (ref 3.87–5.11)
RDW: 14 % (ref 11.5–15.5)
WBC: 12.1 10*3/uL — ABNORMAL HIGH (ref 4.0–10.5)

## 2014-07-08 LAB — I-STAT TROPONIN, ED: TROPONIN I, POC: 0.01 ng/mL (ref 0.00–0.08)

## 2014-07-08 MED ORDER — DEXTROSE 5 % IV SOLN
1.0000 g | Freq: Once | INTRAVENOUS | Status: AC
  Administered 2014-07-08: 1 g via INTRAVENOUS
  Filled 2014-07-08: qty 1

## 2014-07-08 MED ORDER — VANCOMYCIN HCL IN DEXTROSE 1-5 GM/200ML-% IV SOLN
1000.0000 mg | Freq: Once | INTRAVENOUS | Status: AC
  Administered 2014-07-08: 1000 mg via INTRAVENOUS
  Filled 2014-07-08: qty 200

## 2014-07-08 NOTE — ED Notes (Signed)
Per EMS pt from [facility], daughter stated she took a nap and woke up around 1800 w/ some confusion, pt was recently placed on tramadol and family is thinking this is why she is confused, pt does have hx of alzheimers.

## 2014-07-08 NOTE — ED Notes (Signed)
Pt. In xray 

## 2014-07-08 NOTE — ED Provider Notes (Signed)
CSN: [id number]     Arrival date & time 07/08/14  2106 History   First MD Initiated Contact with Patient 07/08/14 2112     Chief Complaint  Patient presents with  . Altered Mental Status     (Consider location/radiation/quality/duration/timing/severity/associated sxs/prior Treatment) HPI Comments: Patient with a history of Dementia brought in today by her Primary Care Giver due to altered mental status.  She currently lives at [facility] and is in assisted living.  Her care giver reports that earlier this evening the staff noticed that the patient appeared to be more confused.  Staff also reported that the patient's oxygen level was low.  Her Primary Care Giver then was told to bring patient to the ED.  Her Primary Care Giver reports that when she arrived the patient knew her name and where she was, but did seem slightly more confused than normal.  She states that she has not noticed the patient coughing or a fever.  No nausea, vomiting, or diarrhea.  No facial asymmetry, focal weakness, or slurred speech.  She does have some difficulty swallowing food and occasionally chokes on her food.  Patient denies any pain at this time.  She is incontinent of Urine at baseline, but caregiver reports that she has not noticed an odor to her urine.  Patient was seen in the ED two days ago after a fall and diagnosed with a fracture of her left humerus.  Arm is currently in a sling.  She has been taking Ultram for the pain.  Last dose was earlier today.  Care giver reports that the patient has not fallen since her last ED visit.  Patient is a 81 y.o. female presenting with altered mental status. The history is provided by the patient.  Altered Mental Status   Past Medical History  Diagnosis Date  . Gastroesophageal reflux disease   . Atrial fibrillation 2013    a. Dx 02/2012, had NSTEMI at that time. Rate control/anticoag.  . Alzheimer's dementia   . Endometrial hyperplasia with atypia  1995  . Personal history of colonic polyps 2000  . Hyperlipidemia   . Hypertension   . Macular degeneration   . Osteoarthritis   . Osteopenia   . NSTEMI (non-ST elevated myocardial infarction) 02/2012    a. Cath: large filling defect apical LAD suggestive of embolus - LM, RCA, LCx OK. Cath complicated by delirium  . Insomnia 11/16/2012  . Coronary embolism     a. 02/2012 with NSTEMI (see above).  . Cardiomyopathy     a. EF 45% by cath 02/2012, 30% by echocardiogram.   Past Surgical History  Procedure Laterality Date  . Appendectomy  1977  . Abdominal hysterectomy  1995    TAH/BSO  . Left heart catheterization with coronary angiogram N/A 02/19/2012    Procedure: LEFT HEART CATHETERIZATION WITH CORONARY ANGIOGRAM;  Surgeon: Tonny Bollman, MD;  [facility] C[facility] LAB;  Service: Cardiovascular;  Laterality: N/A;   Family History  Problem Relation Age of Onset  . Heart disease Father    History  Substance Use Topics  . Smoking status: Former Smoker    Types: Cigarettes    Quit date: 11/28/1972  . Smokeless tobacco: Never Used  . Alcohol Use: 1.2 oz/week    2 Glasses of wine per week   OB History    No data available     Review of Systems  All other systems reviewed and are negative.     Allergies  Codeine  Home Medications   Prior to Admission medications   Medication Sig Start Date End Date Taking? Authorizing Provider  acetaminophen (TYLENOL) 325 MG tablet Take 650 mg by mouth every 6 (six) hours as needed for mild pain, fever or headache.    Historical Provider, MD  aspirin 81 MG tablet Take 81 mg by mouth. Patient takes 6 daily 06/16/13   Toribio Harbour, NP  atenolol (TENORMIN) 50 MG tablet Take 50 mg by mouth daily with breakfast.     Historical Provider, MD  Cholecalciferol (VITAMIN D) 1000 UNITS capsule Take 1,000 Units by mouth daily.    Historical Provider, MD  diltiazem (DILACOR XR) 240 MG 24 hr capsule Take one tablet by mouth once daily Patient taking  differently: Take 240 mg by mouth daily with breakfast.  08/05/13   Sharon Seller, NP  DULoxetine (CYMBALTA) 60 MG capsule Take 60 mg by mouth daily with breakfast.    Historical Provider, MD  HYDROcodone-acetaminophen (NORCO/VICODIN) 5-325 MG per tablet Take 1 tablet by mouth every 6 (six) hours as needed for moderate pain or severe pain. 07/06/14   Suzi Roots, MD  loperamide (IMODIUM A-D) 2 MG tablet Take 2 mg by mouth as needed for diarrhea or loose stools.    Historical Provider, MD  memantine (NAMENDA XR) 28 MG CP24 24 hr capsule Take 28 mg by mouth daily with breakfast.    Historical Provider, MD  ondansetron (ZOFRAN) 4 MG tablet Take 4 mg by mouth daily with breakfast. Takes scheduled everyday    Historical Provider, MD  Probiotic Product (RESTORA PO) Take 1 tablet by mouth daily with breakfast.     Historical Provider, MD  Suvorexant (BELSOMRA) 15 MG TABS Take 15 mg by mouth at bedtime as needed. Patient taking differently: Take 15 mg by mouth at bedtime.  05/03/14   Kimber Relic, MD   BP 131/55 mmHg  Pulse 65  Temp(Src) 99 F (37.2 C) (Oral)  Resp 18  SpO2 98% Physical Exam  Constitutional: She appears well-developed and well-nourished. No distress.  HENT:  Head: Normocephalic and atraumatic.  Mouth/Throat: Oropharynx is clear and moist.  Eyes: EOM are normal. Pupils are equal, round, and reactive to light.  Neck: Normal range of motion. Neck supple.  Cardiovascular: Normal rate and normal heart sounds.  An irregularly irregular rhythm present.  Pulmonary/Chest: Effort normal and breath sounds normal. No respiratory distress. She has no wheezes.  Abdominal: Soft. Bowel sounds are normal. She exhibits no distension and no mass. There is no tenderness. There is no rebound and no guarding.  Musculoskeletal:  Left arm in a sling.  Full ROM of extremities aside from the LUE.    Neurological: She is alert. She has normal strength. No cranial nerve deficit.  Muscle strength of  LUE not tested due to humerus fracture  Skin: Skin is warm and dry. She is not diaphoretic.  Psychiatric: She has a normal mood and affect.  Nursing note and vitals reviewed.   ED Course  Procedures (including critical care time) Labs Review Labs Reviewed  CBC WITH DIFFERENTIAL/PLATELET  COMPREHENSIVE METABOLIC PANEL  URINALYSIS, ROUTINE W REFLEX MICROSCOPIC  I-STAT TROPOININ, ED    Imaging Review No results found.   EKG Interpretation   Date/Time:  Thursday July 08 2014 22:16:44 EST Ventricular Rate:  76 PR Interval:    QRS Duration: 88 QT Interval:  424 QTC Calculation: 477 R Axis:   56 Text Interpretation:  Pacemaker spikes or artifacts Atrial fibrillation  Borderline T abnormalities, lateral leads improvement in ST elevation in  inferior leads and resolution of t-wave inversion Confirmed by [facility]   MD, Alphonzo Lemmings ([id number]) on 07/08/2014 10:44:52 PM      MDM   Final diagnoses:  Altered mental status   Patient presents today from [facility] due to AMS.  History obtained through the Primary Care Giver of the patient who reports that the patient appeared confused earlier today and also was found to be hypoxic.  Work up in the ED shows a left upper lobe Pneumonia.  Patient with oxygen level of 88-90 at rest on RA. Patient is not on chronic oxygen.  Patient treated for HAP with Cefipime and Vancomycin. Remainder of labs unremarkable aside from mild leukocytosis.  CT head is negative.  No focal neurological deficits on exam. Confusion and hypoxia most likely caused by Pneumonia.  Patient admitted to [facility] for further management.  Patient also evaluated by Dr. Anitra Lauth who is in agreement with the plan.      Santiago Glad, PA-C 07/11/14 1724  Santiago Glad, PA-C 07/11/14 1726  Gwyneth Sprout, MD 07/11/14 1730

## 2014-07-08 NOTE — ED Notes (Signed)
Bed: BW38 Expected date:  Expected time:  Means of arrival:  Comments: EMS 81 yo with increased confusion

## 2014-07-08 NOTE — ED Notes (Signed)
MD at bedside. 

## 2014-07-08 NOTE — ED Notes (Signed)
EKG given to EDP,Lockwood,MD., for review. 

## 2014-07-08 NOTE — H&P (Signed)
History and Physical  WILLAMAE DELLES HWK:[id number] DOB: 08-Jan-1922 DOA: 07/08/2014   PCP: [name], DO   Chief Complaint: Altered mental status  HPI:  81 year old female with a history of Alzheimer's dementia, chronic atrial fibrillation,dementia, afib, gerd, macular degeneration,hyperlipidemia, insomnia, coronary embolism in context of NSTEMI in 2013, and cardiomyopathy presents with altered mental status of 1-2 day duration. The patient had a mechanical fall on 07/05/2014 resulting in a left humeral neck fracture. The patient was seen in the emergency department and subsequently sent back to [facility] with followup appointment with Dr. Carola Frost on 07/14/14. The patient's left upper extremity was placed in a sling. According to the progress note by Dr. Renato Gails on 07/07/14, there was report from the bartender indicated she'd had 3 scotch and waters and nursing noted she was intoxicated at the time of her fall on the ice outside. Apparently, the patient's confusion worsened on the morning of admission. In addition, the patient was noted to be hypoxemic at her facility prompting transfer to the emergency department. Presently, the patient is unable to provide any history due to her confusion. All of this history is obtained from review of the medical records and speaking with the patient's caretaker at the bedside. At baseline, the patient is able to ambulate and perform all her activities of daily living without assistance. Her caregiver has also noted increasing confusion within the past 1-2 days. This been no reports of headache, chest pain, respiratory distress, vomiting, diarrhea. When the patient was discharged back to her facility on 07/06/2014, the patient was given hydrocodone. In the emergency department, the patient had oxygen saturation of 100% on 2 L. The patient was noted to be hypoxemic with oxygen saturation 88% on room air. She was given vancomycin and cefepime. WBC was 12.1. Chest x-ray  showed right upper lobe infiltrate with small left effusion. EKG showed atrial fibrillation with nonspecific ST changes. Metabolic panel was pending at the time of my evaluation. Assessment/Plan: acute encephalopathy  -multifactorial secondary to infectious process, hypoxemia, opioids -CMP is pending at the time of my evaluation -Check TSH -Continue vancomycin and cefepime -Supplemental oxygen -Judicious opioids when necessary pain -UA and urine culture HCAP/Acute Respiratory Failure -continue vancomycin and cefepime -presently stable on 2 L [place] Chronic atrial fibrillation -Rate controlled -Continue aspirin -TSH -Continue atenolol and diltiazem Hypertension -Continue atenolol and diltiazem Alzheimer's dementia -Continue Namenda Left humeral neck fracture -Remain in sling for now -may need to contact Dr. Carola Frost for consult  -PT eval CAD with hx of NSTEMI -continue ASA and BB -no CP presently      Past Medical History  Diagnosis Date  . Gastroesophageal reflux disease   . Atrial fibrillation 2013    a. Dx 02/2012, had NSTEMI at that time. Rate control/anticoag.  . Alzheimer's dementia   . Endometrial hyperplasia with atypia 1995  . Personal history of colonic polyps 2000  . Hyperlipidemia   . Hypertension   . Macular degeneration   . Osteoarthritis   . Osteopenia   . NSTEMI (non-ST elevated myocardial infarction) 02/2012    a. Cath: large filling defect apical LAD suggestive of embolus - LM, RCA, LCx OK. Cath complicated by delirium  . Insomnia 11/16/2012  . Coronary embolism     a. 02/2012 with NSTEMI (see above).  . Cardiomyopathy     a. EF 45% by cath 02/2012, 30% by echocardiogram.   Past Surgical History  Procedure Laterality Date  . Appendectomy  1977  . Abdominal  hysterectomy  1995    TAH/BSO  . Left heart catheterization with coronary angiogram N/A 02/19/2012    Procedure: LEFT HEART CATHETERIZATION WITH CORONARY ANGIOGRAM;  Surgeon: Tonny Bollman, MD;   Location: [facility] CATH LAB;  Service: Cardiovascular;  Laterality: N/A;   Social History:  reports that she quit smoking about 41 years ago. Her smoking use included Cigarettes. She has never used smokeless tobacco. She reports that she drinks about 1.2 oz of alcohol per week. She reports that she does not use illicit drugs.   Family History  Problem Relation Age of Onset  . Heart disease Father      Allergies  Allergen Reactions  . Codeine Other (See Comments)    Unknown reaction Per MAR       Prior to Admission medications   Medication Sig Start Date End Date Taking? Authorizing Provider  aspirin 81 MG tablet Take 81 mg by mouth daily.  06/16/13  Yes Claudette Royston Sinner, NP  atenolol (TENORMIN) 50 MG tablet Take 50 mg by mouth daily with breakfast.    Yes Historical Provider, MD  Cholecalciferol (VITAMIN D) 1000 UNITS capsule Take 1,000 Units by mouth daily.   Yes Historical Provider, MD  diltiazem (DILACOR XR) 240 MG 24 hr capsule Take one tablet by mouth once daily 08/05/13  Yes Sharon Seller, NP  DULoxetine (CYMBALTA) 60 MG capsule Take 60 mg by mouth daily with breakfast.   Yes Historical Provider, MD  HYDROcodone-acetaminophen (NORCO/VICODIN) 5-325 MG per tablet Take 1 tablet by mouth every 6 (six) hours as needed for moderate pain or severe pain. 07/06/14  Yes Suzi Roots, MD  memantine (NAMENDA XR) 28 MG CP24 24 hr capsule Take 28 mg by mouth daily with breakfast.   Yes Historical Provider, MD  ondansetron (ZOFRAN) 4 MG tablet Take 4 mg by mouth daily with breakfast.    Yes Historical Provider, MD  Probiotic Product (RESTORA PO) Take 1 tablet by mouth daily with breakfast.    Yes Historical Provider, MD  Suvorexant (BELSOMRA) 15 MG TABS Take 15 mg by mouth at bedtime as needed. Patient taking differently: Take 15 mg by mouth at bedtime.  05/03/14  Yes Kimber Relic, MD  acetaminophen (TYLENOL) 325 MG tablet Take 650 mg by mouth every 6 (six) hours as needed for mild pain, fever  or headache.    Historical Provider, MD  loperamide (IMODIUM A-D) 2 MG tablet Take 2 mg by mouth as needed for diarrhea or loose stools.    Historical Provider, MD    Review of Systems:  Constitutional:  No weight loss, night sweats, Fevers, chills Head&Eyes: No headache.  No vision loss.  ENT:  No Difficulty swallowing,Tooth/dental problems,Sore throat,   Cardio-vascular:  No chest pain, Orthopnea, PND, swelling in lower extremities GI:  No  abdominal pain, nausea, vomiting, diarrhea,  hematochezia, melena Resp:  No shortness of breath with exertion or at rest. No cough. No coughing up of blood . Skin:  no rash or lesions.  GU:  no dysuria, change in color of urine, no urgency or frequency. No flank pain.  Musculoskeletal:  No joint pain or swelling. No decreased range of motion. No back pain.  Psych:  No change in mood or affect. Neurologic: No headache, no dysesthesia, no focal weakness, no vision loss. No syncope  Physical Exam: Filed Vitals:   07/08/14 2107 07/08/14 2245  BP: 131/55   Pulse: 65 157  Temp: 99 F (37.2 C)   TempSrc: Oral  Resp: 18 17  SpO2: 98% 98%   General:  A&O x 1, NAD, nontoxic, pleasant/cooperative Head/Eye: No conjunctival hemorrhage, no icterus, Mena/AT, No nystagmus ENT:  No icterus,  No thrush, good dentition, no pharyngeal exudate Neck:  No masses, no lymphadenpathy, no meningismus CV:  IRRR, no rub, no gallop, no S3 Lung:  Bilateral scattered rales, right greater than left. no wheezing Abdomen: soft/NT, +BS, nondistended, no peritoneal signs Ext: No cyanosis, No rashes, No petechiae, No lymphangitis, trace LE edema Neuro: CNII-XII intact,  patient moves all 4 extremities antigravity,no dysmetria; left upper extremity strength testing not performed due to fracture  Labs on Admission:  Basic Metabolic Panel: No results for input(s): NA, K, CL, CO2, GLUCOSE, BUN, CREATININE, CALCIUM, MG, PHOS in the last 168 hours. Liver Function  Tests: No results for input(s): AST, ALT, ALKPHOS, BILITOT, PROT, ALBUMIN in the last 168 hours. No results for input(s): LIPASE, AMYLASE in the last 168 hours. No results for input(s): AMMONIA in the last 168 hours. CBC:  Recent Labs Lab 07/08/14 2144  WBC 12.1*  NEUTROABS 9.1*  HGB 12.4  HCT 38.7  MCV 93.5  PLT 204   Cardiac Enzymes: No results for input(s): CKTOTAL, CKMB, CKMBINDEX, TROPONINI in the last 168 hours. BNP: Invalid input(s): POCBNP CBG: No results for input(s): GLUCAP in the last 168 hours.  Radiological Exams on Admission: Dg Chest 2 View  07/08/2014   CLINICAL DATA:  Confusion.  Atrial fibrillation.  Hypertension.  EXAM: CHEST  2 VIEW  COMPARISON:  02/18/2012  FINDINGS: There is a consolidated right upper lobe infiltrate with mild volume loss. Hilar, mediastinal and cardiac contours are unremarkable and unchanged. There are no effusions.  IMPRESSION: Right upper lobe infiltrate, possibly pneumonia or aspiration. Follow-up is necessary to confirm complete clearing.   Electronically Signed   By: Ellery Plunk M.D.   On: 07/08/2014 22:34   Ct Head Wo Contrast  07/08/2014   CLINICAL DATA:  Acute onset of confusion.  Initial encounter.  EXAM: CT HEAD WITHOUT CONTRAST  TECHNIQUE: Contiguous axial images were obtained from the base of the skull through the vertex without intravenous contrast.  COMPARISON:  None.  FINDINGS: There is no evidence of acute infarction, mass lesion, or intra- or extra-axial hemorrhage on CT.  Prominence of the ventricles and sulci reflects moderate cortical volume loss. Cerebellar atrophy is noted. Diffuse periventricular and subcortical white matter change likely reflects small vessel ischemic microangiopathy.  The brainstem and fourth ventricle are within normal limits. The basal ganglia are unremarkable in appearance. The cerebral hemispheres demonstrate grossly normal gray-white differentiation. No mass effect or midline shift is seen.  There  is no evidence of fracture; visualized osseous structures are unremarkable in appearance. The visualized portions of the orbits are within normal limits. The paranasal sinuses and mastoid air cells are well-aerated. No significant soft tissue abnormalities are seen.  IMPRESSION: 1. No acute intracranial pathology seen on CT. 2. Moderate age-appropriate cortical volume loss and diffuse small vessel ischemic microangiopathy.   Electronically Signed   By: Roanna Raider M.D.   On: 07/08/2014 22:45    EKG: Independently reviewed. Atrial fibrillation, nonspecific ST changes    Time spent:60 minutes Code Status:   FULL Family Communication:   Caregiver at bedside   Sunil Hue, DO  [facility] Pager [phone]  If 7PM-7AM, please contact night-coverage [email or web address] Password [facility] 07/08/2014, 11:43 PM

## 2014-07-09 DIAGNOSIS — S42212S Unspecified displaced fracture of surgical neck of left humerus, sequela: Secondary | ICD-10-CM

## 2014-07-09 DIAGNOSIS — G9340 Encephalopathy, unspecified: Secondary | ICD-10-CM

## 2014-07-09 DIAGNOSIS — S42212A Unspecified displaced fracture of surgical neck of left humerus, initial encounter for closed fracture: Secondary | ICD-10-CM

## 2014-07-09 LAB — URINALYSIS, ROUTINE W REFLEX MICROSCOPIC
Bilirubin Urine: NEGATIVE
Bilirubin Urine: NEGATIVE
GLUCOSE, UA: NEGATIVE mg/dL
GLUCOSE, UA: NEGATIVE mg/dL
Ketones, ur: NEGATIVE mg/dL
Ketones, ur: NEGATIVE mg/dL
NITRITE: POSITIVE — AB
NITRITE: POSITIVE — AB
PROTEIN: NEGATIVE mg/dL
Protein, ur: NEGATIVE mg/dL
SPECIFIC GRAVITY, URINE: 1.028 (ref 1.005–1.030)
Specific Gravity, Urine: 1.03 (ref 1.005–1.030)
UROBILINOGEN UA: 0.2 mg/dL (ref 0.0–1.0)
UROBILINOGEN UA: 0.2 mg/dL (ref 0.0–1.0)
pH: 5 (ref 5.0–8.0)
pH: 5 (ref 5.0–8.0)

## 2014-07-09 LAB — URINE MICROSCOPIC-ADD ON

## 2014-07-09 LAB — BASIC METABOLIC PANEL
ANION GAP: 8 (ref 5–15)
BUN: 26 mg/dL — ABNORMAL HIGH (ref 6–23)
CALCIUM: 8.9 mg/dL (ref 8.4–10.5)
CHLORIDE: 106 mmol/L (ref 96–112)
CO2: 28 mmol/L (ref 19–32)
CREATININE: 0.75 mg/dL (ref 0.50–1.10)
GFR calc Af Amer: 84 mL/min — ABNORMAL LOW (ref >90)
GFR calc non Af Amer: 72 mL/min — ABNORMAL LOW (ref >90)
Glucose, Bld: 120 mg/dL — ABNORMAL HIGH (ref 70–99)
Potassium: 3.9 mmol/L (ref 3.5–5.1)
SODIUM: 142 mmol/L (ref 135–145)

## 2014-07-09 LAB — COMPREHENSIVE METABOLIC PANEL
ALT: 13 U/L (ref 0–35)
ANION GAP: 9 (ref 5–15)
AST: 17 U/L (ref 0–37)
Albumin: 3.6 g/dL (ref 3.5–5.2)
Alkaline Phosphatase: 64 U/L (ref 39–117)
BUN: 28 mg/dL — ABNORMAL HIGH (ref 6–23)
CO2: 21 mmol/L (ref 19–32)
Calcium: 8.9 mg/dL (ref 8.4–10.5)
Chloride: 113 mmol/L — ABNORMAL HIGH (ref 96–112)
Creatinine, Ser: 0.9 mg/dL (ref 0.50–1.10)
GFR calc Af Amer: 63 mL/min — ABNORMAL LOW (ref >90)
GFR calc non Af Amer: 55 mL/min — ABNORMAL LOW (ref >90)
GLUCOSE: 109 mg/dL — AB (ref 70–99)
POTASSIUM: 4.3 mmol/L (ref 3.5–5.1)
Sodium: 143 mmol/L (ref 135–145)
TOTAL PROTEIN: 6.7 g/dL (ref 6.0–8.3)
Total Bilirubin: 0.6 mg/dL (ref 0.3–1.2)

## 2014-07-09 LAB — CBC
HCT: 37.5 % (ref 36.0–46.0)
Hemoglobin: 11.9 g/dL — ABNORMAL LOW (ref 12.0–15.0)
MCH: 29.7 pg (ref 26.0–34.0)
MCHC: 31.7 g/dL (ref 30.0–36.0)
MCV: 93.5 fL (ref 78.0–100.0)
PLATELETS: 249 10*3/uL (ref 150–400)
RBC: 4.01 MIL/uL (ref 3.87–5.11)
RDW: 13.8 % (ref 11.5–15.5)
WBC: 10.6 10*3/uL — AB (ref 4.0–10.5)

## 2014-07-09 LAB — MRSA PCR SCREENING: MRSA BY PCR: POSITIVE — AB

## 2014-07-09 LAB — TSH: TSH: 1.723 u[IU]/mL (ref 0.350–4.500)

## 2014-07-09 LAB — ETHANOL: Alcohol, Ethyl (B): <5 mg/dL (ref 0–9)

## 2014-07-09 MED ORDER — MORPHINE SULFATE 2 MG/ML IJ SOLN: 1.0000 mg | INTRAMUSCULAR | Status: DC | PRN

## 2014-07-09 MED ORDER — MUPIROCIN 2 % EX OINT
1.0000 "application " | TOPICAL_OINTMENT | Freq: Two times a day (BID) | CUTANEOUS | Status: DC
  Administered 2014-07-09 – 2014-07-12 (×6): 1 via NASAL
  Filled 2014-07-09: qty 22

## 2014-07-09 MED ORDER — SODIUM CHLORIDE 0.9 % IJ SOLN
3.0000 mL | Freq: Two times a day (BID) | INTRAMUSCULAR | Status: DC
  Administered 2014-07-09 – 2014-07-11 (×4): 3 mL via INTRAVENOUS

## 2014-07-09 MED ORDER — ASPIRIN 81 MG PO CHEW
81.0000 mg | CHEWABLE_TABLET | Freq: Every day | ORAL | Status: DC
  Administered 2014-07-09 – 2014-07-12 (×4): 81 mg via ORAL
  Filled 2014-07-09 (×4): qty 1

## 2014-07-09 MED ORDER — ACETAMINOPHEN 325 MG PO TABS: 650.0000 mg | ORAL_TABLET | Freq: Four times a day (QID) | ORAL | Status: DC | PRN

## 2014-07-09 MED ORDER — VITAMIN D 1000 UNITS PO TABS
1000.0000 [IU] | ORAL_TABLET | Freq: Every day | ORAL | Status: DC
  Administered 2014-07-09 – 2014-07-12 (×4): 1000 [IU] via ORAL
  Filled 2014-07-09 (×4): qty 1

## 2014-07-09 MED ORDER — MORPHINE SULFATE 2 MG/ML IJ SOLN
0.5000 mg | INTRAMUSCULAR | Status: DC | PRN
  Administered 2014-07-09: 0.5 mg via INTRAVENOUS
  Filled 2014-07-09: qty 1

## 2014-07-09 MED ORDER — ONDANSETRON HCL 4 MG PO TABS: 4.0000 mg | ORAL_TABLET | Freq: Four times a day (QID) | ORAL | Status: DC | PRN

## 2014-07-09 MED ORDER — ENOXAPARIN SODIUM 40 MG/0.4ML ~~LOC~~ SOLN
40.0000 mg | SUBCUTANEOUS | Status: DC
  Administered 2014-07-09: 40 mg via SUBCUTANEOUS
  Filled 2014-07-09: qty 0.4

## 2014-07-09 MED ORDER — CHLORHEXIDINE GLUCONATE CLOTH 2 % EX PADS
6.0000 | MEDICATED_PAD | Freq: Every day | CUTANEOUS | Status: DC
  Administered 2014-07-10 – 2014-07-12 (×3): 6 via TOPICAL

## 2014-07-09 MED ORDER — HYDROCODONE-ACETAMINOPHEN 5-325 MG PO TABS
1.0000 | ORAL_TABLET | Freq: Four times a day (QID) | ORAL | Status: DC | PRN
  Administered 2014-07-09 (×3): 1 via ORAL
  Filled 2014-07-09 (×3): qty 1

## 2014-07-09 MED ORDER — VANCOMYCIN HCL IN DEXTROSE 1-5 GM/200ML-% IV SOLN
1000.0000 mg | INTRAVENOUS | Status: DC
  Administered 2014-07-09 – 2014-07-11 (×3): 1000 mg via INTRAVENOUS
  Filled 2014-07-09 (×3): qty 200

## 2014-07-09 MED ORDER — DULOXETINE HCL 60 MG PO CPEP
60.0000 mg | ORAL_CAPSULE | Freq: Every day | ORAL | Status: DC
  Administered 2014-07-09 – 2014-07-12 (×4): 60 mg via ORAL
  Filled 2014-07-09 (×5): qty 1

## 2014-07-09 MED ORDER — DILTIAZEM HCL ER 240 MG PO CP24
240.0000 mg | ORAL_CAPSULE | Freq: Every day | ORAL | Status: DC
  Administered 2014-07-09 – 2014-07-12 (×4): 240 mg via ORAL
  Filled 2014-07-09 (×4): qty 1

## 2014-07-09 MED ORDER — ONDANSETRON HCL 4 MG/2ML IJ SOLN: 4.0000 mg | Freq: Four times a day (QID) | INTRAMUSCULAR | Status: DC | PRN

## 2014-07-09 MED ORDER — ATENOLOL 50 MG PO TABS
50.0000 mg | ORAL_TABLET | Freq: Every day | ORAL | Status: DC
  Administered 2014-07-09 – 2014-07-12 (×4): 50 mg via ORAL
  Filled 2014-07-09 (×7): qty 1

## 2014-07-09 MED ORDER — MEMANTINE HCL ER 28 MG PO CP24
28.0000 mg | ORAL_CAPSULE | Freq: Every day | ORAL | Status: DC
  Administered 2014-07-09 – 2014-07-12 (×4): 28 mg via ORAL
  Filled 2014-07-09 (×5): qty 1

## 2014-07-09 MED ORDER — VITAMINS A & D EX OINT
TOPICAL_OINTMENT | CUTANEOUS | Status: AC
  Administered 2014-07-09: 02:00:00
  Filled 2014-07-09: qty 5

## 2014-07-09 MED ORDER — CETYLPYRIDINIUM CHLORIDE 0.05 % MT LIQD
7.0000 mL | Freq: Two times a day (BID) | OROMUCOSAL | Status: DC
Start: 2014-07-09 — End: 2014-07-12
  Administered 2014-07-09 – 2014-07-11 (×6): 7 mL via OROMUCOSAL

## 2014-07-09 MED ORDER — CEFEPIME HCL 1 G IJ SOLR
1.0000 g | Freq: Two times a day (BID) | INTRAMUSCULAR | Status: DC
  Administered 2014-07-09 – 2014-07-12 (×7): 1 g via INTRAVENOUS
  Filled 2014-07-09 (×8): qty 1

## 2014-07-09 NOTE — Evaluation (Signed)
Physical Therapy Evaluation Patient Details Name: [name] MRN: [id number] DOB: 1921/08/16 Today's Date: 07/09/2014   History of Present Illness  81 yo female admitted with AMS, Pna, L humerus fx. Hx of dementia, A fib, macular degeneration, NSTEMI. Pt is from ALF  Clinical Impression  On eval, pt required Max assist +2 for bed mobility and Mod assist +2 for ambulation distance of ~15 feet. Pt appeared confused during session. Resistant to instructions/cueing at times. Recommend SNF for continued rehab.     Follow Up Recommendations SNF;Supervision/Assistance - 24 hour    Equipment Recommendations  None recommended by PT    Recommendations for Other Services OT consult     Precautions / Restrictions Precautions Precautions: Shoulder;Fall Shoulder Interventions: Shoulder sling/immobilizer;At all times Restrictions Weight Bearing Restrictions: Yes LUE Weight Bearing: Non weight bearing      Mobility  Bed Mobility Overal bed mobility: Needs Assistance;+2 for physical assistance;+ 2 for safety/equipment Bed Mobility: Supine to Sit;Sit to Supine     Supine to sit: Max assist;+2 for physical assistance;+2 for safety/equipment;HOB elevated Sit to supine: Max assist;+2 for physical assistance;+2 for safety/equipment;HOB elevated   General bed mobility comments: assist for trunk and bil LEs. Pt resistant to therapist's instructions. Multimodal cues for safety, adhrence to precautions, proper sling positioning.   Transfers Overall transfer level: Needs assistance Equipment used: 1 person hand held assist Transfers: Sit to/from Stand Sit to Stand: Mod assist;+2 physical assistance;+2 safety/equipment;From elevated surface         General transfer comment: Assist to rise, stabilize, control descent. Multimodal cues for safety, technique, hand placement  Ambulation/Gait Ambulation/Gait assistance: Mod assist;+2 physical assistance;+2 safety/equipment Ambulation Distance  (Feet): 15 Feet Assistive device: 1 person hand held assist       General Gait Details: Assist to provide stabilization and 1HHA. Pt is unsteady and at risk for falling. Fatigues easily.   Stairs            Wheelchair Mobility    Modified Rankin (Stroke Patients Only)       Balance Overall balance assessment: Needs assistance;History of Falls         Standing balance support: Single extremity supported;During functional activity Standing balance-Leahy Scale: Poor                               Pertinent Vitals/Pain Pain Assessment: Faces Faces Pain Scale: Hurts even more Pain Location: L shoulder when moving Pain Descriptors / Indicators: Sharp Pain Intervention(s): Limited activity within patient's tolerance;Monitored during session;Repositioned    Home Living Family/patient expects to be discharged to:: Assisted living               Home Equipment: Gil[place] Mor - single point      Prior Function Level of Independence: Independent         Comments: uses cane occsionally     Hand Dominance        Extremity/Trunk Assessment   Upper Extremity Assessment: LUE deficits/detail           Lower Extremity Assessment: Generalized weakness      Cervical / Trunk Assessment: Normal  Communication   Communication: No difficulties  Cognition Arousal/Alertness: Awake/alert Behavior During Therapy: WFL for tasks assessed/performed Overall Cognitive Status: No family/caregiver present to determine baseline cognitive functioning (seemed confused at times)       Memory: Decreased recall of precautions  General Comments      Exercises        Assessment/Plan    PT Assessment Patient needs continued PT services  PT Diagnosis Difficulty walking;Generalized weakness;Acute pain;Altered mental status   PT Problem List Decreased strength;Decreased range of motion;Decreased activity tolerance;Decreased balance;Decreased  mobility;Decreased cognition;Pain;Decreased knowledge of use of DME;Decreased safety awareness;Decreased knowledge of precautions  PT Treatment Interventions DME instruction;Gait training;Functional mobility training;Therapeutic activities;Therapeutic exercise;Patient/family education;Balance training   PT Goals (Current goals can be found in the Care Plan section) Acute Rehab PT Goals Patient Stated Goal: none stated PT Goal Formulation: Patient unable to participate in goal setting Time For Goal Achievement: 07/23/14 Potential to Achieve Goals: Good    Frequency Min 3X/week   Barriers to discharge        Co-evaluation               End of Session Equipment Utilized During Treatment: Gait belt Activity Tolerance: Patient limited by pain;Patient limited by fatigue Patient left: in bed;with call bell/phone within reach;with bed alarm set           Time: 1415-1440 PT Time Calculation (min) (ACUTE ONLY): 25 min   Charges:   PT Evaluation $Initial PT Evaluation Tier I: 1 Procedure PT Treatments $Therapeutic Activity: 8-22 mins   PT G Codes:        Rebeca Alert, MPT Pager: [phone]

## 2014-07-09 NOTE — Progress Notes (Signed)
Clinical Social Work Department BRIEF PSYCHOSOCIAL ASSESSMENT 07/09/2014  Patient:  [name], [name]     Account Number:  [id number]     Admit date:  07/08/2014  Clinical Social Worker:  Earlie Server  Date/Time:  07/09/2014 09:45 AM  Referred by:  Physician  Date Referred:  07/09/2014 Referred for  ALF Placement   Other Referral:   Interview type:  Patient Other interview type:    PSYCHOSOCIAL DATA Living Status:  FACILITY Admitted from facility:  [facility] Level of care:  Assisted Living Primary support name:  [name] Primary support relationship to patient:  CHILD, ADULT Degree of support available:   Strong    CURRENT CONCERNS Current Concerns  Post-Acute Placement   Other Concerns:    SOCIAL WORK ASSESSMENT / PLAN CSW received referral due to patient being admitted from a facility. CSW reviewed chart and met with patient and caregiver at bedside. CSW introduced myself and explained role.    Patient reports she has been living at [facility] for 10 years. Patient was in independent living until about 1 year ago when she moved into assisted living. Patient fell a couple of weeks ago and has been in a sling for her arm. Patient confused at times but caregiver reports that dtrs live out of town and plan on coming to visit patient shortly. Caregiver reports that family had made comments about SNF placement. CSW left CSW contact information and will await for dtr to call CSW once she arrives.    CSW spoke with Rosendo Gros at [facility] who reports that patient is from ALF but can come to SNF if that is family's desire. CSW agreeable to keep facility updated on patient's plans. FL2 completed and placed in chart.   Assessment/plan status:  Psychosocial Support/Ongoing Assessment of Needs Other assessment/ plan:   Information/referral to community resources:   Will return to [place] but ALF vs SNF has not been determined-MD ordered PT evaluation to assist with decision.     PATIENT'S/FAMILY'S RESPONSE TO PLAN OF CARE: Patient alert but confused. Patient's caregiver is involved and has worked with patient for 8-9 years. Caregiver reports that family is very involved but are coming from out of town to visit patient. Caregiver believes that family desires SNF placement but reports she will ask dtr to call CSW to confirm plans. Patient thanked CSW for visit and reports she will be happy to return home.     [place], [place] [phone]

## 2014-07-09 NOTE — Progress Notes (Signed)
ANTIBIOTIC CONSULT NOTE - INITIAL  Pharmacy Consult for Cefepime and Vancomycin Indication: HCAP  Allergies  Allergen Reactions  . Codeine Other (See Comments)    Unknown reaction Per [facility]     Patient Measurements:   Wt=72 kg  Vital Signs: Temp: 97.2 F (36.2 C) (01/29 0103) Temp Source: Oral (01/29 0103) BP: 143/91 mmHg (01/29 0103) Pulse Rate: 80 (01/29 0103) Intake/Output from previous day: 01/28 0701 - 01/29 0700 In: 50 [I.V.:50] Out: -  Intake/Output from this shift: Total I/O In: 50 [I.V.:50] Out: -   Labs:  Recent Labs  07/08/14 2144  WBC 12.1*  HGB 12.4  PLT 204  CREATININE 0.90   Estimated Creatinine Clearance: 41.9 mL/min (by C-G formula based on Cr of 0.9). No results for input(s): VANCOTROUGH, VANCOPEAK, VANCORANDOM, GENTTROUGH, GENTPEAK, GENTRANDOM, TOBRATROUGH, TOBRAPEAK, TOBRARND, AMIKACINPEAK, AMIKACINTROU, AMIKACIN in the last 72 hours.   Microbiology: No results found for this or any previous visit (from the past 720 hour(s)).  Medical History: Past Medical History  Diagnosis Date  . Gastroesophageal reflux disease   . Atrial fibrillation 2013    a. Dx 02/2012, had NSTEMI at that time. Rate control/anticoag.  . Alzheimer's dementia   . Endometrial hyperplasia with atypia 1995  . Personal history of colonic polyps 2000  . Hyperlipidemia   . Hypertension   . Macular degeneration   . Osteoarthritis   . Osteopenia   . NSTEMI (non-ST elevated myocardial infarction) 02/2012    a. Cath: large filling defect apical LAD suggestive of embolus - LM, RCA, LCx OK. Cath complicated by delirium  . Insomnia 11/16/2012  . Coronary embolism     a. 02/2012 with NSTEMI (see above).  . Cardiomyopathy     a. EF 45% by cath 02/2012, 30% by echocardiogram.    Medications:  Scheduled:  . aspirin  81 mg Oral Daily  . atenolol  50 mg Oral Q breakfast  . ceFEPime (MAXIPIME) IV  1 g Intravenous Q12H  . cholecalciferol  1,000 Units Oral Daily  . diltiazem  240  mg Oral Daily  . DULoxetine  60 mg Oral Q breakfast  . enoxaparin (LOVENOX) injection  40 mg Subcutaneous Q24H  . memantine  28 mg Oral Q breakfast  . sodium chloride  3 mL Intravenous Q12H  . vancomycin  1,000 mg Intravenous Q24H   Infusions:   Assessment: 31 yoF admitted with altered mental status/acute respiratory failure.  Cefepime and Vancomycin per Rx for HCAP.   Goal of Therapy:  Vancomycin trough level 15-20 mcg/ml  Plan:   Cefepime 1gm IV q12h  Vancomycin 1gm IV q24h  F/U scr/levels/culures as needed  Susanne Greenhouse R 07/09/2014,1:19 AM

## 2014-07-09 NOTE — Progress Notes (Addendum)
Patient ID: [name], female   DOB: [birth year], 81 y.o.   MRN: [id number]  [facility] PROGRESS NOTE  Radiah Lubinski [facility] WUJ:[id number] DOB: 1921-07-09 DOA: 07/08/2014 PCP: [name], DO  Brief narrative:    81 year old female with a history of Alzheimer's dementia, chronic atrial fibrillation, cardiomyopathy who presented to [facility] ED from assisted-living facility with worsening mental status changes for past few days prior to this admission. Patient has sustained mechanical fall on 07/05/2014 resulting in a left humeral neck fracture. Patient was subsequently discharged to ALF adn her arm was put in sling. Apparently per notes in EPIC, by Dr. Renato Gails on 07/07/14, there was report from the bartender indicated patient had 3 scotch and waters and nursing noted she was intoxicated at the time of her fall on 07/05/2014.  On this admission, pt was hemodynamically stable. CT head showed no acute intracranial findings. Patient was found to have right upper lung lobe pneumonia and was started on broad-spectrum antibiotics, vancomycin and cefepime.  Assessment/Plan:    Principal Problem: Acute encephalopathy  - Likely because of pneumonia but as mentioned above patient noted to be intoxicated on 07/05/2014. We'll check ethanol level although this may be normal since this admission. - CT head did not show acute intracranial findings. - Continue supportive care with IV fluids for hydration, analgesia as needed. - Continue treatment for pneumonia with vancomycin and cefepime. - Physical therapy evaluation - order placed, evaluation pending.   Active Problems: Acute respiratory failure with hypoxia / healthcare associated pneumonia / leukocytosis - Patient is mildly hypoxic on the admission with oxygen saturation 94% with nasal cannula oxygen support. Respiratory status remains stable since the admission. - Patient treated with vancomycin and cefepime for healthcare associated pneumonia. - Continue  to monitor CBC.  Chronic atrial fibrillation - Rate controlled with atenolol and Cardizem - Continue aspirin daily  Essential Hypertension - Continue atenolol and diltiazem  Alzheimer's dementia - Continue Namenda  Left humeral neck fracture - Remain in sling for now - spoke with ortho on call and recommended to keep in sling and follow up in their office per sch appt, 07/14/2014.  CAD with hx of NSTEMI - Continue ASA and BB   DVT Prophylaxis  - Stop Lovenox for DVT prophylaxis because of left upper extremity hematoma. Use SCDs bilaterally.  Code Status: Full.  Family Communication:  plan of care discussed with the patient Disposition Plan: Home when stable.   IV access:  Peripheral IV  Procedures and diagnostic studies:    Dg Chest 2 View 07/08/2014   Right upper lobe infiltrate, possibly pneumonia or aspiration. Follow-up is necessary to confirm complete clearing.   Dg Elbow Complete Left 07/06/2014    There are chronic postsurgical changes but there is no acute bony abnormality of the left elbow.   Dg Forearm Left 07/06/2014   There is no acute bony abnormality of the left radius or ulna.   Ct Head Wo Contrast 07/08/2014   1. No acute intracranial pathology seen on CT. 2. Moderate age-appropriate cortical volume loss and diffuse small vessel ischemic microangiopathy.    Dg Shoulder Left 07/06/2014 There is an acute displaced fracture through the neck of the left humerus. As best as can be determined the humeral head remains related to the bony glenoid.     Medical Consultants:  None  Other Consultants:  Physical therapy  IAnti-Infectives:   Vancomycin 07/08/2014 --> Cefepime 07/08/2014 -->   Manson Passey, MD  [facility] Pager [phone]  If  7PM-7AM, please contact night-coverage [email or web address] Password [email or web address] 07/09/2014, 10:35 AM   LOS: 1 day    HPI/Subjective: No acute overnight events.  Objective: Filed Vitals:   07/08/14 2245 07/09/14 0035  07/09/14 0103 07/09/14 0644  BP:  137/63 143/91 140/86  Pulse: 157 75 80 101  Temp:  98.1 F (36.7 C) 97.2 F (36.2 C) 98.5 F (36.9 C)  TempSrc:  Oral Oral Oral  Resp: 17 17 19 16   SpO2: 98% 98% 95% 94%    Intake/Output Summary (Last 24 hours) at 07/09/14 1035 Last data filed at 07/09/14 0013  Gross per 24 hour  Intake     50 ml  Output      0 ml  Net     50 ml    Exam:   General:  Pt is alert, dementia, no distress  Cardiovascular: Tachycardic, S1, S2 appreciated  Respiratory: Clear to auscultation bilaterally, no wheezing, no crackles, no rhonchi  Abdomen: Soft, non tender, non distended, bowel sounds present  Extremities: large hematoma on LUE, trace LE pitting edema, pulses palpable; LUE in sling  Neuro: No focal deficits  Data Reviewed: Basic Metabolic Panel:  Recent Labs Lab 07/08/14 2144 07/09/14 0525  NA 143 142  K 4.3 3.9  CL 113* 106  CO2 21 28  GLUCOSE 109* 120*  BUN 28* 26*  CREATININE 0.90 0.75  CALCIUM 8.9 8.9   Liver Function Tests:  Recent Labs Lab 07/08/14 2144  AST 17  ALT 13  ALKPHOS 64  BILITOT 0.6  PROT 6.7  ALBUMIN 3.6   No results for input(s): LIPASE, AMYLASE in the last 168 hours. No results for input(s): AMMONIA in the last 168 hours. CBC:  Recent Labs Lab 07/08/14 2144 07/09/14 0525  WBC 12.1* 10.6*  NEUTROABS 9.1*  --   HGB 12.4 11.9*  HCT 38.7 37.5  MCV 93.5 93.5  PLT 204 249   Cardiac Enzymes: No results for input(s): CKTOTAL, CKMB, CKMBINDEX, TROPONINI in the last 168 hours. BNP: Invalid input(s): POCBNP CBG: No results for input(s): GLUCAP in the last 168 hours.  MRSA PCR Screening     Status: Abnormal   Collection Time: 07/09/14  1:37 AM  Result Value Ref Range Status   MRSA by PCR POSITIVE (A) NEGATIVE Final     Scheduled Meds: . aspirin  81 mg Oral Daily  . atenolol  50 mg Oral Q breakfast  . ceFEPime (MAXIPIME)   1 g Intravenous Q12H  . diltiazem  240 mg Oral Daily  . DULoxetine  60 mg  Oral Q breakfast  . enoxaparin (LOVENOX) injection  40 mg Subcutaneous Q24H  . memantine  28 mg Oral Q breakfast  . vancomycin  1,000 mg Intravenous Q24H

## 2014-07-09 NOTE — Progress Notes (Signed)
CARE MANAGEMENT NOTE 07/09/2014  Patient:  [name], [name]   Account Number:  [id number]  Date Initiated:  07/09/2014  Documentation initiated by:  Ainsley Sanguinetti  Subjective/Objective Assessment:   pt with hx of recent fall and intoxication humeral head fracture now presents with dyspnea and temp     Action/Plan:   home when stable   Anticipated DC Date:  07/12/2014   Anticipated DC Plan:  HOME/SELF CARE  In-house referral  NA      DC Planning Services  NA      [facility] Choice  NA   Choice offered to / List presented to:  NA           Status of service:  In process, will continue to follow Medicare Important Message given?   (If response is "NO", the following Medicare IM given date fields will be blank) Date Medicare IM given:   Medicare IM given by:   Date Additional Medicare IM given:   Additional Medicare IM given by:    Discharge Disposition:    Per UR Regulation:  Reviewed for med. necessity/level of care/duration of stay  If discussed at Long Length of Stay Meetings, dates discussed:    Comments:  Jan. 29, 16/[name] L. Earlene Plater, RN, BSN, CCM/Case Management [facility] [phone] No discharge needs present of time of review.

## 2014-07-10 DIAGNOSIS — D72829 Elevated white blood cell count, unspecified: Secondary | ICD-10-CM

## 2014-07-10 LAB — URINE CULTURE
Colony Count: NO GROWTH
Culture: NO GROWTH

## 2014-07-10 NOTE — Clinical Social Work Note (Signed)
CSW reviewed PT evaluation which reflected SNF recommendation  CSW called and spoke with pt's daughter, Larita Fife who wants pt to go to SNF at [facility] since she is a resident of the ALF at [place]  CSW uploaded PT evaluation into system for [facility] review  CSW will follow up with [facility] for pt discharge when ready  Elray Buba, LCSW [facility] Clinical Social Worker - Weekend Coverage cell #: [phone]

## 2014-07-10 NOTE — Progress Notes (Signed)
Family requesting low dose sleep medication.

## 2014-07-10 NOTE — Progress Notes (Addendum)
Patient ID: [name], female   DOB: 12-Nov-1921, 81 y.o.   MRN: [id number] [facility] PROGRESS NOTE  Micheala Makarewicz [facility] JGG:[id number] DOB: [birth year] DOA: 07/08/2014 PCP: [name], DO  Brief narrative:    81 year old female with a history of Alzheimer's dementia, chronic atrial fibrillation, cardiomyopathy who presented to [facility] ED from assisted-living facility with worsening mental status changes for past few days prior to this admission. Patient has sustained mechanical fall on 07/05/2014 resulting in a left humeral neck fracture. Patient was subsequently discharged to ALF adn her arm was put in sling. Apparently per notes in EPIC, by Dr. Renato Gails on 07/07/14, there was report from the bartender indicated patient had 3 scotch and waters and nursing noted she was intoxicated at the time of her fall on 07/05/2014.  On this admission, pt was hemodynamically stable. CT head showed no acute intracranial findings. Patient was found to have right upper lung lobe pneumonia and was started on broad-spectrum antibiotics, vancomycin and cefepime.  Assessment/Plan:    Principal Problem: Acute metabolic encephalopathy  - Likely because of pneumonia but as mentioned above patient noted to be intoxicated on 07/05/2014. Ethanol level check 07/09/2014 and was WNL - CT head did not show acute intracranial findings. - Continue supportive care with IV fluids for hydration, analgesia as needed. Needs SLP since caregiver reported some cough with sips of fluids.  - Physical therapy recommends SNF placement. Pt currently in ALF.   Active Problems: Acute respiratory failure with hypoxia / healthcare associated pneumonia / leukocytosis - Patient noted to be mildly hypoxic on the admission with oxygen saturation 94% with nasal cannula oxygen support. Respiratory status remains stable since the admission. - Continue treatment with vancomycin and cefepime for healthcare associated pneumonia. - WBC count rending  down. Blood cultures not collected at the time of the admission.   Chronic atrial fibrillation - Rate controlled with atenolol and Cardizem - Continue aspirin daily  Essential Hypertension - Continue atenolol and diltiazem  Alzheimer's dementia - Continue Namenda - no significant changes in mental status since admission   Left humeral neck fracture - Remain in sling for now - spoke with ortho who recommended to keep in sling and follow up in their office per sch appt, 07/14/2014.  CAD with hx of NSTEMI - Continue ASA and BB   DVT Prophylaxis  - Stopped Lovenox for DVT prophylaxis because of left upper extremity hematoma. Using SCDs bilaterally.  Code Status: Full.  Family Communication:  plan of care discussed with the patient's daughters at the bedside this am, caregiver at the bedside as well  Disposition Plan: to SNF when stable.  IV access:  Peripheral IV  Procedures and diagnostic studies:    Dg Chest 2 View 07/08/2014   Right upper lobe infiltrate, possibly pneumonia or aspiration. Follow-up is necessary to confirm complete clearing.   Dg Elbow Complete Left 07/06/2014    There are chronic postsurgical changes but there is no acute bony abnormality of the left elbow.   Dg Forearm Left 07/06/2014   There is no acute bony abnormality of the left radius or ulna.   Ct Head Wo Contrast 07/08/2014   1. No acute intracranial pathology seen on CT. 2. Moderate age-appropriate cortical volume loss and diffuse small vessel ischemic microangiopathy.    Dg Shoulder Left 07/06/2014 There is an acute displaced fracture through the neck of the left humerus. As best as can be determined the humeral head remains related to the bony glenoid.  Medical Consultants:  None  Other Consultants:  Physical therapy  IAnti-Infectives:   Vancomycin 07/08/2014 --> Cefepime 07/08/2014 -->    Manson Passey, MD  [facility] Pager [phone]  If 7PM-7AM, please contact  night-coverage [email or web address] Password [email or web address] 07/10/2014, 2:54 PM   LOS: 2 days    HPI/Subjective: No acute overnight events.  Objective: Filed Vitals:   07/09/14 0103 07/09/14 0644 07/09/14 1415 07/09/14 2122  BP: 143/91 140/86 125/54 156/92  Pulse: 80 101 76   Temp: 97.2 F (36.2 C) 98.5 F (36.9 C) 98 F (36.7 C) 98.3 F (36.8 C)  TempSrc: Oral Oral Oral   Resp: SpO2: 95% 94% 97%     Intake/Output Summary (Last 24 hours) at 07/10/14 1454 Last data filed at 07/10/14 0832  Gross per 24 hour  Intake    340 ml  Output      0 ml  Net    340 ml    Exam:   General:  Pt is sleeping, no distress  Cardiovascular: Regular rate and rhythm, S1/S2 (+)  Respiratory: diminished breath sounds bilaterally, no wheezing   Abdomen: Soft, non tender, non distended, bowel sounds present  Extremities: LUE ecchymosis, in sling, pulses DP and PT palpable bilaterally  Neuro: Grossly nonfocal  Data Reviewed: Basic Metabolic Panel:  Recent Labs Lab 07/08/14 2144 07/09/14 0525  NA 143 142  K 4.3 3.9  CL 113* 106  CO2 21 28  GLUCOSE 109* 120*  BUN 28* 26*  CREATININE 0.90 0.75  CALCIUM 8.9 8.9   Liver Function Tests:  Recent Labs Lab 07/08/14 2144  AST 17  ALT 13  ALKPHOS 64  BILITOT 0.6  PROT 6.7  ALBUMIN 3.6   No results for input(s): LIPASE, AMYLASE in the last 168 hours. No results for input(s): AMMONIA in the last 168 hours. CBC:  Recent Labs Lab 07/08/14 2144 07/09/14 0525  WBC 12.1* 10.6*  NEUTROABS 9.1*  --   HGB 12.4 11.9*  HCT 38.7 37.5  MCV 93.5 93.5  PLT 204 249   Cardiac Enzymes: No results for input(s): CKTOTAL, CKMB, CKMBINDEX, TROPONINI in the last 168 hours. BNP: Invalid input(s): POCBNP CBG: No results for input(s): GLUCAP in the last 168 hours.  Urine culture     Status: None   Collection Time: 07/09/14  1:30 AM  Result Value Ref Range Status   Specimen Description URINE, CATHETERIZED  Final   Special Requests NONE   Final   Culture NO GROWTH Performed at [facility]   Final   Report Status 07/10/2014 FINAL  Final  MRSA PCR Screening     Status: Abnormal   Collection Time: 07/09/14  1:37 AM  Result Value Ref Range Status   MRSA by PCR POSITIVE (A) NEGATIVE Final     Scheduled Meds: . aspirin  81 mg Oral Daily  . atenolol  50 mg Oral Q breakfast  . ceFEPime (MAXIPIME)   1 g Intravenous Q12H  . cholecalciferol  1,000 Units Oral Daily  . diltiazem  240 mg Oral Daily  . DULoxetine  60 mg Oral Q breakfast  . memantine  28 mg Oral Q breakfast  . mupirocin ointment  1 application Nasal BID  . vancomycin  1,000 mg Intravenous Q24H

## 2014-07-11 NOTE — Clinical Social Work Placement (Addendum)
Clinical Social Work Department CLINICAL SOCIAL WORK PLACEMENT NOTE 07/11/2014  Patient:  [name], [name]  Account Number:  [id number] Admit date:  07/08/2014  Clinical Social Worker:  Elray Buba, CLINICAL SOCIAL WORKER  Date/time:  07/11/2014 04:37 PM  Clinical Social Work is seeking post-discharge placement for this patient at the following level of care:   SKILLED NURSING   (*CSW will update this form in Epic as items are completed)   07/10/2014  Patient/family provided with [facility] Department of Clinical Social Work's list of facilities offering this level of care within the geographic area requested by the patient (or if unable, by the patient's family).  07/10/2014  Patient/family informed of their freedom to choose among providers that offer the needed level of care, that participate in Medicare, Medicaid or managed care program needed by the patient, have an available bed and are willing to accept the patient.  07/10/2014  Patient/family informed of [facility]' ownership interest in [facility], as well as of the fact that they are under no obligation to receive care at this facility.  PASARR submitted to EDS on 07/11/2014 PASARR number received on 07/11/2014  FL2 transmitted to all facilities in geographic area requested by pt/family on  07/10/2014 FL2 transmitted to all facilities within larger geographic area on   Patient informed that his/her managed care company has contracts with or will negotiate with  certain facilities, including the following:     Patient/family informed of bed offers received:   Patient chooses bed at [facility] Physician recommends and patient chooses bed at  [facility]  Patient to be transferred to [facility] on 07/12/14   Patient to be transferred to facility by [facility] Patient and family notified of transfer on 07/12/14 Name of family member notified:  Dtrs at bedside  The following physician request were entered in  Epic:   Additional Comments: .Elray Buba, LCSW [facility] Clinical Social Worker - Weekend Coverage cell #: [phone]

## 2014-07-11 NOTE — Progress Notes (Signed)
Patient ID: [name], female   DOB: Sep 24, 1921, 81 y.o.   MRN: [id number]   [facility] PROGRESS NOTE  [name] DOB: 01/19/1922 DOA: 07/08/2014 PCP: [name], DO  Brief narrative:   81 year old female with a history of Alzheimer's dementia, chronic atrial fibrillation, and cardiomyopathy who presented to [facility] ED from her assisted-living facility with worsening mental status changes for past few days prior to this admission. Patient sustained a mechanical fall on 07/05/2014 due to intoxication resulting in a left humeral neck fracture. Patient was subsequently discharged to ALF w/ her arm in a sling.  On this admission, pt was hemodynamically stable. CT head showed no acute intracranial findings. Patient was found to have right upper lung lobe pneumonia and was started on broad-spectrum antibiotics, vancomycin and cefepime.  Assessment/Plan:    Acute metabolic encephalopathy  - Likely because of pneumonia + now sundowning in setting of hospitalization - slowly improving  - CT head did not show acute intracranial findings. - SLP eval requested since caregiver reported some common coughing with sips of fluids - Physical therapy recommends SNF placement - pt has been accepted at [facility]  Acute respiratory failure with hypoxia / healthcare associated pneumonia / leukocytosis - Patient noted to be mildly hypoxic on the admission  - Continue treatment with vancomycin and cefepime for healthcare associated pneumonia. - WBC count rending down. Blood cultures not collected at the time of the admission.   UTI - UA c/w UTI - urine culture unrevealing - should be covered w/ abx being used for HCAP   Chronic atrial fibrillation - Rate controlled with atenolol and Cardizem - Continue aspirin daily - not an anticoag candidate due to recent falls / high risk for repeat falls currently   Essential Hypertension - Continue atenolol and diltiazem  Alzheimer's  dementia - Continue Namenda   Left humeral neck fracture - Remain in sling for now - Ortho recommended to keep in sling and follow up in their office per sch appt 07/14/2014.  CAD with hx of NSTEMI - Continue ASA and BB  MRSA screen +   DVT Prophylaxis  SCDs  Code Status: Full.  Family Communication:  plan of care discussed with the patient's daughters Disposition Plan: to SNF 07/12/2014 if remains stable   Procedures and diagnostic studies:    Ct Head Wo Contrast 07/08/2014   1. No acute intracranial pathology seen on CT. 2. Moderate age-appropriate cortical volume loss and diffuse small vessel ischemic microangiopathy.    Medical Consultants:  None  Anti-Infectives:   Vancomycin 07/08/2014 > Cefepime 07/08/2014 >  Lonia Blood, MD [facility] For Consults/Admissions - Flow Manager - [phone] Office  [phone]  Contact MD directly via text page:      [email or web address]      password [facility]  07/11/2014, 5:31 PM   LOS: 3 days    HPI/Subjective: Pt is alert but very mildly confused.  She appears to be comfortable.    Objective: Filed Vitals:   07/10/14 1430 07/10/14 2134 07/11/14 0629 07/11/14 1402  BP: 164/91 166/83 138/86 140/58  Pulse: 83 94 88 100  Temp: 98 F (36.7 C) 98.2 F (36.8 C) 98.3 F (36.8 C) 97.9 F (36.6 C)  TempSrc: Oral Oral Oral Oral  Resp: 18 18 20 18   SpO2: 97% 95% 97% 98%    Intake/Output Summary (Last 24 hours) at 07/11/14 1731 Last data filed at 07/11/14 1400  Gross per 24 hour  Intake  0 ml  Output    150 ml  Net   -150 ml    Exam: General: No acute respiratory distress Lungs: Clear to auscultation bilaterally without wheezes or crackles Cardiovascular: Regular rate and rhythm without murmur gallop or rub normal S1 and S2 Abdomen: Nontender, nondistended, soft, bowel sounds positive, no rebound, no ascites, no appreciable mass Extremities: No significant cyanosis, clubbing, or edema bilateral lower  extremities   Data Reviewed: Basic Metabolic Panel:  Recent Labs Lab 07/08/14 2144 07/09/14 0525  NA 143 142  K 4.3 3.9  CL 113* 106  CO2 21 28  GLUCOSE 109* 120*  BUN 28* 26*  CREATININE 0.90 0.75  CALCIUM 8.9 8.9   Liver Function Tests:  Recent Labs Lab 07/08/14 2144  AST 17  ALT 13  ALKPHOS 64  BILITOT 0.6  PROT 6.7  ALBUMIN 3.6   CBC:  Recent Labs Lab 07/08/14 2144 07/09/14 0525  WBC 12.1* 10.6*  NEUTROABS 9.1*  --   HGB 12.4 11.9*  HCT 38.7 37.5  MCV 93.5 93.5  PLT 204 249    Urine culture     Status: None   Collection Time: 07/09/14  1:30 AM  Result Value Ref Range Status   Specimen Description URINE, CATHETERIZED  Final   Special Requests NONE  Final   Culture NO GROWTH Performed at [facility]   Final   Report Status 07/10/2014 FINAL  Final  MRSA PCR Screening     Status: Abnormal   Collection Time: 07/09/14  1:37 AM  Result Value Ref Range Status   MRSA by PCR POSITIVE (A) NEGATIVE Final     Scheduled Meds: . aspirin  81 mg Oral Daily  . atenolol  50 mg Oral Q breakfast  . ceFEPime (MAXIPIME)   1 g Intravenous Q12H  . cholecalciferol  1,000 Units Oral Daily  . diltiazem  240 mg Oral Daily  . DULoxetine  60 mg Oral Q breakfast  . memantine  28 mg Oral Q breakfast  . mupirocin ointment  1 application Nasal BID  . vancomycin  1,000 mg Intravenous Q24H

## 2014-07-12 DIAGNOSIS — R4182 Altered mental status, unspecified: Secondary | ICD-10-CM

## 2014-07-12 LAB — BASIC METABOLIC PANEL
Anion gap: 10 (ref 5–15)
BUN: 21 mg/dL (ref 6–23)
CO2: 25 mmol/L (ref 19–32)
Calcium: 8.8 mg/dL (ref 8.4–10.5)
Chloride: 103 mmol/L (ref 96–112)
Creatinine, Ser: 0.63 mg/dL (ref 0.50–1.10)
GFR calc Af Amer: 89 mL/min — ABNORMAL LOW (ref >90)
GFR, EST NON AFRICAN AMERICAN: 77 mL/min — AB (ref >90)
Glucose, Bld: 114 mg/dL — ABNORMAL HIGH (ref 70–99)
POTASSIUM: 3.3 mmol/L — AB (ref 3.5–5.1)
Sodium: 138 mmol/L (ref 135–145)

## 2014-07-12 MED ORDER — POTASSIUM CHLORIDE CRYS ER 20 MEQ PO TBCR: 20.0000 meq | EXTENDED_RELEASE_TABLET | Freq: Every day | ORAL | Status: DC

## 2014-07-12 MED ORDER — HYDROCODONE-ACETAMINOPHEN 5-325 MG PO TABS: 1.0000 | ORAL_TABLET | Freq: Four times a day (QID) | ORAL | Status: DC | PRN

## 2014-07-12 MED ORDER — POTASSIUM CHLORIDE CRYS ER 20 MEQ PO TBCR: 40.0000 meq | EXTENDED_RELEASE_TABLET | Freq: Once | ORAL | Status: DC

## 2014-07-12 MED ORDER — LEVOFLOXACIN 500 MG PO TABS: 500.0000 mg | ORAL_TABLET | Freq: Every day | ORAL | Status: DC

## 2014-07-12 NOTE — Progress Notes (Signed)
Physical Therapy Treatment Patient Details Name: [name] MRN: [id number] DOB: 1921-07-13 Today's Date: 07/12/2014    History of Present Illness 81 yo female admitted with AMS, Pna, L humerus fx. Hx of dementia, A fib, macular degeneration, NSTEMI. Pt is from ALF    PT Comments    Pt assisted with ambulating in hallway however distance limited by dizziness and fatigue.  Vitals upon return to recliner: 114/104 mmHg, 98% SpO2 on room air, and 94 bpm with MD present.    Follow Up Recommendations  SNF;Supervision/Assistance - 24 hour     Equipment Recommendations  None recommended by PT    Recommendations for Other Services       Precautions / Restrictions Precautions Precautions: Shoulder;Fall Shoulder Interventions: Shoulder sling/immobilizer;At all times Restrictions Weight Bearing Restrictions: Yes LUE Weight Bearing: Non weight bearing    Mobility  Bed Mobility Overal bed mobility: Needs Assistance Bed Mobility: Supine to Sit     Supine to sit: HOB elevated;Mod assist     General bed mobility comments: assist for scooting hip to EOB with bed pad, slight assist for trunk upright, pt prefers her own method despite cues provided  Transfers Overall transfer level: Needs assistance Equipment used: 1 person hand held assist Transfers: Sit to/from Stand Sit to Stand: Mod assist;+2 physical assistance;+2 safety/equipment;From elevated surface         General transfer comment: Assist to rise, stabilize, control descent. Multimodal cues for safety, technique, hand placement  Ambulation/Gait Ambulation/Gait assistance: Mod assist;+2 physical assistance Ambulation Distance (Feet): 40 Feet (x2) Assistive device: 1 person hand held assist Gait Pattern/deviations: Step-through pattern;Decreased weight shift to left Gait velocity: decr   General Gait Details: Assist to provide stabilization and 1HHA with increased weight shift to Righ side. Pt is unsteady and at risk  for falling as well as had dizziness requiring seated rest break for safety(pt became quiet and eyes closing during gait and did not report dizziness until being seated). Fatigues easily.    Stairs            Wheelchair Mobility    Modified Rankin (Stroke Patients Only)       Balance Overall balance assessment: Needs assistance;History of Falls         Standing balance support: Single extremity supported;During functional activity Standing balance-Leahy Scale: Poor                      Cognition Arousal/Alertness: Awake/alert Behavior During Therapy: WFL for tasks assessed/performed Overall Cognitive Status: No family/caregiver present to determine baseline cognitive functioning (seemed confused at times) Area of Impairment: Orientation Orientation Level: Disoriented to;Situation;Place   Memory: Decreased recall of precautions              Exercises      General Comments        Pertinent Vitals/Pain Pain Assessment: Faces Faces Pain Scale: Hurts little more Pain Location: L shoulder with mobility Pain Descriptors / Indicators: Sore Pain Intervention(s): Limited activity within patient's tolerance;Monitored during session;Repositioned    Home Living                      Prior Function            PT Goals (current goals can now be found in the care plan section) Progress towards PT goals: Progressing toward goals    Frequency  Min 3X/week    PT Plan Current plan remains appropriate    Co-evaluation  End of Session Equipment Utilized During Treatment: Gait belt Activity Tolerance: Patient limited by fatigue Patient left: in chair;with call bell/phone within reach;with family/visitor present (with MD)     Time: 2130-8657 PT Time Calculation (min) (ACUTE ONLY): 17 min  Charges:  $Gait Training: 8-22 mins                    G Codes:      Ladana Chavero,KATHrine E 23-Jul-2014, 1:34 PM Zenovia Jarred, PT,  DPT July 23, 2014 Pager: [phone]

## 2014-07-12 NOTE — Evaluation (Signed)
Clinical/Bedside Swallow Evaluation Patient Details  Name: ABBEE Osborn MRN: [id number] Date of Birth: 21-Aug-1921  Today's Date: 07/12/2014 Time: SLP Start Time (ACUTE ONLY): 1105 SLP Stop Time (ACUTE ONLY): 1125 SLP Time Calculation (min) (ACUTE ONLY): 20 min  Past Medical History:  Past Medical History  Diagnosis Date  . Gastroesophageal reflux disease   . Atrial fibrillation 2013    a. Dx 02/2012, had NSTEMI at that time. Rate control/anticoag.  . Alzheimer's dementia   . Endometrial hyperplasia with atypia 1995  . Personal history of colonic polyps 2000  . Hyperlipidemia   . Hypertension   . Macular degeneration   . Osteoarthritis   . Osteopenia   . NSTEMI (non-ST elevated myocardial infarction) 02/2012    a. Cath: large filling defect apical LAD suggestive of embolus - LM, RCA, LCx OK. Cath complicated by delirium  . Insomnia 11/16/2012  . Coronary embolism     a. 02/2012 with NSTEMI (see above).  . Cardiomyopathy     a. EF 45% by cath 02/2012, 30% by echocardiogram.   Past Surgical History:  Past Surgical History  Procedure Laterality Date  . Appendectomy  1977  . Abdominal hysterectomy  1995    TAH/BSO  . Left heart catheterization with coronary angiogram N/A 02/19/2012    Procedure: LEFT HEART CATHETERIZATION WITH CORONARY ANGIOGRAM;  Surgeon: Tonny Bollman, MD;  Location: [facility] CATH LAB;  Service: Cardiovascular;  Laterality: N/A;   HPI:  81 yo female adm to [facility] 1/28 with AMS, pt is s/p fall 07/05/14 with left humeral fx.  Pt has h/o Alzheimer's disease, Afib. CT head negative.  CXR 1/28 RUL infiltrate - ? pna, asp.  Swallow eval ordered.    Assessment / Plan / Recommendation Clinical Impression  Pt presents with functional oropharyngeal swallow ability with no indications of airway compromise with intake.  Pt denies dysphagia - observed pt consuming pills with thin (she did extend head backward to help orally transit pill) without significant deficits.  Advised pt to keep  head in neutral posture to swallow pills.  Minimal oral holding noted with thin but effectively cleared.  Recommend continue regular/thin diet with precautions.  Educated pt to recommendations/findings.  Will sign off, please reorder if indicated.  Thanks.     Aspiration Risk  Mild    Diet Recommendation Regular;Thin liquid   Liquid Administration via: Cup;Straw Medication Administration: Whole meds with liquid Supervision: Patient able to self feed Compensations: Slow rate;Small sips/bites Postural Changes and/or Swallow Maneuvers: Seated upright 90 degrees;Upright 30-60 min after meal    Other  Recommendations Oral Care Recommendations: Oral care BID   Follow Up Recommendations       Frequency and Duration        Pertinent Vitals/Pain Afebrile, decreased       Swallow Study Prior Functional Status    see hhx   General Date of Onset: 07/12/14 HPI: 81 yo female adm to [facility] 1/28 with AMS, pt is s/p fall 07/05/14 with left humeral fx.  Pt has h/o Alzheimer's disease, Afib. CT head negative.  CXR 1/28 RUL infiltrate - ? pna, asp.  Swallow eval ordered.  Type of Study: Bedside swallow evaluation Diet Prior to this Study: Regular;Thin liquids Temperature Spikes Noted: No Respiratory Status: Room air History of Recent Intubation: No Behavior/Cognition: Alert;Cooperative;Pleasant mood Oral Cavity - Dentition: Adequate natural dentition Self-Feeding Abilities: Able to feed self;Needs set up (due to inability to use left arm) Patient Positioning: Upright in bed Baseline Vocal Quality: Clear Volitional Cough:  Strong Volitional Swallow: Able to elicit    Oral/Motor/Sensory Function Overall Oral Motor/Sensory Function: Appears within functional limits for tasks assessed   Ice Chips Ice chips: Not tested   Thin Liquid Thin Liquid: Within functional limits Presentation: Straw    Nectar Thick Nectar Thick Liquid: Not tested   Honey Thick Honey Thick Liquid: Not tested   Puree Puree:  Within functional limits Presentation: Self Fed   Solid   GO    Solid: Within functional limits Presentation: Self Kathryn Pick, MS [facility] SLP [phone]

## 2014-07-12 NOTE — Discharge Summary (Addendum)
  Physician Discharge Summary  [name] MRN: [id number] DOB/AGE: 09/11/1921 81 y.o.  PCP: [name], [name], DO   Admit date: 07/08/2014 Discharge date: 07/12/2014  Discharge Diagnoses:     Hypertension   Cardiomyopathy   HCAP (healthcare-associated pneumonia)   Acute encephalopathy   Fracture of neck of left humerus  Follow-up recommendations Follow-up with PCP in 5-7 days Follow-up CBC, CMP in 1 week      Medication List    STOP taking these medications        loperamide 2 MG tablet  Commonly known as:  IMODIUM A-D      TAKE these medications        acetaminophen 325 MG tablet  Commonly known as:  TYLENOL  Take 650 mg by mouth every 6 (six) hours as needed for mild pain, fever or headache.     aspirin 81 MG tablet  Take 81 mg by mouth daily.     atenolol 50 MG tablet  Commonly known as:  TENORMIN  Take 50 mg by mouth daily with breakfast.     diltiazem 240 MG 24 hr capsule  Commonly known as:  DILACOR XR  Take one tablet by mouth once daily     DULoxetine 60 MG capsule  Commonly known as:  CYMBALTA  Take 60 mg by mouth daily with breakfast.     HYDROcodone-acetaminophen 5-325 MG per tablet  Commonly known as:  NORCO/VICODIN  Take 1 tablet by mouth every 6 (six) hours as needed for moderate pain or severe pain.     levofloxacin 500 MG tablet  Commonly known as:  LEVAQUIN  Take 1 tablet (500 mg total) by mouth daily.     memantine 28 MG Cp24 24 hr capsule  Commonly known as:  NAMENDA XR  Take 28 mg by mouth daily with breakfast.     ondansetron 4 MG tablet  Commonly known as:  ZOFRAN  Take 4 mg by mouth daily with breakfast.     potassium chloride SA 20 MEQ tablet  Commonly known as:  K-DUR,KLOR-CON  Take 1 tablet (20 mEq total) by mouth daily.     RESTORA PO  Take 1 tablet by mouth daily with breakfast.     Suvorexant 15 MG Tabs  Commonly known as:  BELSOMRA  Take 15 mg by mouth at bedtime as needed.     Vitamin D 1000 UNITS  capsule  Take 1,000 Units by mouth daily.        Discharge Condition:    Disposition: 01-Home or Self Care   Consults: None  Significant Diagnostic Studies: Dg Chest 2 View  07/08/2014   CLINICAL DATA:  Confusion.  Atrial fibrillation.  Hypertension.  EXAM: CHEST  2 VIEW  COMPARISON:  02/18/2012  FINDINGS: There is a consolidated right upper lobe infiltrate with mild volume loss. Hilar, mediastinal and cardiac contours are unremarkable and unchanged. There are no effusions.  IMPRESSION: Right upper lobe infiltrate, possibly pneumonia or aspiration. Follow-up is necessary to confirm complete clearing.   Electronically Signed   By: Daniel R Mitchell M.D.   On: 07/08/2014 22:34   Dg Elbow Complete Left  07/06/2014   CLINICAL DATA:  Status post fall yesterday now left arm and left shoulder pain  EXAM: LEFT ELBOW - COMPLETE 3+ VIEW  COMPARISON:  Left forearm of July 06, 2014  FINDINGS: There is a cortical screw placed through the proximal and shaft of the ulna which is intact. The radial head is absent. The distal   humerus is intact. No acute fracture of the elbow is demonstrated. There is diffuse osteopenia. There is no definite joint effusion.  IMPRESSION: There are chronic postsurgical changes but there is no acute bony abnormality of the left elbow.   Electronically Signed   By: David  Jordan   On: 07/06/2014 07:41   Dg Forearm Left  07/06/2014   CLINICAL DATA:  Status post fall on ice yesterday with persistent left shoulder and arm pain  EXAM: LEFT FOREARM - 2 VIEW  COMPARISON:  Left elbow of today's date  FINDINGS: The bones are diffusely osteopenic. There is a cortical screw placed through the proximal and midportions of the shaft of the the left ulna presumably for stabilization of a previous fracture. The radial head is likely surgically absent. No acute fracture of the left radius or ulna is demonstrated. The observed portions of the left wrist are unremarkable. The soft tissues are  unremarkable.  IMPRESSION: There is no acute bony abnormality of the left radius or ulna.   Electronically Signed   By: David  Jordan   On: 07/06/2014 07:44   Ct Head Wo Contrast  07/08/2014   CLINICAL DATA:  Acute onset of confusion.  Initial encounter.  EXAM: CT HEAD WITHOUT CONTRAST  TECHNIQUE: Contiguous axial images were obtained from the base of the skull through the vertex without intravenous contrast.  COMPARISON:  None.  FINDINGS: There is no evidence of acute infarction, mass lesion, or intra- or extra-axial hemorrhage on CT.  Prominence of the ventricles and sulci reflects moderate cortical volume loss. Cerebellar atrophy is noted. Diffuse periventricular and subcortical white matter change likely reflects small vessel ischemic microangiopathy.  The brainstem and fourth ventricle are within normal limits. The basal ganglia are unremarkable in appearance. The cerebral hemispheres demonstrate grossly normal gray-white differentiation. No mass effect or midline shift is seen.  There is no evidence of fracture; visualized osseous structures are unremarkable in appearance. The visualized portions of the orbits are within normal limits. The paranasal sinuses and mastoid air cells are well-aerated. No significant soft tissue abnormalities are seen.  IMPRESSION: 1. No acute intracranial pathology seen on CT. 2. Moderate age-appropriate cortical volume loss and diffuse small vessel ischemic microangiopathy.   Electronically Signed   By: Jeffery  Chang M.D.   On: 07/08/2014 22:45   Dg Shoulder Left  07/06/2014   CLINICAL DATA:  Status post fall. Colitis yesterday now with left arm and shoulder pain  EXAM: LEFT SHOULDER - 2+ VIEW  COMPARISON:  None.  FINDINGS: The two views submitted are limited in positioning. There is an acute displaced fracture through the neck of the humerus. The observed portions of the scapula are intact. The AC joint exhibits degenerative change but no acute fracture. The observed  portions of the left clavicle and upper left ribs are intact.  IMPRESSION: There is an acute displaced fracture through the neck of the left humerus. As best as can be determined the humeral head remains related to the bony glenoid.   Electronically Signed   By: David  Jordan   On: 07/06/2014 07:43      Microbiology: Recent Results (from the past 240 hour(s))  Urine culture     Status: None   Collection Time: 07/09/14  1:30 AM  Result Value Ref Range Status   Specimen Description URINE, CATHETERIZED  Final   Special Requests NONE  Final   Colony Count NO GROWTH Performed at [facility]   Final   Culture NO GROWTH   Performed at [facility]   Final   Report Status 07/10/2014 FINAL  Final  MRSA PCR Screening     Status: Abnormal   Collection Time: 07/09/14  1:37 AM  Result Value Ref Range Status   MRSA by PCR POSITIVE (A) NEGATIVE Final    Comment:        The GeneXpert MRSA Assay (FDA approved for NASAL specimens only), is one component of a comprehensive MRSA colonization surveillance program. It is not intended to diagnose MRSA infection nor to guide or monitor treatment for MRSA infections. RESULT CALLED TO, READ BACK BY AND VERIFIED WITH: K. YOUNG RN AT [phone] ON 01.29.16 BY SHUEA      Labs: Results for orders placed or performed during the hospital encounter of 07/08/14 (from the past 48 hour(s))  Basic metabolic panel     Status: Abnormal   Collection Time: 07/12/14  7:38 AM  Result Value Ref Range   Sodium 138 135 - 145 mmol/L   Potassium 3.3 (L) 3.5 - 5.1 mmol/L   Chloride 103 96 - 112 mmol/L   CO2 25 19 - 32 mmol/L   Glucose, Bld 114 (H) 70 - 99 mg/dL   BUN 21 6 - 23 mg/dL   Creatinine, Ser 0.63 0.50 - 1.10 mg/dL   Calcium 8.8 8.4 - 10.5 mg/dL   GFR calc non Af Amer 77 (L) >90 mL/min   GFR calc Af Amer 89 (L) >90 mL/min    Comment: (NOTE) The eGFR has been calculated using the CKD EPI equation. This calculation has not been validated in all  clinical situations. eGFR's persistently <90 mL/min signify possible Chronic Kidney Disease.    Anion gap 10 5 - 15     HPI :81 year old female with a history of Alzheimer's dementia, chronic atrial fibrillation,dementia, afib, gerd, macular degeneration,hyperlipidemia, insomnia, coronary embolism in context of NSTEMI in 2013, and cardiomyopathy presents with altered mental status of 1-2 day duration. The patient had a mechanical fall on 07/05/2014 resulting in a left humeral neck fracture. The patient was seen in the emergency department and subsequently sent back to [facility] with followup appointment with Dr. Marcelino Scot on 07/14/14. The patient's left upper extremity was placed in a sling. According to the progress note by Dr. Mariea Clonts on 07/07/14, there was report from the bartender indicated she'd had 3 scotch and waters and nursing noted she was intoxicated at the time of her fall on the ice outside. Apparently, the patient's confusion worsened on the morning of admission. In addition, the patient was noted to be hypoxemic at her facility prompting transfer to the emergency department. Presently, the patient is unable to provide any history due to her confusion. All of this history is obtained from review of the medical records and speaking with the patient's caretaker at the bedside. At baseline, the patient is able to ambulate and perform all her activities of daily living without assistance. Her caregiver has also noted increasing confusion within the past 1-2 days. This been no reports of headache, chest pain, respiratory distress, vomiting, diarrhea. When the patient was discharged back to her facility on 07/06/2014, the patient was given hydrocodone. In the emergency department, the patient had oxygen saturation of 100% on 2 L. The patient was noted to be hypoxemic with oxygen saturation 88% on room air. She was given vancomycin and cefepime. WBC was 12.1. Chest x-ray showed right upper lobe infiltrate  with small left effusion. EKG showed atrial fibrillation with nonspecific ST changes. Metabolic panel was  pending at the time of my evaluation.   HOSPITAL COURSE:   Acute metabolic encephalopathy  - Likely because of pneumonia , patient alert and oriented during my exam and ambulated the halls- CT head did not show acute intracranial findings. - SLP eval recommends regular diet with thin liquids- Physical therapy recommends SNF placement - pt has been accepted at [facility] Patient's daughter by the bedside and agreeable to the plan to discharge to SNF today  Acute respiratory failure with hypoxia / healthcare associated pneumonia / leukocytosis - Patient noted to be mildly hypoxic on the admission -this has resolved Started on vancomycin and cefepime for healthcare associated pneumonia. 4 days - WBC count rending down. Blood cultures not collected at the time of the admission.  Continue levofloxacin for another 4 days and then discontinue  UTI - UA c/w UTI - urine culture unrevealing - should be covered w/ abx being used for HCAP   Chronic atrial fibrillation - Rate controlled with atenolol and Cardizem - Continue aspirin daily - not an anticoag candidate due to recent falls / high risk for repeat falls currently Kindly note patient is an occasional drinker according to the daughters and she has never had problems with alcohol dependence  Essential Hypertension - Continue atenolol and diltiazem  Alzheimer's dementia - Continue Namenda   Left humeral neck fracture - Remain in sling for now - Ortho recommended to keep in sling and follow up in their office per sch appt 07/14/2014.  CAD with hx of NSTEMI - Continue ASA and BB  MRSA screen +   DVT Prophylaxis  SCDs  Code Status: Full.    Discharge Exam: *   Blood pressure 140/80, pulse 97, temperature 98.3 F (36.8 C), temperature source Oral, resp. rate 20, height 5' 8" (1.727 m), SpO2 97 %.  General: A&O x 3,  NAD, nontoxic, pleasant/cooperative Head/Eye: No conjunctival hemorrhage, no icterus, [place]/AT, No nystagmus ENT: No icterus, No thrush, good dentition, no pharyngeal exudate Neck: No masses, no lymphadenpathy, no meningismus CV: IRRR, no rub, no gallop, no S3 Lung: Bilateral scattered rales, right greater than left. no wheezing Abdomen: soft/NT, +BS, nondistended, no peritoneal signs       Discharge Instructions    Diet - low sodium heart healthy    Complete by:  As directed      Increase activity slowly    Complete by:  As directed            Follow-up Information    Follow up with HANDY,MICHAEL H, MD On 07/14/2014.   Specialty:  Orthopedic Surgery   Why:  Follow up appt after recent hospitalization   Contact information:   [address] [place] [address] [phone]       Signed: , 07/12/2014, 12:12 PM          

## 2014-07-12 NOTE — Progress Notes (Signed)
ANTIBIOTIC CONSULT NOTE - FOLLOW UP  Pharmacy Consult for cefepime Indication: HCAP  Allergies  Allergen Reactions  . Codeine Other (See Comments)    Unknown reaction Per [facility]     Patient Measurements: Height: 5\' 8"  (172.7 cm) IBW/kg (Calculated) : 63.9   Vital Signs: Temp: 98.3 F (36.8 C) (02/01 0453) Temp Source: Oral (02/01 0453) BP: 140/80 mmHg (02/01 0530) Pulse Rate: 97 (02/01 0453) Intake/Output from previous day: 01/31 0701 - 02/01 0700 In: -  Out: 300 [Urine:300] Intake/Output from this shift: Total I/O In: 120 [P.O.:120] Out: -   Labs:  Recent Labs  07/12/14 0738  CREATININE 0.63   Estimated Creatinine Clearance: 47.1 mL/min (by C-G formula based on Cr of 0.63).    Microbiology: 1/29 Urine: NGF (but U/A + for pyuria) 1/29 MRSA PCR Screen: positive   Antimicrobials: 1/28 >>cefepime >> 1/28 >>vancomycin >> 2/1  Assessment: 81 y/o admitted 1/28 with HCAP and probable UTI, was started on empiric cefepime and vancomycin with pharmacy dosing assistance.    Today, 07/12/2014 D#4 cefepime 1 gram IV q12h Vancomycin was discontinued by MD Remains afebrile Leukocytosis was improving when WBC last checked (1/29) SCr stable Cultures unrevealing Plans noted for discharge to SNF on PO Levaquin when bed available  Goal of Therapy:  Appropriate antibiotic dosing for indication and renal function; eradication of infection.   Plan:  1. No change to current cefepime dosage (1 gram IV q12h) 2. Await discharge to SNF when bed available, then switching to PO Levaquin 500mg  daily as per MD order for another 4 days.  Elie Goody, PharmD, BCPS Pager: [phone] 07/12/2014  1:18 PM

## 2014-07-12 NOTE — Progress Notes (Signed)
Clinical Social Work  CSW faxed DC summary to [facility] who is agreeable to accept patient today after 4pm. CSW prepared DC packet with FL2, DC summary and hard scripts included. CSW met with patient and two dtrs at bedside who report that they are happy that patient is returning to her home today and feels she will get great treatment in the skilled area. Patient and family prefer [facility] to provide transportation and is aware of no guarantee of payment. RN to call report to [phone]. [facility] arranged for 3:30pm pick up. [facility] #: [id number].  CSW is signing off but available if needed.  [place], [place] [phone]

## 2014-07-13 ENCOUNTER — Non-Acute Institutional Stay (SKILLED_NURSING_FACILITY): Payer: Medicare Other | Admitting: Internal Medicine

## 2014-07-13 DIAGNOSIS — G309 Alzheimer's disease, unspecified: Secondary | ICD-10-CM

## 2014-07-13 DIAGNOSIS — S42212D Unspecified displaced fracture of surgical neck of left humerus, subsequent encounter for fracture with routine healing: Secondary | ICD-10-CM

## 2014-07-13 DIAGNOSIS — J181 Lobar pneumonia, unspecified organism: Secondary | ICD-10-CM

## 2014-07-13 DIAGNOSIS — F0280 Dementia in other diseases classified elsewhere without behavioral disturbance: Secondary | ICD-10-CM

## 2014-07-13 DIAGNOSIS — R410 Disorientation, unspecified: Secondary | ICD-10-CM

## 2014-07-13 DIAGNOSIS — I482 Chronic atrial fibrillation, unspecified: Secondary | ICD-10-CM

## 2014-07-13 DIAGNOSIS — J189 Pneumonia, unspecified organism: Secondary | ICD-10-CM

## 2014-07-13 DIAGNOSIS — M810 Age-related osteoporosis without current pathological fracture: Secondary | ICD-10-CM

## 2014-07-13 NOTE — Progress Notes (Signed)
Patient ID: [name], female   DOB: Jul 21, 1921, 81 y.o.   MRN: [id number]  Provider:  Gwenith Spitz. Renato Gails, D.O., C.M.D. Location:  [facility]  PCP: Shiron Whetsel, DO  Code Status: full code   Allergies  Allergen Reactions  . Codeine Other (See Comments)    Unknown reaction Per [facility]     Chief Complaint  Patient presents with  . New Admit To SNF    admitted to rehab s/p hospitalization with HCAP    HPI: 81 y.o. female with h/o Alzheimer's disease who had been living in AL, htn, hyperlipidemia, afib, prior NSTEMI, macular degeneration, ischemic cardiomyopathy was seen upon admission to rehab s/p hospitalization with health care associated pneumonia. I had seen her in AL due to delirium after the fall with left humeral fracture.  She was delirious from her hydrodocone pain medication.  This was then changed to tramadol.  She remained confused.   She was taken to the ED for this as it persisted.  She was found to be hypoxic in the ED to 88% on RA.  Her wbc was 12.1.  CXR showed RUL infiltrate with small left effusion.  EKG showed afib with nonspecific ST changes.  She was treated with vanc and cefepime.  As she improved, she was able to ambulate with her walker at the hospital and was said to be oriented.  She was converted to levaquin to complete 4 more days.  Her urinalysis was positive, but nothing grew on culture.  Her afib remained rate controlled.    She is to have f/u cbc, bmp and keep her f/u appt 07/14/14 with Dr. Carola Frost orthopedics about her left humeral neck fx.  ROS: Review of Systems  Constitutional: Positive for malaise/fatigue. Negative for fever.  Eyes: Positive for blurred vision.  Respiratory: Negative for shortness of breath.   Cardiovascular: Negative for chest pain and leg swelling.  Gastrointestinal: Negative for abdominal pain.  Genitourinary: Negative for dysuria.  Musculoskeletal: Positive for falls.       No additional falls; says arm does not hurt right  now  Neurological: Positive for weakness. Negative for dizziness.  Endo/Heme/Allergies: Bruises/bleeds easily.  Psychiatric/Behavioral: Positive for memory loss.    Past Medical History  Diagnosis Date  . Gastroesophageal reflux disease   . Atrial fibrillation 2013    a. Dx 02/2012, had NSTEMI at that time. Rate control/anticoag.  . Alzheimer's dementia   . Endometrial hyperplasia with atypia 1995  . Personal history of colonic polyps 2000  . Hyperlipidemia   . Hypertension   . Macular degeneration   . Osteoarthritis   . Osteopenia   . NSTEMI (non-ST elevated myocardial infarction) 02/2012    a. Cath: large filling defect apical LAD suggestive of embolus - LM, RCA, LCx OK. Cath complicated by delirium  . Insomnia 11/16/2012  . Coronary embolism     a. 02/2012 with NSTEMI (see above).  . Cardiomyopathy     a. EF 45% by cath 02/2012, 30% by echocardiogram.   Past Surgical History  Procedure Laterality Date  . Appendectomy  1977  . Abdominal hysterectomy  1995    TAH/BSO  . Left heart catheterization with coronary angiogram N/A 02/19/2012    Procedure: LEFT HEART CATHETERIZATION WITH CORONARY ANGIOGRAM;  Surgeon: Tonny Bollman, MD;  Location: [facility] CATH LAB;  Service: Cardiovascular;  Laterality: N/A;   Social History:   reports that she quit smoking about 41 years ago. Her smoking use included Cigarettes. She has never used  smokeless tobacco. She reports that she drinks about 1.2 oz of alcohol per week. She reports that she does not use illicit drugs.  Family History  Problem Relation Age of Onset  . Heart disease Father     Medications: Patient's Medications  New Prescriptions   No medications on file  Previous Medications   ACETAMINOPHEN (TYLENOL) 325 MG TABLET    Take 650 mg by mouth every 6 (six) hours as needed for mild pain, fever or headache.   ASPIRIN 81 MG TABLET    Take 81 mg by mouth daily.    ATENOLOL (TENORMIN) 50 MG TABLET    Take 50 mg by mouth daily with  breakfast.    CHOLECALCIFEROL (VITAMIN D) 1000 UNITS CAPSULE    Take 1,000 Units by mouth daily.   DILTIAZEM (DILACOR XR) 240 MG 24 HR CAPSULE    Take one tablet by mouth once daily   DULOXETINE (CYMBALTA) 60 MG CAPSULE    Take 60 mg by mouth daily with breakfast.   HYDROCODONE-ACETAMINOPHEN (NORCO/VICODIN) 5-325 MG PER TABLET    Take 1 tablet by mouth every 6 (six) hours as needed for moderate pain or severe pain.   LEVOFLOXACIN (LEVAQUIN) 500 MG TABLET    Take 1 tablet (500 mg total) by mouth daily.   MEMANTINE (NAMENDA XR) 28 MG CP24 24 HR CAPSULE    Take 28 mg by mouth daily with breakfast.   ONDANSETRON (ZOFRAN) 4 MG TABLET    Take 4 mg by mouth daily with breakfast.    POTASSIUM CHLORIDE SA (K-DUR,KLOR-CON) 20 MEQ TABLET    Take 1 tablet (20 mEq total) by mouth daily.   PROBIOTIC PRODUCT (RESTORA PO)    Take 1 tablet by mouth daily with breakfast.    SUVOREXANT (BELSOMRA) 15 MG TABS    Take 15 mg by mouth at bedtime as needed.  Modified Medications   No medications on file  Discontinued Medications   No medications on file   Physical Exam: There were no vitals filed for this visit. Physical Exam  Constitutional: She appears well-developed and well-nourished. No distress.  HENT:  Head: Normocephalic and atraumatic.  Right Ear: External ear normal.  Left Ear: External ear normal.  Nose: Nose normal.  Mouth/Throat: Oropharynx is clear and moist. No oropharyngeal exudate.  Eyes: Conjunctivae and EOM are normal. Pupils are equal, round, and reactive to light.  Neck: Normal range of motion. Neck supple.  Cardiovascular: Normal heart sounds and intact distal pulses.   irreg irreg  Pulmonary/Chest: Effort normal and breath sounds normal. She has no wheezes. She has no rales.  Abdominal: Soft. Bowel sounds are normal. She exhibits no distension and no mass. There is no tenderness.  Musculoskeletal: She exhibits tenderness.  Left arm in sling  Neurological: No cranial nerve deficit.    Oriented to person only, drowsy  Skin: Skin is warm and dry. There is pallor.  Psychiatric:  Flat affect    Labs reviewed: Basic Metabolic Panel:  Recent Labs  16/10/96 2144 07/09/14 0525 07/12/14 0738  NA 143 142 138  K 4.3 3.9 3.3*  CL 113* 106 103  CO2 GLUCOSE 109* 120* 114*  BUN 28* 26* 21  CREATININE 0.90 0.75 0.63  CALCIUM 8.9 8.9 8.8   Liver Function Tests:  Recent Labs  10/06/13 07/08/14 2144  AST 12* 17  ALT 12 13  ALKPHOS 109 64  BILITOT  --  0.6  PROT  --  6.7  ALBUMIN  --  3.6  CBC:  Recent Labs  10/06/13 07/08/14 2144 07/09/14 0525  WBC 6.9 12.1* 10.6*  NEUTROABS  --  9.1*  --   HGB 12.3 12.4 11.9*  HCT 36 38.7 37.5  MCV  --  93.5 93.5  PLT 318 204 249   Cardiac Enzymes:  Recent Labs  10/20/13  CKTOTAL 18.0   Imaging and Procedures: 07/08/14:  CXR:  Right upper lobe infiltrate--pneumonia vs. Aspiration  07/08/14:  CT brain:  No acute intracranial pathology, moderate age-appropriate cortical volume loss and diffuse small vessel ischemic microangiopathy  Assessment/Plan 1. Acute delirium -due to narcotics, pain, pneumonia -completing course of levaquin -f/u cbc -will also change her from hydrocodone to tramadol again to try to decrease sedation and confusion  2. Right upper lobe pneumonia -completing levaquin -f/u cbc, bmp -st eval and tx due to her rightsided pneumonia--?aspiration component that developed when she was getting pain meds for her fracture  3. Alzheimer disease -appears she also has some vascular dementia based on ct brain -cont namenda XR, suspect not on aricept due to anorexia difficulties  4. Humeral surgical neck fracture, left, with routine healing, subsequent encounter -change to tramadol for moderate to severe pain, tylenol for mild -cont sling -f/u with orthopedics, Dr. Carola Frost 2/3 -cont pt, ot  5. Chronic atrial fibrillation -cont asa 81mg  , atenolol, diltiazem  6. Senile osteoporosis -cont  vitamin D; ? Previous bisphosphonate/dexa  Functional status:  Dependent at present in adls, but was able to do them on her own before fall, lived in [place]; now needs snf  Family/ staff Communication: her daughters were not here today, did discuss with nursing staff  Labs/tests ordered:  Cbc, bmp, f/u Dr. Carola Frost

## 2014-07-18 ENCOUNTER — Encounter: Payer: Self-pay | Admitting: Internal Medicine

## 2014-07-18 MED ORDER — TRAMADOL HCL 50 MG PO TABS: 25.0000 mg | ORAL_TABLET | Freq: Four times a day (QID) | ORAL | Status: DC | PRN

## 2014-07-20 LAB — HEPATIC FUNCTION PANEL
ALT: 31 U/L (ref 7–35)
AST: 36 U/L — AB (ref 13–35)
Alkaline Phosphatase: 92 U/L (ref 25–125)
BILIRUBIN, TOTAL: 0.3 mg/dL

## 2014-07-20 LAB — BASIC METABOLIC PANEL
BUN: 17 mg/dL (ref 4–21)
Creatinine: 0.8 mg/dL (ref 0.5–1.1)
GLUCOSE: 140 mg/dL
POTASSIUM: 4.1 mmol/L (ref 3.4–5.3)
SODIUM: 142 mmol/L (ref 137–147)

## 2014-07-20 LAB — CBC AND DIFFERENTIAL
HCT: 37 % (ref 36–46)
HEMOGLOBIN: 12.1 g/dL (ref 12.0–16.0)
Platelets: 345 10*3/uL (ref 150–399)
WBC: 14.4 10^3/mL

## 2014-07-21 ENCOUNTER — Non-Acute Institutional Stay (SKILLED_NURSING_FACILITY): Payer: Medicare Other | Admitting: Internal Medicine

## 2014-07-21 DIAGNOSIS — G309 Alzheimer's disease, unspecified: Secondary | ICD-10-CM

## 2014-07-21 DIAGNOSIS — S42212D Unspecified displaced fracture of surgical neck of left humerus, subsequent encounter for fracture with routine healing: Secondary | ICD-10-CM

## 2014-07-21 DIAGNOSIS — J690 Pneumonitis due to inhalation of food and vomit: Secondary | ICD-10-CM

## 2014-07-21 DIAGNOSIS — F0280 Dementia in other diseases classified elsewhere without behavioral disturbance: Secondary | ICD-10-CM

## 2014-07-22 ENCOUNTER — Other Ambulatory Visit (HOSPITAL_COMMUNITY): Payer: Self-pay | Admitting: Internal Medicine

## 2014-07-22 DIAGNOSIS — R1310 Dysphagia, unspecified: Secondary | ICD-10-CM

## 2014-07-22 NOTE — Progress Notes (Signed)
Patient ID: [name], female   DOB: 31-Jul-1921, 81 y.o.   MRN: [id number]  Location:  [facility] Provider:  Gwenith Spitz. Renato Gails, D.O., C.M.D.   Chief Complaint  Patient presents with  . Acute Visit    increased leukocytosis    HPI:  81 yo white female here for rehab s/p fall with left arm fracture, then complicated by hypoxia and development of pneumonia. She was treated at the hospital with empiric abx and changed to levaquin which she completed.  She was noted on her f/u labs now to have increased leukocytosis, her caregiver notes increased dyspnea on exertion, cxr was still positive.  When seen, she herself denies any problems (has dementia).    Review of Systems:  Review of Systems  Unable to perform ROS: dementia   ROS from pt was unreliable Medications: Patient's Medications  New Prescriptions   No medications on file  Previous Medications   ACETAMINOPHEN (TYLENOL) 325 MG TABLET    Take 650 mg by mouth every 6 (six) hours as needed for mild pain, fever or headache.   ASPIRIN 81 MG TABLET    Take 81 mg by mouth daily.    ATENOLOL (TENORMIN) 50 MG TABLET    Take 50 mg by mouth daily with breakfast.    CHOLECALCIFEROL (VITAMIN D) 1000 UNITS CAPSULE    Take 1,000 Units by mouth daily.   DILTIAZEM (DILACOR XR) 240 MG 24 HR CAPSULE    Take one tablet by mouth once daily   DULOXETINE (CYMBALTA) 60 MG CAPSULE    Take 60 mg by mouth daily with breakfast.   LEVOFLOXACIN (LEVAQUIN) 500 MG TABLET    Take 1 tablet (500 mg total) by mouth daily.   MEMANTINE (NAMENDA XR) 28 MG CP24 24 HR CAPSULE    Take 28 mg by mouth daily with breakfast.   ONDANSETRON (ZOFRAN) 4 MG TABLET    Take 4 mg by mouth daily with breakfast.    POTASSIUM CHLORIDE SA (K-DUR,KLOR-CON) 20 MEQ TABLET    Take 1 tablet (20 mEq total) by mouth daily.   PROBIOTIC PRODUCT (RESTORA PO)    Take 1 tablet by mouth daily with breakfast.    TRAMADOL (ULTRAM) 50 MG TABLET    Take 0.5 tablets (25 mg total) by mouth every 6  (six) hours as needed. moderate pain and  po q 6 hrs prn severe pain  Modified Medications   No medications on file  Discontinued Medications   No medications on file    Physical Exam: Filed Vitals:   07/21/14 1225  BP: 157/89  Pulse: 89  Temp: 97.9 F (36.6 C)  Resp: 20  Height: 5' 5.8" (1.671 m)  Weight: 157 lb 9.6 oz (71.487 kg)  SpO2: 96%   Physical Exam  Cardiovascular: Normal rate, regular rhythm and normal heart sounds.   Pulmonary/Chest: Effort normal. She has wheezes.  Coarse rhonchi right  Abdominal: Soft. Bowel sounds are normal.  Musculoskeletal: Normal range of motion.  No tenderness of left arm anymore  Neurological: She is alert.  Confused, gets agitated when we discuss her pneumonia--does not think anything is wrong with her  Skin: Skin is warm and dry.    Labs reviewed: Basic Metabolic Panel:  Recent Labs  40/98/11 2144 07/09/14 0525 07/12/14 0738  NA 143 142 138  K 4.3 3.9 3.3*  CL 113* 106 103  CO2 GLUCOSE 109* 120* 114*  BUN 28* 26* 21  CREATININE 0.90 0.75 0.63  CALCIUM 8.9 8.9 8.8    Liver Function Tests:  Recent Labs  10/06/13 07/08/14 2144  AST 12* 17  ALT 12 13  ALKPHOS 109 64  BILITOT  --  0.6  PROT  --  6.7  ALBUMIN  --  3.6    CBC:  Recent Labs  10/06/13 07/08/14 2144 07/09/14 0525  WBC 6.9 12.1* 10.6*  NEUTROABS  --  9.1*  --   HGB 12.3 12.4 11.9*  HCT 36 38.7 37.5  MCV  --  93.5 93.5  PLT 318 204 249   Assessment/Plan 1. Aspiration pneumonia, unspecified aspiration pneumonia type increased wbc, sob on exertion, persistent right rhonchi on exam and diminished breath sounds, CXR still positive; has repeat in future Aspiration precautions ST eval and tx Start back on abx with augmentin for 10 days   2. Alzheimer disease -cont namenda XR and assistance with ADLs except feeding due to dementia and recent fall  3. Humeral surgical neck fracture, left, with routine healing, subsequent  encounter -pain much improved, tolerating tramadol  Family/ staff Communication: discussed with her caregiver who said she would relay the information to pt's children  Goals of care: short term rehab with goal to move to SNF level when bed available  Labs/tests ordered:  Has f/u cxr

## 2014-07-23 ENCOUNTER — Other Ambulatory Visit (HOSPITAL_COMMUNITY): Payer: Self-pay | Admitting: Internal Medicine

## 2014-07-23 DIAGNOSIS — R1310 Dysphagia, unspecified: Secondary | ICD-10-CM

## 2014-07-26 ENCOUNTER — Other Ambulatory Visit (HOSPITAL_COMMUNITY): Payer: Medicare Other

## 2014-07-26 ENCOUNTER — Ambulatory Visit (HOSPITAL_COMMUNITY): Payer: Self-pay

## 2014-07-26 ENCOUNTER — Encounter: Payer: Self-pay | Admitting: Internal Medicine

## 2014-07-26 ENCOUNTER — Ambulatory Visit (HOSPITAL_COMMUNITY): Payer: Medicare Other

## 2014-07-26 ENCOUNTER — Other Ambulatory Visit (HOSPITAL_COMMUNITY): Payer: Self-pay

## 2014-07-27 ENCOUNTER — Encounter: Payer: Self-pay | Admitting: Internal Medicine

## 2014-07-27 ENCOUNTER — Non-Acute Institutional Stay (SKILLED_NURSING_FACILITY): Payer: Medicare Other | Admitting: Internal Medicine

## 2014-07-27 DIAGNOSIS — G309 Alzheimer's disease, unspecified: Secondary | ICD-10-CM

## 2014-07-27 DIAGNOSIS — J690 Pneumonitis due to inhalation of food and vomit: Secondary | ICD-10-CM

## 2014-07-27 DIAGNOSIS — S42212D Unspecified displaced fracture of surgical neck of left humerus, subsequent encounter for fracture with routine healing: Secondary | ICD-10-CM

## 2014-07-27 DIAGNOSIS — F0280 Dementia in other diseases classified elsewhere without behavioral disturbance: Secondary | ICD-10-CM

## 2014-07-27 NOTE — Progress Notes (Signed)
Patient ID: [name], female   DOB: 1921-10-21, 81 y.o.   MRN: [id number]  Location:  [facility] Provider:  Gwenith Spitz. Renato Gails, D.O., C.M.D.  Code status:  Full code  Chief Complaint  Patient presents with  . Acute Visit    increased confusion per her daughter [name], questions about when she can return to AL    HPI:  81 yo white female here for rehab s/p fall with left arm fracture, then complicated by hypoxia and development of pneumonia. She was treated at the hospital with empiric abx and changed to levaquin which she completed.  She was noted on her f/u labs now to have increased leukocytosis, her caregiver notes increased dyspnea on exertion, cxr was still positive.   She remains adamant about returning to her AL room b/c she has things to do.  Her daughter [name] is also inquiring about this possibility.   I was asked to see her today due to this and concerns about her lungs sounding worse and being very ill.  Pt remains extremely upset about being in rehab, but her memory is very poor and she thinks she is going back home to her family, but she lives in AL alone.    Review of Systems:  ROS ROS from pt was unreliable  Medications: Patient's Medications  New Prescriptions   No medications on file  Previous Medications   ACETAMINOPHEN (TYLENOL) 325 MG TABLET    Take 650 mg by mouth every 6 (six) hours as needed for mild pain, fever or headache.   AMOXICILLIN-CLAVULANATE (AUGMENTIN) 875-125 MG PER TABLET    Take 1 tablet by mouth 2 (two) times daily.   ASPIRIN 81 MG TABLET    Take 81 mg by mouth daily.    ATENOLOL (TENORMIN) 50 MG TABLET    Take 50 mg by mouth daily with breakfast.    CHOLECALCIFEROL (VITAMIN D) 1000 UNITS CAPSULE    Take 1,000 Units by mouth daily.   DILTIAZEM (DILACOR XR) 240 MG 24 HR CAPSULE    Take one tablet by mouth once daily   DULOXETINE (CYMBALTA) 60 MG CAPSULE    Take 60 mg by mouth daily with breakfast.   MEMANTINE (NAMENDA XR) 28 MG CP24 24 HR  CAPSULE    Take 28 mg by mouth daily with breakfast.   ONDANSETRON (ZOFRAN) 4 MG TABLET    Take 4 mg by mouth daily with breakfast.    POTASSIUM CHLORIDE SA (K-DUR,KLOR-CON) 20 MEQ TABLET    Take 1 tablet (20 mEq total) by mouth daily.   PROBIOTIC PRODUCT (RESTORA PO)    Take 1 tablet by mouth daily with breakfast.    SUVOREXANT 10 MG TABS    Take 1 tablet by mouth at bedtime as needed (insomnia).   TRAMADOL (ULTRAM) 50 MG TABLET    Take 0.5 tablets (25 mg total) by mouth every 6 (six) hours as needed. moderate pain and  po q 6 hrs prn severe pain  Modified Medications   No medications on file  Discontinued Medications   LEVOFLOXACIN (LEVAQUIN) 500 MG TABLET    Take 1 tablet (500 mg total) by mouth daily.    Physical Exam: Filed Vitals:   07/27/14 0952  BP: 158/93  Pulse: 85  Temp: 98.8 F (37.1 C)  Resp: 18  Height: 5' 5.8" (1.671 m)  Weight: 157 lb 9.6 oz (71.487 kg)  SpO2: 97%   Physical Exam  Cardiovascular: Normal rate, regular rhythm and normal heart sounds.  Pulmonary/Chest: Effort normal.  Coarse rhonchi right remain, but a bit improved from last exam  Abdominal: Soft. Bowel sounds are normal.  Musculoskeletal: Normal range of motion. She exhibits tenderness.  Today has tenderness of left upper arm   Neurological: She is alert.  Confused, gets agitated when we discuss her pneumonia--does not think anything is wrong with her  Skin: Skin is warm and dry.    Labs reviewed: Basic Metabolic Panel:  Recent Labs  76/28/31 2144 07/09/14 0525 07/12/14 0738  NA 143 142 138  K 4.3 3.9 3.3*  CL 113* 106 103  CO2 21 28 25   GLUCOSE 109* 120* 114*  BUN 28* 26* 21  CREATININE 0.90 0.75 0.63  CALCIUM 8.9 8.9 8.8    Liver Function Tests:  Recent Labs  10/06/13 07/08/14 2144  AST 12* 17  ALT 12 13  ALKPHOS 109 64  BILITOT  --  0.6  PROT  --  6.7  ALBUMIN  --  3.6    CBC:  Recent Labs  10/06/13 07/08/14 2144 07/09/14 0525  WBC 6.9 12.1* 10.6*    NEUTROABS  --  9.1*  --   HGB 12.3 12.4 11.9*  HCT 36 38.7 37.5  MCV  --  93.5 93.5  PLT 318 204 249   Assessment/Plan 1. Aspiration pneumonia, unspecified aspiration pneumonia type Remains rhonchorous right moreso than left Nursing encouraging her to use incentive spirometry Aspiration precautions ST working with her--on nectar thickened liquids with regular NAS diet Barium swallow ordered has been delayed to thurs due to weather yesterday Completes augmentin today  2. Alzheimer disease -cont namenda XR and assistance with ADLs except feeding due to dementia and recent fall; does not seem safe to return to AL setting to me at this point due to cognitive decline -add aricept 5mg  daily for 1 week, then increase to 10mg  daily thereafter (at same time as namenda XR) -can eventually change to namzaric daily -dementia with behaviors added to diagnoses  3. Humeral surgical neck fracture, left, with routine healing, subsequent encounter -pain much improved, receiving tylenol 1-3x per day -also helped with ice -has f/u with orthopedics tomorrow  Family/ staff Communication: discussed with her caregiver who said she would relay the information to pt's children  Goals of care: short term rehab with goal to move to SNF level when bed available--I don't think she should return to AL long term due to her cognitive state unless he has a 24 hr caregiver  Labs/tests ordered:  Has f/u cxr today, cbc with diff tomorrow am

## 2014-07-28 LAB — CBC AND DIFFERENTIAL
HEMATOCRIT: 37 % (ref 36–46)
Hemoglobin: 12.4 g/dL (ref 12.0–16.0)
PLATELETS: 411 10*3/uL — AB (ref 150–399)
WBC: 9.3 10^3/mL

## 2014-07-29 ENCOUNTER — Ambulatory Visit (HOSPITAL_COMMUNITY)
Admission: RE | Admit: 2014-07-29 | Discharge: 2014-07-29 | Disposition: A | Discharge status: Home / Self Care | Payer: Medicare Other | Source: Ambulatory Visit | Attending: Internal Medicine | Admitting: Internal Medicine

## 2014-07-29 DIAGNOSIS — I4891 Unspecified atrial fibrillation: Secondary | ICD-10-CM | POA: Insufficient documentation

## 2014-07-29 DIAGNOSIS — H3530 Unspecified macular degeneration: Secondary | ICD-10-CM | POA: Diagnosis not present

## 2014-07-29 DIAGNOSIS — I10 Essential (primary) hypertension: Secondary | ICD-10-CM | POA: Diagnosis not present

## 2014-07-29 DIAGNOSIS — M8580 Other specified disorders of bone density and structure, unspecified site: Secondary | ICD-10-CM | POA: Diagnosis not present

## 2014-07-29 DIAGNOSIS — R1310 Dysphagia, unspecified: Secondary | ICD-10-CM | POA: Insufficient documentation

## 2014-07-29 DIAGNOSIS — F0280 Dementia in other diseases classified elsewhere without behavioral disturbance: Secondary | ICD-10-CM | POA: Diagnosis not present

## 2014-07-29 DIAGNOSIS — E785 Hyperlipidemia, unspecified: Secondary | ICD-10-CM | POA: Diagnosis not present

## 2014-07-29 DIAGNOSIS — K219 Gastro-esophageal reflux disease without esophagitis: Secondary | ICD-10-CM | POA: Diagnosis not present

## 2014-07-29 DIAGNOSIS — I252 Old myocardial infarction: Secondary | ICD-10-CM | POA: Diagnosis not present

## 2014-07-29 DIAGNOSIS — I429 Cardiomyopathy, unspecified: Secondary | ICD-10-CM | POA: Insufficient documentation

## 2014-07-29 DIAGNOSIS — G309 Alzheimer's disease, unspecified: Secondary | ICD-10-CM | POA: Insufficient documentation

## 2014-07-29 NOTE — Procedures (Signed)
Objective Swallowing Evaluation: Modified Barium Swallowing Study  Patient Details  Name: CYNITHA BERTE MRN: [id number] Date of Birth: 1921-11-22  Today's Date: 07/29/2014 Time: SLP Start Time (ACUTE ONLY): 1330-SLP Stop Time (ACUTE ONLY): 1355 SLP Time Calculation (min) (ACUTE ONLY): 25 min  Past Medical History:  Past Medical History  Diagnosis Date  . Gastroesophageal reflux disease   . Atrial fibrillation 2013    a. Dx 02/2012, had NSTEMI at that time. Rate control/anticoag.  . Alzheimer's dementia   . Endometrial hyperplasia with atypia 1995  . Personal history of colonic polyps 2000  . Hyperlipidemia   . Hypertension   . Macular degeneration   . Osteoarthritis   . Osteopenia   . NSTEMI (non-ST elevated myocardial infarction) 02/2012    a. Cath: large filling defect apical LAD suggestive of embolus - LM, RCA, LCx OK. Cath complicated by delirium  . Insomnia 11/16/2012  . Coronary embolism     a. 02/2012 with NSTEMI (see above).  . Cardiomyopathy     a. EF 45% by cath 02/2012, 30% by echocardiogram.   Past Surgical History:  Past Surgical History  Procedure Laterality Date  . Appendectomy  1977  . Abdominal hysterectomy  1995    TAH/BSO  . Left heart catheterization with coronary angiogram N/A 02/19/2012    Procedure: LEFT HEART CATHETERIZATION WITH CORONARY ANGIOGRAM;  Surgeon: Tonny Bollman, MD;  Location: [facility] CATH LAB;  Service: Cardiovascular;  Laterality: N/A;   HPI:  HPI: 81 yo female resident of SNF with recent adm to [facility] 1/28 with AMS, s/p fall 07/05/14 with left humeral fx.  Pt has h/o Alzheimer's disease, Afib. CT head negative.  CXR 1/28 RUL infiltrate - ? pna, asp.  Bedside swallow evalution conducted while admitted with findings of normal swallow and no symptoms of dysphagia.  Pt now presents as OP for MBS.     No Data Recorded  Assessment / Plan / Recommendation CHL IP CLINICAL IMPRESSIONS 07/29/2014  Dysphagia Diagnosis WFL  Clinical impression Pt presents  with functional oropharyngeal swallow ability without aspiration/ldeep laryngeal penetration of any consistency tested (thin, nectar, pudding, cracker, tablet).  Swallow was timely and strong with only trace tongue base residuals of liquids.  A single episode of trace laryngeal penetration of thin noted that cleared which is [facility].  Pt observed to extend head upward to aid oral transiting of barium tablet.  Appearance of barium tablet taken with thin lodged in mid-esophagus without pt awareness, further boluses of thin aided clearance.  SLP educated pt and caregiver Osborne Casco to findings and recommendations using teach back for confirmation.           CHL IP DIET RECOMMENDATION 07/29/2014  Diet Recommendations Regular;Thin liquid  Liquid Administration via Cup;Straw  Medication Administration Whole meds with liquid  Compensations Slow rate;Small sips/bites, Start meal with liquids, Consume liquids throughout meal  Postural Changes and/or Swallow Maneuvers Seated upright 90 degrees;Upright 30-60 min after meal     CHL IP OTHER RECOMMENDATIONS 07/29/2014  Recommended Consults (None)  Oral Care Recommendations Oral care BID  Other Recommendations (None)         CHL IP REASON FOR REFERRAL 07/29/2014  Reason for Referral Objectively evaluate swallowing function     CHL IP ORAL PHASE 07/29/2014                    Oral Phase [facility]  CHL IP PHARYNGEAL PHASE 07/29/2014  Pharyngeal Phase WFL                                                                                                                          CHL IP CERVICAL ESOPHAGEAL PHASE 07/29/2014                                                 CHL IP GO 07/29/2014  Functional Assessment Tool Used mbs, clinical judgement  Functional Limitations Swallowing  Swallow Current Status (G9562) CI  Swallow Goal Status  (Z3086) CI  Swallow Discharge Status [phone]) CI                                                                          Mickie Bail [place], [place] [facility] SLP [phone]

## 2014-07-30 NOTE — Progress Notes (Deleted)
EDCM spoke to patient and her family at bedside. Patient is pleasantly confused.  Patient's son lives with patient.  Patient's son Phill reports patient has home services with DSS, 10 hours a week who help patient with her ADL's Patient has a walker, cane, bsc, in the bathroom.  Patient's son reports they have had [place] or [place] in the past for home health services.  Patient's son would like patient to have home health services again.  EDCM provided patient's son with list of home health agencies in [facility], explained services.  Patient's son has chosen [place] for home health services.  Patient's son confirms patient's pcp is Dr. Duane Lope with [facility].  EDCM discussed patient with EDP who placed orders for home health RN, PT, OT, aide and social worker.  Patient's son thankful for services. Patient's son reports, "i don't think we are ready for a facility yet."  No further EDCM needs at this time.

## 2014-08-04 ENCOUNTER — Other Ambulatory Visit: Payer: Self-pay | Admitting: Internal Medicine

## 2014-08-04 ENCOUNTER — Non-Acute Institutional Stay: Payer: Medicare Other | Admitting: Internal Medicine

## 2014-08-04 DIAGNOSIS — F0280 Dementia in other diseases classified elsewhere without behavioral disturbance: Secondary | ICD-10-CM

## 2014-08-04 DIAGNOSIS — R911 Solitary pulmonary nodule: Secondary | ICD-10-CM

## 2014-08-04 DIAGNOSIS — J181 Lobar pneumonia, unspecified organism: Principal | ICD-10-CM

## 2014-08-04 DIAGNOSIS — G309 Alzheimer's disease, unspecified: Secondary | ICD-10-CM

## 2014-08-04 DIAGNOSIS — J690 Pneumonitis due to inhalation of food and vomit: Secondary | ICD-10-CM

## 2014-08-04 DIAGNOSIS — J189 Pneumonia, unspecified organism: Secondary | ICD-10-CM

## 2014-08-04 NOTE — Progress Notes (Signed)
Patient ID: [name], female   DOB: 09/26/1921, 81 y.o.   MRN: [id number]  Location: [facility] Provider:  Gwenith Spitz. Renato Gails, D.O., C.M.D.  Code Status:  Full code  Chief Complaint  Patient presents with  . Acute Visit    CXR with unresolving infiltrate; no showed for barium swallow study    HPI:  81 yo white female long term resident with dementia, htn, insomnia seen for acute visit in her AL apt.  Apparently, she was transferred back there at her family's request with caregivers.  She had been in rehab for left humerus fx, delirium, and aspiration pneumonia.  She completed two courses of antibiotics (levaquin, augmentin) and continues to have an infiltrate on her CXR obtained 08/03/14.  The radiologist now questions if there might be a nodular opacity beneath.    I had a few conversations with her daughter, Lyn, today.  She agrees to the CT scan and asked many questions.  She does not want her mother to know if the CT shows possible malignancy b/c she probably would not put her through extensive investigation and treatment.  She also asked about taking her on a ride or taking her to the beach for a few days.  I ok'd a local ride, but did not recommend going all the way to the beach b/c she is already so disoriented.  Review of Systems:  Review of Systems  Constitutional: Negative for fever, chills and malaise/fatigue.  Respiratory: Negative for shortness of breath.   Cardiovascular: Negative for chest pain.  Gastrointestinal: Negative for abdominal pain.  Genitourinary: Negative for dysuria.  Musculoskeletal: Negative for falls.       Left arm pain; no more falls since last visit  Neurological: Negative for dizziness and weakness.  Psychiatric/Behavioral: Positive for memory loss. The patient is nervous/anxious and has insomnia.     Medications: Patient's Medications  New Prescriptions   No medications on file  Previous Medications   ACETAMINOPHEN (TYLENOL) 325 MG TABLET     Take 650 mg by mouth every 6 (six) hours as needed for mild pain, fever or headache.   ASPIRIN 81 MG TABLET    Take 81 mg by mouth daily.    ATENOLOL (TENORMIN) 50 MG TABLET    Take 50 mg by mouth daily with breakfast.    CHOLECALCIFEROL (VITAMIN D) 1000 UNITS CAPSULE    Take 1,000 Units by mouth daily.   DILTIAZEM (DILACOR XR) 240 MG 24 HR CAPSULE    Take one tablet by mouth once daily   DULOXETINE (CYMBALTA) 60 MG CAPSULE    Take 60 mg by mouth daily with breakfast.   MEMANTINE (NAMENDA XR) 28 MG CP24 24 HR CAPSULE    Take 28 mg by mouth daily with breakfast.   ONDANSETRON (ZOFRAN) 4 MG TABLET    Take 4 mg by mouth daily with breakfast.    POTASSIUM CHLORIDE SA (K-DUR,KLOR-CON) 20 MEQ TABLET    Take 1 tablet (20 mEq total) by mouth daily.   PROBIOTIC PRODUCT (RESTORA PO)    Take 1 tablet by mouth daily with breakfast.    SUVOREXANT 10 MG TABS    Take 1 tablet by mouth at bedtime as needed (insomnia).  Modified Medications   No medications on file  Discontinued Medications   No medications on file    Physical Exam: Filed Vitals:   08/05/14 1540  BP: 113/73  Pulse: 94  Temp: 97.3 F (36.3 C)  Resp: 19  Height: 5'  5.8" (1.671 m)  Weight: 157 lb 6.4 oz (71.396 kg)  SpO2: 97%   Physical Exam  Constitutional: She appears well-developed and well-nourished. No distress.  Eyes:  glasses  Cardiovascular: Normal heart sounds.   Pulmonary/Chest: Effort normal.  Few scattered rhonchi in right upper lung field  Abdominal: Soft. Bowel sounds are normal.  Musculoskeletal: Normal range of motion.  Mild tenderness of left upper arm  Neurological: She is alert.  Very confused, disoriented to place, does not recognize me    Labs reviewed: Basic Metabolic Panel:  Recent Labs  40/98/11 2144 07/09/14 0525 07/12/14 0738  NA 143 142 138  K 4.3 3.9 3.3*  CL 113* 106 103  CO2 GLUCOSE 109* 120* 114*  BUN 28* 26* 21  CREATININE 0.90 0.75 0.63  CALCIUM 8.9 8.9 8.8     Liver Function Tests:  Recent Labs  10/06/13 07/08/14 2144  AST 12* 17  ALT 12 13  ALKPHOS 109 64  BILITOT  --  0.6  PROT  --  6.7  ALBUMIN  --  3.6    CBC:  Recent Labs  10/06/13 07/08/14 2144 07/09/14 0525  WBC 6.9 12.1* 10.6*  NEUTROABS  --  9.1*  --   HGB 12.3 12.4 11.9*  HCT 36 38.7 37.5  MCV  --  93.5 93.5  PLT 318 204 249   Assessment/Plan 1. Right upper lobe pneumonia - due to persistence of opacity in right upper lobe with possible underlying nodule, will obtain CT chest - CT Chest High Resolution; Future  2. Solitary pulmonary nodule -possibly beneath the RUL infiltrate  3. Alzheimer disease -is progressing; still seems to me that she may have an overlying delirium which is the other reason I want to r/o malignancy.  Family/ staff Communication: spoke with her daughter for 30 minutes in total  Goals of care: remains a full code  Labs/tests ordered:  CT chest high resolution to further assess RUL infiltrate that is unresolving in context of worsening cognition

## 2014-08-05 ENCOUNTER — Encounter: Payer: Self-pay | Admitting: Internal Medicine

## 2014-08-06 ENCOUNTER — Ambulatory Visit (HOSPITAL_COMMUNITY)
Admission: RE | Admit: 2014-08-06 | Discharge: 2014-08-06 | Disposition: A | Discharge status: Home / Self Care | Payer: Medicare Other | Source: Ambulatory Visit | Attending: Internal Medicine | Admitting: Internal Medicine

## 2014-08-06 ENCOUNTER — Encounter (HOSPITAL_COMMUNITY): Payer: Self-pay

## 2014-08-06 DIAGNOSIS — F0280 Dementia in other diseases classified elsewhere without behavioral disturbance: Secondary | ICD-10-CM | POA: Insufficient documentation

## 2014-08-06 DIAGNOSIS — G309 Alzheimer's disease, unspecified: Secondary | ICD-10-CM | POA: Insufficient documentation

## 2014-08-06 DIAGNOSIS — R918 Other nonspecific abnormal finding of lung field: Secondary | ICD-10-CM | POA: Insufficient documentation

## 2014-08-06 DIAGNOSIS — J690 Pneumonitis due to inhalation of food and vomit: Secondary | ICD-10-CM

## 2014-08-06 MED ORDER — IOHEXOL 300 MG/ML  SOLN: 80.0000 mL | Freq: Once | INTRAMUSCULAR | Status: AC | PRN

## 2014-08-09 ENCOUNTER — Telehealth: Payer: Self-pay | Admitting: *Deleted

## 2014-08-09 NOTE — Telephone Encounter (Signed)
Patient daughter, Dewayne Hatch, wants you to call her regarding patient's CT of Chest she had done Friday at [facility]. She wants you to call her at # [phone].

## 2014-08-10 NOTE — Telephone Encounter (Signed)
Made two separate attempts to reach [name] today at 1:15pm and again this evening at 5:45pm--left messages at home and cell numbers.

## 2014-08-12 ENCOUNTER — Other Ambulatory Visit: Payer: Self-pay | Admitting: Internal Medicine

## 2014-08-12 ENCOUNTER — Ambulatory Visit (HOSPITAL_COMMUNITY)
Admission: RE | Admit: 2014-08-12 | Discharge: 2014-08-12 | Disposition: A | Discharge status: Home / Self Care | Payer: Medicare Other | Source: Ambulatory Visit | Attending: Internal Medicine | Admitting: Internal Medicine

## 2014-08-12 ENCOUNTER — Encounter (HOSPITAL_COMMUNITY): Payer: Self-pay

## 2014-08-12 DIAGNOSIS — I2699 Other pulmonary embolism without acute cor pulmonale: Secondary | ICD-10-CM

## 2014-08-12 DIAGNOSIS — I4891 Unspecified atrial fibrillation: Secondary | ICD-10-CM | POA: Diagnosis not present

## 2014-08-12 MED ORDER — IOHEXOL 350 MG/ML SOLN
100.0000 mL | Freq: Once | INTRAVENOUS | Status: AC | PRN
  Administered 2014-08-12: 100 mL via INTRAVENOUS

## 2014-08-24 ENCOUNTER — Institutional Professional Consult (permissible substitution): Payer: Medicare Other | Admitting: Internal Medicine

## 2014-08-25 ENCOUNTER — Non-Acute Institutional Stay: Payer: Medicare Other | Admitting: Nurse Practitioner

## 2014-08-25 ENCOUNTER — Encounter: Payer: Self-pay | Admitting: Nurse Practitioner

## 2014-08-25 VITALS — BP 100/62 | HR 80 | Temp 97.4°F | Wt 152.0 lb

## 2014-08-25 DIAGNOSIS — G309 Alzheimer's disease, unspecified: Secondary | ICD-10-CM | POA: Diagnosis not present

## 2014-08-25 DIAGNOSIS — R634 Abnormal weight loss: Secondary | ICD-10-CM

## 2014-08-25 DIAGNOSIS — I482 Chronic atrial fibrillation, unspecified: Secondary | ICD-10-CM

## 2014-08-25 DIAGNOSIS — R911 Solitary pulmonary nodule: Secondary | ICD-10-CM | POA: Diagnosis not present

## 2014-08-25 DIAGNOSIS — F0280 Dementia in other diseases classified elsewhere without behavioral disturbance: Secondary | ICD-10-CM

## 2014-08-25 NOTE — Progress Notes (Signed)
Patient ID: [name], female   DOB: Feb 18, 1922, 81 y.o.   MRN: [id number]    Nursing Home Location:  [facility] [employer] of Service: Clinic (12)  PCP: [name], [name], DO  Allergies  Allergen Reactions  . Codeine Other (See Comments)    Unknown reaction Per [facility]     Chief Complaint  Patient presents with  . Acute Visit    decline    HPI:  Patient is a 81 y.o. female seen today at [facility] who presents today for follow up.  She has a history of atrial fibrillation, hypertension, aspiration pneumonia, Alzheimer's disease, and insomnia.  She is accompanied by her caregiver today.  She says she is feeling well and has been eating and drinking well.   Her only complaint today is that she has intermittent episodes of sweating and then feeling cold.  This started about 2 weeks ago and usually happens in the morning, but has occurred in the afternoon as well.  No reports of fever from nursing.  Patient cannot connect the sweating and chills with any activity and comes and goes quickly.  Recent CT shows 1. Slightly limited examination secondary to respiratory motion, demonstrating no definite evidence of central, lobar or proximal segmental sized pulmonary embolism. Additionally, despite the limitations of today's study, the area of concern in the right upper lobe is well visualized, and there are no more distal emboli extending to that region. 2. Persistent peripheral wedge-shaped area of mass like airspace consolidation and surrounding ground-glass attenuation in the right upper lobe is very similar to exam from 08/06/2014. This remains concerning for pneumonia, however, the possibility of an infiltrative neoplasm such as adenocarcinoma should be considered if there are no signs or symptoms of pneumonia at this time, and in particular, should this opacity fail to resolve on subsequent serial chest x-rays following antimicrobial therapy.  Clinical correlation is recommended. Dr Renato Gails discussed results with daughter, daughter does not wish for patient to be informed due to her dementia she would not want her put through any extensive testing. Pulmonary referral was cancelled due to this.   Review of Systems:  Review of Systems  Constitutional: Positive for chills. Negative for fever, fatigue and unexpected weight change.  HENT: Negative.   Respiratory: Negative for cough, shortness of breath and wheezing.   Cardiovascular: Negative for chest pain, palpitations and leg swelling.  Gastrointestinal: Negative for abdominal pain, diarrhea and constipation.  Genitourinary: Negative for dysuria, urgency, frequency and flank pain.  Musculoskeletal: Negative for myalgias, back pain and gait problem.  Skin: Negative.   Neurological: Negative for dizziness, light-headedness and headaches.  Psychiatric/Behavioral: Negative for behavioral problems and agitation. The patient is not nervous/anxious.     Past Medical History  Diagnosis Date  . Gastroesophageal reflux disease   . Atrial fibrillation 2013    a. Dx 02/2012, had NSTEMI at that time. Rate control/anticoag.  . Alzheimer's dementia   . Endometrial hyperplasia with atypia 1995  . Personal history of colonic polyps 2000  . Hyperlipidemia   . Hypertension   . Macular degeneration   . Osteoarthritis   . Osteopenia   . NSTEMI (non-ST elevated myocardial infarction) 02/2012    a. Cath: large filling defect apical LAD suggestive of embolus - LM, RCA, LCx OK. Cath complicated by delirium  . Insomnia 11/16/2012  . Coronary embolism     a. 02/2012 with NSTEMI (see above).  . Cardiomyopathy     a. EF 45% by  cath 02/2012, 30% by echocardiogram.   Past Surgical History  Procedure Laterality Date  . Appendectomy  1977  . Abdominal hysterectomy  1995    TAH/BSO  . Left heart catheterization with coronary angiogram N/A 02/19/2012    Procedure: LEFT HEART CATHETERIZATION WITH CORONARY  ANGIOGRAM;  Surgeon: Tonny Bollman, MD;  Location: [facility] CATH LAB;  Service: Cardiovascular;  Laterality: N/A;   Social History:   reports that she quit smoking about 41 years ago. Her smoking use included Cigarettes. She has never used smokeless tobacco. She reports that she drinks about 1.2 oz of alcohol per week. She reports that she does not use illicit drugs.  Family History  Problem Relation Age of Onset  . Heart disease Father     Medications: Patient's Medications  New Prescriptions   No medications on file  Previous Medications   ACETAMINOPHEN (TYLENOL) 325 MG TABLET    Take 650 mg by mouth every 6 (six) hours as needed for mild pain, fever or headache.   ASPIRIN 81 MG TABLET    Take 81 mg by mouth daily.    ATENOLOL (TENORMIN) 50 MG TABLET    Take 50 mg by mouth daily with breakfast.    CHOLECALCIFEROL (VITAMIN D) 1000 UNITS CAPSULE    Take 1,000 Units by mouth daily.   DILTIAZEM (DILACOR XR) 240 MG 24 HR CAPSULE    Take one tablet by mouth once daily   DONEPEZIL (ARICEPT) 10 MG TABLET    Take 10 mg by mouth at bedtime.   DULOXETINE (CYMBALTA) 60 MG CAPSULE    Take 60 mg by mouth daily with breakfast.   MEMANTINE (NAMENDA XR) 28 MG CP24 24 HR CAPSULE    Take 28 mg by mouth daily with breakfast.   ONDANSETRON (ZOFRAN) 4 MG TABLET    Take 4 mg by mouth daily with breakfast.    PROBIOTIC PRODUCT (RESTORA PO)    Take 1 tablet by mouth daily with breakfast.    SUVOREXANT 10 MG TABS    Take 1 tablet by mouth at bedtime as needed (insomnia).  Modified Medications   No medications on file  Discontinued Medications   POTASSIUM CHLORIDE SA (K-DUR,KLOR-CON) 20 MEQ TABLET    Take 1 tablet (20 mEq total) by mouth daily.     Physical Exam: Filed Vitals:   08/25/14 0938  BP: 100/62  Pulse: 80  Temp: 97.4 F (36.3 C)  TempSrc: Oral  Weight: 152 lb (68.947 kg)  SpO2: 99%    Physical Exam  Constitutional: She appears well-developed and well-nourished.  HENT:  Head: Normocephalic  and atraumatic.  Eyes: Pupils are equal, round, and reactive to light.  Neck: Normal range of motion. Neck supple.  Cardiovascular: Normal rate, regular rhythm and normal heart sounds.   Pulmonary/Chest: Effort normal. She has decreased breath sounds in the right upper field and the right middle field.  Abdominal: Soft. Bowel sounds are normal. There is no tenderness.  Musculoskeletal: Normal range of motion.  Lymphadenopathy:    She has no cervical adenopathy.  Neurological: She is alert.  Skin: Skin is warm and dry.    Labs reviewed: Basic Metabolic Panel:  Recent Labs  16/10/96 2144 07/09/14 0525 07/12/14 0738 07/20/14  NA 143 142 138 142  K 4.3 3.9 3.3* 4.1  CL 113* 106 103  --   CO2 --   GLUCOSE 109* 120* 114*  --   BUN 28* 26* 21 17  CREATININE 0.90 0.75 0.63  0.8  CALCIUM 8.9 8.9 8.8  --    Liver Function Tests:  Recent Labs  10/06/13 07/08/14 2144 07/20/14  AST 12* 17 36*  ALT 12 13 31   ALKPHOS 109 64 92  BILITOT  --  0.6  --   PROT  --  6.7  --   ALBUMIN  --  3.6  --    No results for input(s): LIPASE, AMYLASE in the last 8760 hours. No results for input(s): AMMONIA in the last 8760 hours. CBC:  Recent Labs  07/08/14 2144 07/09/14 0525 07/20/14 07/28/14  WBC 12.1* 10.6* 14.4 9.3  NEUTROABS 9.1*  --   --   --   HGB 12.4 11.9* 12.1 12.4  HCT 38.7 37.5 37 37  MCV 93.5 93.5  --   --   PLT 204 249 345 411*   TSH:  Recent Labs  10/20/13 07/09/14 0525  TSH 2.54 1.723   A1C: No results found for: HGBA1C Lipid Panel: No results for input(s): CHOL, HDL, LDLCALC, TRIG, CHOLHDL, LDLDIRECT in the last 8760 hours.  Assessment/Plan 1. Atrial fibrillation  Rate controlled on diltiazem.  No palpations or chest pain.  Continue same. Cbc up to date  2. Alzheimer's disease  Maintained on Aricept and Namenda XR.  No behaviors.  She is alert today and interacts appropriately 3. Weight loss  Patient has lost 5 pounds since 08/04/14.  Can supplement  with Ensure between breakfast and lunch. Instructed caregiver to encourage patient to eat and drink fluids.  May be related to lung mass vs dementia. Pt reports she is not very hungry but eats anyway. No trouble swallowing or pain in mouth or throat. Reports occasional nausea but no vomiting and relates this to when her shoulder hurts. 4. Lung Mass Follow up CT from 08/12/14 shows persistent mass suggestive of pneumonia vs. Neoplasm.  Patient is not having shortness of breath, cough or fever. Daughter does not want pt to know abt mass found due to dementia and she would not want to put her through workup therefore pulmonary referral cancelled. Pt not currently having overt pulmonary symptoms

## 2014-09-13 ENCOUNTER — Encounter: Payer: Self-pay | Admitting: Internal Medicine

## 2014-09-14 ENCOUNTER — Encounter: Payer: Medicare Other | Admitting: Internal Medicine

## 2014-09-16 ENCOUNTER — Institutional Professional Consult (permissible substitution): Payer: Medicare Other | Admitting: Internal Medicine

## 2014-10-04 ENCOUNTER — Encounter: Payer: Self-pay | Admitting: Geriatric Medicine

## 2014-10-12 ENCOUNTER — Non-Acute Institutional Stay: Payer: Medicare Other | Admitting: Internal Medicine

## 2014-10-12 ENCOUNTER — Encounter: Payer: Self-pay | Admitting: Internal Medicine

## 2014-10-12 DIAGNOSIS — I482 Chronic atrial fibrillation, unspecified: Secondary | ICD-10-CM

## 2014-10-12 DIAGNOSIS — R911 Solitary pulmonary nodule: Secondary | ICD-10-CM | POA: Diagnosis not present

## 2014-10-12 DIAGNOSIS — G4700 Insomnia, unspecified: Secondary | ICD-10-CM | POA: Diagnosis not present

## 2014-10-12 DIAGNOSIS — G309 Alzheimer's disease, unspecified: Secondary | ICD-10-CM

## 2014-10-12 DIAGNOSIS — S42212D Unspecified displaced fracture of surgical neck of left humerus, subsequent encounter for fracture with routine healing: Secondary | ICD-10-CM | POA: Diagnosis not present

## 2014-10-12 DIAGNOSIS — K219 Gastro-esophageal reflux disease without esophagitis: Secondary | ICD-10-CM | POA: Diagnosis not present

## 2014-10-12 DIAGNOSIS — R110 Nausea: Secondary | ICD-10-CM

## 2014-10-12 DIAGNOSIS — R634 Abnormal weight loss: Secondary | ICD-10-CM | POA: Diagnosis not present

## 2014-10-12 DIAGNOSIS — F0280 Dementia in other diseases classified elsewhere without behavioral disturbance: Secondary | ICD-10-CM

## 2014-10-12 NOTE — Progress Notes (Signed)
Patient ID: [name], female   DOB: Nov 21, 1921, 81 y.o.   MRN: [id number]  Location:  [facility] Provider:  Oveta Idris L. Mariea Clonts, D.O., C.M.D.  Code Status:  Full code Goals of care: Advanced Directive information Does patient have an advance directive?: Yes, Type of Advance Directive: Living will, Does patient want to make changes to advanced directive?: No - Patient declined   Chief Complaint  Patient presents with  . Medical Management of Chronic Issues  . Acute Visit    diarrhea, declining status, refusing therapy and to leave her room    HPI:  81 yo white female long term resident of AL at [facility] with h/o anorexia, dementia, was seen for an acute visit and med mgt.  She had been started on aricept and namenda for her dementia, but these were held over the weekend due to her family's impression that they were the cause of her changes in behavior and diarrhea.  She has been having diarrhea and was  c diff negative.  She has been refusing to participate in OT or leave her room for meals.    As noted in the past, she's had an abnormal CT after her episode of pneumonia with concerns for malignancy, but her daughters opted not to have her see pulmonary due to concerns that she would not handle bad news well.  I met with Percell Locus, Mrs. Mckimmy' caregiver, who expressed concerns that the resident has not been eating at all the past few days, has been weak, not ambulating at all, coughing with wheezing noted, and refusing her medications.  This am, she convinced her family to call and have her meds held.  She only received her cardiac meds and her aspirin.  She remains nauseous despite her other meds being held.    Upon review of her chart, she has lost 9 lbs since her February hospitalization after her fall and left arm fracture.  She had aspiration pneumonia during her rehab stay, but never fully recuperated and her CT imaging continued to show a right-sided density.    She  is receiving zofran in the morning before breakfast.    She says she is still not sleeping despite belsomra.  She is unable to tell me what keeps her awake.  Review of Systems:  Review of Systems  Constitutional: Positive for weight loss, malaise/fatigue and diaphoresis. Negative for fever and chills.  HENT: Positive for congestion.        Hoarseness  Respiratory: Positive for cough and wheezing. Negative for shortness of breath.   Cardiovascular: Negative for chest pain.  Gastrointestinal: Positive for diarrhea. Negative for abdominal pain, constipation, blood in stool and melena.  Genitourinary: Negative for dysuria.  Musculoskeletal: Positive for joint pain. Negative for falls.       Left shoulder pain with abduction beyond 90 degrees  Skin: Negative for rash.  Neurological: Positive for dizziness and weakness. Negative for loss of consciousness.  Psychiatric/Behavioral: Positive for depression and memory loss. The patient has insomnia.     Medications: Patient's Medications  New Prescriptions   No medications on file  Previous Medications   ACETAMINOPHEN (TYLENOL) 325 MG TABLET    Take 650 mg by mouth every 6 (six) hours as needed for mild pain, fever or headache.   ASPIRIN 81 MG TABLET    Take 81 mg by mouth daily.    ATENOLOL (TENORMIN) 50 MG TABLET    Take 50 mg by mouth daily with breakfast.  CHOLECALCIFEROL (VITAMIN D) 1000 UNITS CAPSULE    Take 1,000 Units by mouth daily.   DILTIAZEM (DILACOR XR) 240 MG 24 HR CAPSULE    Take one tablet by mouth once daily   DONEPEZIL (ARICEPT) 10 MG TABLET    Take 10 mg by mouth at bedtime.   DULOXETINE (CYMBALTA) 60 MG CAPSULE    Take 60 mg by mouth daily with breakfast.   MEMANTINE (NAMENDA XR) 28 MG CP24 24 HR CAPSULE    Take 28 mg by mouth daily with breakfast.   ONDANSETRON (ZOFRAN) 4 MG TABLET    Take 4 mg by mouth daily with breakfast.    PROBIOTIC PRODUCT (RESTORA PO)    Take 1 tablet by mouth daily with breakfast.    SUVOREXANT  10 MG TABS    Take 1 tablet by mouth at bedtime as needed (insomnia).  Modified Medications   No medications on file  Discontinued Medications   No medications on file    Physical Exam: Filed Vitals:   10/12/14 1242  BP: 135/81  Pulse: 65  Temp: 98.4 F (36.9 C)  Resp: 18  Height: 5' 5.8" (1.671 m)  Weight: 150 lb 14.4 oz (68.448 kg)  SpO2: 93%   Physical Exam  Constitutional: She appears well-developed and well-nourished. No distress.  HENT:  Head: Normocephalic and atraumatic.  Mouth/Throat: Oropharynx is clear and moist. No oropharyngeal exudate.  hoarse  Cardiovascular: Intact distal pulses.   irreg irreg  Pulmonary/Chest: Effort normal.  Some audible wheezing sounds during visit; coarse rhonchi entire right lung anteriorly and posteriorly  Musculoskeletal: She exhibits tenderness.  Of left upper arm, is able to abduct beyond 90 but experiences pain  Neurological: She is alert.  Oriented to person and place, not time  Skin: Skin is warm and dry.  Psychiatric:  Poor short term memory    Labs reviewed: Basic Metabolic Panel:  Recent Labs  07/08/14 2144 07/09/14 0525 07/12/14 0738 07/20/14  NA 143 142 138 142  K 4.3 3.9 3.3* 4.1  CL 113* 106 103  --   CO2 21 28 25   --   GLUCOSE 109* 120* 114*  --   BUN 28* 26* 21 17  CREATININE 0.90 0.75 0.63 0.8  CALCIUM 8.9 8.9 8.8  --     Liver Function Tests:  Recent Labs  07/08/14 2144 07/20/14  AST 17 36*  ALT 13 31  ALKPHOS 64 92  BILITOT 0.6  --   PROT 6.7  --   ALBUMIN 3.6  --     CBC:  Recent Labs  07/08/14 2144 07/09/14 0525 07/20/14 07/28/14  WBC 12.1* 10.6* 14.4 9.3  NEUTROABS 9.1*  --   --   --   HGB 12.4 11.9* 12.1 12.4  HCT 38.7 37.5 37 37  MCV 93.5 93.5  --   --   PLT 204 249 345 411*     Assessment/Plan 1. Nausea -etiology not entirely clear, also had some diarrhea so all meds were held this am except her cardiac meds and aspirin, but pt remained nauseous -had negative c diff  test over the weekend for the loose stools--she says they were not overt diarrhea -she is on zofran once a day before breakfast which was increased to bid before breakfast and supper today  2. Loss of weight -seems to be a mixture of her progressing dementia, depression, anorexia (has history of this per record), and possible lung mass -I have ordered portable chest xrays pa and lateral to  reassess her lungs as her right lung remains rhonchorous throughout, she continues to cough, has lost appetite -see #4  3. Alzheimer disease -her memory loss is progressively worsening -due to diarrhea, I did d/c aricept altogether, but namenda should not cause this so I opted to continue it--it may actually help with her resistance to care/therapy/leaving her room b/c it's anecdotally been noted to help with some of this  4. Solitary pulmonary nodule -suspect this may be cancer based on her other symptoms, 9# wt loss, weakness, fatigue, nausea, anorexia, overall decline -CT chest is still ordered for follow up, but previously her family had not wanted this so plan is to look at xray first and make a decision about the CT from there--her daughters seem to want black and white answers, but I cannot give them without the pulmonary consultation   5. Chronic atrial fibrillation -cont diltiazem and atenolol for rate control, baby aspirin only for stroke prevention  6. Humeral surgical neck fracture, left, with routine healing, subsequent encounter -is to be doing OT to prevent frozen shoulder--had pretty good ROM when I saw her, but does still have discomfort with abduction over 90 and flexion over 90 degrees  7. Gastroesophageal reflux disease without esophagitis -we made need to resume a PPI--this could be a contributor to nausea  8.  Insomnia:  Stopped belsomra due to lack of benefit per resident--must be active in days to sleep well at night  Family/ staff Communication: spoke with Percell Locus, dayshift nurse,  nurse manager about plan and will communicate with director of health care, as well  Labs/tests ordered:  Chest xray pa and lateral due to persistent rhonchi, wt loss, possible mass on right side  Marcelo Ickes L. Brookes Craine, D.O. [facility] [address], [place] [address] Cell Phone (Mon-Fri 8am-5pm):  [phone] On Call:  [phone] & follow prompts after 5pm & weekends Office Phone:  [phone] Office Fax:  [phone]

## 2014-10-13 ENCOUNTER — Encounter: Payer: Self-pay | Admitting: Internal Medicine

## 2014-10-13 ENCOUNTER — Telehealth: Payer: Self-pay | Admitting: Internal Medicine

## 2014-10-13 NOTE — Telephone Encounter (Signed)
Director of Health Care and myself spoke with [name] by phone this morning to review my visit with Mrs. [name] yesterday.  18 minutes were spent discussing goals of care with her and reviewing pt's current condition.  It is my impression that she has a right-sided lung cancer with a small pleural effusion as seen on the portable chest xray done yesterday.  [name] does not want this to be pursued with further imaging, biopsy or second opinion.  She wants her mother to be kept as comfortable as possible.  She is not interested in hospice at this time and really does not want her mother to know that she may have cancer and that her prognosis is likely less than 6 months.  We reviewed the changes made to her medication regimen.

## 2014-11-03 ENCOUNTER — Non-Acute Institutional Stay: Payer: Medicare Other | Admitting: Internal Medicine

## 2014-11-03 DIAGNOSIS — R110 Nausea: Secondary | ICD-10-CM

## 2014-11-03 DIAGNOSIS — R634 Abnormal weight loss: Secondary | ICD-10-CM

## 2014-11-03 DIAGNOSIS — S42212D Unspecified displaced fracture of surgical neck of left humerus, subsequent encounter for fracture with routine healing: Secondary | ICD-10-CM

## 2014-11-03 DIAGNOSIS — G309 Alzheimer's disease, unspecified: Secondary | ICD-10-CM

## 2014-11-03 DIAGNOSIS — F0280 Dementia in other diseases classified elsewhere without behavioral disturbance: Secondary | ICD-10-CM

## 2014-11-03 DIAGNOSIS — R911 Solitary pulmonary nodule: Secondary | ICD-10-CM | POA: Diagnosis not present

## 2014-11-09 ENCOUNTER — Encounter: Payer: Self-pay | Admitting: Internal Medicine

## 2014-11-09 MED ORDER — MELATONIN 10 MG PO CAPS: 1.0000 | ORAL_CAPSULE | Freq: Every day | ORAL | Status: AC

## 2014-11-09 NOTE — Progress Notes (Signed)
This encounter was created in error - please disregard.

## 2014-11-09 NOTE — Progress Notes (Signed)
Patient ID: [name], female   DOB: 07-04-1921, 81 y.o.   MRN: [id number]  Location:  [facility] Provider:  Gwenith Osborn. [name], D.O., C.M.D. Date of Service:  11/03/14  Code Status:  DNR Goals of care: Advanced Directive information Does patient have an advance directive?: Yes, Type of Advance Directive: Living will, Does patient want to make changes to advanced directive?: No - Patient declined  Chief Complaint  Patient presents with  . Acute Visit    f/u of nausea, weakness, weight loss, dementia, lung mass  . Medical Management of Chronic Issues    HPI:  81 yo female seen for acute visit re: weight loss and lung mass and for med mgt chronic diseases.  Unfortunately, she has continued to lose weight.  Her appetite is variable meal to meal and day to day.  Sometimes, she c/o nausea, other times she doesn't.  She sometimes requests other food items if she does not like what she is given.  She continues to spend much of her days in the bed.  She has not been doing therapy.  She still feels weak.  Her left shoulder and upper arm continue to hurt at times (bothering her today).  Her short term memory seemed much worse to me today--I had to introduce myself three times.  Her biggest complaint remains that she cannot sleep at night.  Discussed that it is important to be up in the daytime to sleep at night, but she does not remember that she is sleeping much of the day away on a pretty regular basis.  Her caregiver sometimes will take her out, but her regular caregiver, [name], has been out after surgery.    The director of health care and myself had a 30 minute conversation with [name]' daughters, [name] and [name].  The conclusions were the same: continue to keep their mother comfortable, do not tell her up front about possible lung cancer, no further workup which would involve biopsy at this point.  We discussed that treating this w/o a tissue diagnosis was not possible nor something I'd be  comfortable doing.  I informed them of the med changes to increase melatonin to 10mg  and add am scheduled tylenol for her shoulder pain.  All questions were answered and all of [place] were in agreement on the plan of care.    Review of Systems:  Review of Systems  Constitutional: Positive for weight loss and malaise/fatigue. Negative for fever and chills.  HENT: Negative for congestion and tinnitus.   Eyes: Negative for blurred vision.  Respiratory: Negative for shortness of breath.   Cardiovascular: Negative for chest pain and leg swelling.  Gastrointestinal: Negative for abdominal pain, constipation, blood in stool and melena.  Genitourinary: Negative for dysuria.  Musculoskeletal: Negative for falls.  Neurological: Positive for dizziness and weakness. Negative for loss of consciousness and headaches.  Psychiatric/Behavioral: Positive for depression and memory loss. The patient has insomnia.     Past Medical History  Diagnosis Date  . Gastroesophageal reflux disease   . Atrial fibrillation 2013    a. Dx 02/2012, had NSTEMI at that time. Rate control/anticoag.  . Alzheimer's dementia   . Endometrial hyperplasia with atypia 1995  . Personal history of colonic polyps 2000  . Hyperlipidemia   . Hypertension   . Macular degeneration   . Osteoarthritis   . Osteopenia   . NSTEMI (non-ST elevated myocardial infarction) 02/2012    a. Cath: large filling defect apical LAD suggestive of  embolus - LM, RCA, LCx OK. Cath complicated by delirium  . Insomnia 11/16/2012  . Coronary embolism     a. 02/2012 with NSTEMI (see above).  . Cardiomyopathy     a. EF 45% by cath 02/2012, 30% by echocardiogram.    Patient Active Problem List   Diagnosis Date Noted  . Acute encephalopathy 07/09/2014  . Fracture of neck of left humerus 07/09/2014  . HCAP (healthcare-associated pneumonia) 07/08/2014  . Humeral surgical neck fracture 07/07/2014  . Constipation due to pain medication 07/07/2014  . Senile  osteoporosis 07/07/2014  . Depression 10/22/2013  . Muscle weakness (generalized) 10/19/2013  . Loss of weight 10/19/2013  . Nausea without vomiting 10/05/2013  . Coronary embolism   . Cardiomyopathy   . Generalized anxiety disorder 07/15/2013  . Anorexia nervosa 12/22/2012  . Insomnia 11/16/2012  . UTI (urinary tract infection) 11/10/2012  . Alzheimer disease   . Hypertension   . Gastroesophageal reflux disease   . Atrial fibrillation 02/18/2012  . NSTEMI (non-ST elevated myocardial infarction) 02/10/2012    Allergies  Allergen Reactions  . Codeine Other (See Comments)    Unknown reaction Per MAR     Medications: Patient's Medications  New Prescriptions   MELATONIN 10 MG CAPS    Take 1 capsule by mouth at bedtime.  Previous Medications   ACETAMINOPHEN (TYLENOL) 325 MG TABLET    Take 650 mg by mouth daily.   ASPIRIN 81 MG TABLET    Take 81 mg by mouth daily.    ATENOLOL (TENORMIN) 50 MG TABLET    Take 50 mg by mouth daily with breakfast.    CHOLECALCIFEROL (VITAMIN D) 1000 UNITS CAPSULE    Take 1,000 Units by mouth daily.   DILTIAZEM (DILACOR XR) 240 MG 24 HR CAPSULE    Take one tablet by mouth once daily   MEMANTINE (NAMENDA XR) 28 MG CP24 24 HR CAPSULE    Take 28 mg by mouth daily with breakfast.   ONDANSETRON (ZOFRAN) 4 MG TABLET    Take 4 mg by mouth 2 (two) times daily with a meal.  Modified Medications   No medications on file  Discontinued Medications   No medications on file    Physical Exam: Filed Vitals:   11/03/14 1750  BP: 114/68  Pulse: 65  Temp: 97.8 F (36.6 C)  Resp: 20  Height: 5' 5.8" (1.671 m)  Weight: 148 lb (67.132 kg)  SpO2: 100%   Body mass index is 24.04 kg/(m^2).  Physical Exam  Constitutional: She appears well-developed and well-nourished. No distress.  Resting in bed   Cardiovascular: Normal rate, regular rhythm, normal heart sounds and intact distal pulses.   Pulmonary/Chest: Effort normal. No respiratory distress.  Diminished  breath sounds upper lung field  Abdominal: Soft. Bowel sounds are normal. She exhibits no distension and no mass. There is no tenderness.  Musculoskeletal: Normal range of motion. She exhibits tenderness.  Left shoulder and upper arm  Neurological: She is alert.  Skin: Skin is warm and dry. There is pallor.  Psychiatric:  Agitated today    Labs reviewed: Basic Metabolic Panel:  Recent Labs  57/84/69 2144 07/09/14 0525 07/12/14 0738 07/20/14  NA 143 142 138 142  K 4.3 3.9 3.3* 4.1  CL 113* 106 103  --   CO2 --   GLUCOSE 109* 120* 114*  --   BUN 28* 26* 21 17  CREATININE 0.90 0.75 0.63 0.8  CALCIUM 8.9 8.9 8.8  --  Liver Function Tests:  Recent Labs  07/08/14 2144 07/20/14  AST 17 36*  ALT 13 31  ALKPHOS 64 92  BILITOT 0.6  --   PROT 6.7  --   ALBUMIN 3.6  --     CBC:  Recent Labs  07/08/14 2144 07/09/14 0525 07/20/14 07/28/14  WBC 12.1* 10.6* 14.4 9.3  NEUTROABS 9.1*  --   --   --   HGB 12.4 11.9* 12.1 12.4  HCT 38.7 37.5 37 37  MCV 93.5 93.5  --   --   PLT 204 249 345 411*    Lab Results  Component Value Date   TSH 1.723 07/09/2014   No results found for: HGBA1C Lab Results  Component Value Date   CHOL 153 02/19/2012   HDL 42 02/19/2012   LDLCALC 96 02/19/2012   TRIG 75 02/19/2012   CHOLHDL 3.6 02/19/2012    Significant Diagnostic Results since last visit:  CXR:  Persistent lung opacity present  Patient Care Team: Kermit Balo, DO as PCP - General (Geriatric Medicine) Lewayne Bunting, MD as Consulting Physician (Cardiology) [facility]  Assessment/Plan 1. Nausea -intermittent--seems like this might be better with as needed zofran  2. Loss of weight -continues, suspect this is due to lung malignancy  3. Alzheimer disease -also is progressing with worsening short term memory--has good and bad days--recalls important events in her life with special meaning to her which is typical -cont namenda  XR  4. Solitary pulmonary nodule -cont to monitor weights, keep her comfortable, no biopsy or further referrals  5. Humeral surgical neck fracture, left, with routine healing, subsequent encounter -due to increased pain (likely from position and spending too much time in bed), will schedule am tylenol  Family/ staff Communication: discussed with director of health care, pt's two daughters as above and floor nurse  Labs/tests ordered:  No new labs  Terrye Dombrosky L. Dorothie Wah, D.O. Geriatrics [facility] [address], [place] [address] Cell Phone (Mon-Fri 8am-5pm):  [phone] On Call:  [phone] & follow prompts after 5pm & weekends Office Phone:  [phone] Office Fax:  [phone]

## 2014-11-23 ENCOUNTER — Encounter: Payer: Self-pay | Admitting: Internal Medicine

## 2014-12-15 ENCOUNTER — Emergency Department (HOSPITAL_COMMUNITY): Payer: Medicare Other

## 2014-12-15 ENCOUNTER — Encounter (HOSPITAL_COMMUNITY): Payer: Self-pay

## 2014-12-15 ENCOUNTER — Inpatient Hospital Stay (HOSPITAL_COMMUNITY)
Admission: EM | Admit: 2014-12-15 | Discharge: 2014-12-17 | DRG: 194 | Payer: Medicare Other | Attending: Internal Medicine | Admitting: Internal Medicine

## 2014-12-15 DIAGNOSIS — M8580 Other specified disorders of bone density and structure, unspecified site: Secondary | ICD-10-CM | POA: Diagnosis present

## 2014-12-15 DIAGNOSIS — F0280 Dementia in other diseases classified elsewhere without behavioral disturbance: Secondary | ICD-10-CM | POA: Diagnosis present

## 2014-12-15 DIAGNOSIS — I482 Chronic atrial fibrillation: Secondary | ICD-10-CM

## 2014-12-15 DIAGNOSIS — R0602 Shortness of breath: Secondary | ICD-10-CM | POA: Diagnosis present

## 2014-12-15 DIAGNOSIS — I2510 Atherosclerotic heart disease of native coronary artery without angina pectoris: Secondary | ICD-10-CM | POA: Diagnosis present

## 2014-12-15 DIAGNOSIS — M1990 Unspecified osteoarthritis, unspecified site: Secondary | ICD-10-CM | POA: Diagnosis present

## 2014-12-15 DIAGNOSIS — Z66 Do not resuscitate: Secondary | ICD-10-CM | POA: Diagnosis present

## 2014-12-15 DIAGNOSIS — J189 Pneumonia, unspecified organism: Principal | ICD-10-CM | POA: Diagnosis present

## 2014-12-15 DIAGNOSIS — Z9049 Acquired absence of other specified parts of digestive tract: Secondary | ICD-10-CM | POA: Diagnosis present

## 2014-12-15 DIAGNOSIS — I480 Paroxysmal atrial fibrillation: Secondary | ICD-10-CM | POA: Diagnosis present

## 2014-12-15 DIAGNOSIS — G309 Alzheimer's disease, unspecified: Secondary | ICD-10-CM | POA: Diagnosis not present

## 2014-12-15 DIAGNOSIS — Z87891 Personal history of nicotine dependence: Secondary | ICD-10-CM | POA: Diagnosis not present

## 2014-12-15 DIAGNOSIS — H3530 Unspecified macular degeneration: Secondary | ICD-10-CM | POA: Diagnosis present

## 2014-12-15 DIAGNOSIS — Z885 Allergy status to narcotic agent status: Secondary | ICD-10-CM

## 2014-12-15 DIAGNOSIS — I10 Essential (primary) hypertension: Secondary | ICD-10-CM | POA: Diagnosis present

## 2014-12-15 DIAGNOSIS — R918 Other nonspecific abnormal finding of lung field: Secondary | ICD-10-CM | POA: Diagnosis present

## 2014-12-15 DIAGNOSIS — K219 Gastro-esophageal reflux disease without esophagitis: Secondary | ICD-10-CM | POA: Diagnosis present

## 2014-12-15 DIAGNOSIS — G4700 Insomnia, unspecified: Secondary | ICD-10-CM | POA: Diagnosis present

## 2014-12-15 DIAGNOSIS — E785 Hyperlipidemia, unspecified: Secondary | ICD-10-CM | POA: Diagnosis present

## 2014-12-15 DIAGNOSIS — Z90710 Acquired absence of both cervix and uterus: Secondary | ICD-10-CM

## 2014-12-15 DIAGNOSIS — I252 Old myocardial infarction: Secondary | ICD-10-CM | POA: Diagnosis not present

## 2014-12-15 DIAGNOSIS — I429 Cardiomyopathy, unspecified: Secondary | ICD-10-CM | POA: Diagnosis present

## 2014-12-15 DIAGNOSIS — I4891 Unspecified atrial fibrillation: Secondary | ICD-10-CM | POA: Diagnosis present

## 2014-12-15 DIAGNOSIS — Z86010 Personal history of colonic polyps: Secondary | ICD-10-CM

## 2014-12-15 DIAGNOSIS — N390 Urinary tract infection, site not specified: Secondary | ICD-10-CM

## 2014-12-15 LAB — BASIC METABOLIC PANEL
Anion gap: 11 (ref 5–15)
BUN: 17 mg/dL (ref 6–20)
CO2: 25 mmol/L (ref 22–32)
Calcium: 9.2 mg/dL (ref 8.9–10.3)
Chloride: 101 mmol/L (ref 101–111)
Creatinine, Ser: 0.9 mg/dL (ref 0.44–1.00)
GFR calc Af Amer: >60 mL/min (ref >60)
GFR calc non Af Amer: 54 mL/min — ABNORMAL LOW (ref >60)
Glucose, Bld: 130 mg/dL — ABNORMAL HIGH (ref 65–99)
Potassium: 3.8 mmol/L (ref 3.5–5.1)
Sodium: 137 mmol/L (ref 135–145)

## 2014-12-15 LAB — CBC WITH DIFFERENTIAL/PLATELET
BASOS ABS: 0 10*3/uL (ref 0.0–0.1)
Basophils Relative: 0 % (ref 0–1)
Eosinophils Absolute: 0 10*3/uL (ref 0.0–0.7)
Eosinophils Relative: 0 % (ref 0–5)
HEMATOCRIT: 41.2 % (ref 36.0–46.0)
Hemoglobin: 13.6 g/dL (ref 12.0–15.0)
LYMPHS PCT: 4 % — AB (ref 12–46)
Lymphs Abs: 0.7 10*3/uL (ref 0.7–4.0)
MCH: 30.2 pg (ref 26.0–34.0)
MCHC: 33 g/dL (ref 30.0–36.0)
MCV: 91.4 fL (ref 78.0–100.0)
Monocytes Absolute: 1.1 10*3/uL — ABNORMAL HIGH (ref 0.1–1.0)
Monocytes Relative: 6 % (ref 3–12)
NEUTROS ABS: 18.6 10*3/uL — AB (ref 1.7–7.7)
NEUTROS PCT: 90 % — AB (ref 43–77)
Platelets: 226 10*3/uL (ref 150–400)
RBC: 4.51 MIL/uL (ref 3.87–5.11)
RDW: 13.7 % (ref 11.5–15.5)
WBC: 20.5 10*3/uL — AB (ref 4.0–10.5)

## 2014-12-15 LAB — LACTIC ACID, PLASMA: LACTIC ACID, VENOUS: 2 mmol/L (ref 0.5–2.0)

## 2014-12-15 LAB — PROTIME-INR
INR: 1.1 (ref 0.00–1.49)
Prothrombin Time: 14.4 seconds (ref 11.6–15.2)

## 2014-12-15 LAB — URINALYSIS, ROUTINE W REFLEX MICROSCOPIC
Bilirubin Urine: NEGATIVE
Glucose, UA: NEGATIVE mg/dL
Hgb urine dipstick: NEGATIVE
Ketones, ur: NEGATIVE mg/dL
Nitrite: POSITIVE — AB
Protein, ur: NEGATIVE mg/dL
Specific Gravity, Urine: 1.02 (ref 1.005–1.030)
Urobilinogen, UA: 0.2 mg/dL (ref 0.0–1.0)
pH: 6 (ref 5.0–8.0)

## 2014-12-15 LAB — URINE MICROSCOPIC-ADD ON

## 2014-12-15 LAB — APTT: aPTT: 29 seconds (ref 24–37)

## 2014-12-15 LAB — PROCALCITONIN: Procalcitonin: 0.19 ng/mL

## 2014-12-15 MED ORDER — DILTIAZEM HCL 60 MG PO TABS
60.0000 mg | ORAL_TABLET | Freq: Four times a day (QID) | ORAL | Status: DC
  Administered 2014-12-15 – 2014-12-17 (×7): 60 mg via ORAL
  Filled 2014-12-15 (×9): qty 1

## 2014-12-15 MED ORDER — IPRATROPIUM BROMIDE 0.02 % IN SOLN
0.5000 mg | Freq: Once | RESPIRATORY_TRACT | Status: AC
  Administered 2014-12-15: 0.5 mg via RESPIRATORY_TRACT
  Filled 2014-12-15: qty 2.5

## 2014-12-15 MED ORDER — PIPERACILLIN-TAZOBACTAM 3.375 G IVPB 30 MIN: 3.3750 g | Freq: Three times a day (TID) | INTRAVENOUS | Status: DC

## 2014-12-15 MED ORDER — VANCOMYCIN HCL IN DEXTROSE 1-5 GM/200ML-% IV SOLN
1000.0000 mg | INTRAVENOUS | Status: DC
  Administered 2014-12-16: 1000 mg via INTRAVENOUS
  Filled 2014-12-15 (×2): qty 200

## 2014-12-15 MED ORDER — ALBUTEROL SULFATE (2.5 MG/3ML) 0.083% IN NEBU
5.0000 mg | INHALATION_SOLUTION | Freq: Once | RESPIRATORY_TRACT | Status: AC
  Administered 2014-12-15: 5 mg via RESPIRATORY_TRACT
  Filled 2014-12-15: qty 6

## 2014-12-15 MED ORDER — BUPROPION HCL 75 MG PO TABS
75.0000 mg | ORAL_TABLET | Freq: Every morning | ORAL | Status: DC
  Administered 2014-12-16 – 2014-12-17 (×2): 75 mg via ORAL
  Filled 2014-12-15 (×2): qty 1

## 2014-12-15 MED ORDER — ATENOLOL 50 MG PO TABS
50.0000 mg | ORAL_TABLET | Freq: Every day | ORAL | Status: DC
Start: 2014-12-16 — End: 2014-12-17
  Administered 2014-12-16 – 2014-12-17 (×2): 50 mg via ORAL
  Filled 2014-12-15 (×3): qty 1

## 2014-12-15 MED ORDER — MEMANTINE HCL ER 28 MG PO CP24
28.0000 mg | ORAL_CAPSULE | Freq: Every day | ORAL | Status: DC
  Administered 2014-12-16 – 2014-12-17 (×2): 28 mg via ORAL
  Filled 2014-12-15 (×3): qty 1

## 2014-12-15 MED ORDER — PIPERACILLIN-TAZOBACTAM 3.375 G IVPB 30 MIN
3.3750 g | Freq: Once | INTRAVENOUS | Status: DC
Start: 2014-12-15 — End: 2014-12-15

## 2014-12-15 MED ORDER — VANCOMYCIN HCL IN DEXTROSE 1-5 GM/200ML-% IV SOLN
1000.0000 mg | Freq: Once | INTRAVENOUS | Status: AC
  Administered 2014-12-15: 1000 mg via INTRAVENOUS
  Filled 2014-12-15: qty 200

## 2014-12-15 MED ORDER — ENSURE ENLIVE PO LIQD
237.0000 mL | Freq: Every morning | ORAL | Status: DC
  Administered 2014-12-16 – 2014-12-17 (×2): 237 mL via ORAL

## 2014-12-15 MED ORDER — ACETAMINOPHEN 325 MG PO TABS
650.0000 mg | ORAL_TABLET | Freq: Every day | ORAL | Status: DC
  Administered 2014-12-15 – 2014-12-17 (×3): 650 mg via ORAL
  Filled 2014-12-15 (×3): qty 2

## 2014-12-15 MED ORDER — ASPIRIN EC 81 MG PO TBEC
81.0000 mg | DELAYED_RELEASE_TABLET | Freq: Every day | ORAL | Status: DC
  Administered 2014-12-16 – 2014-12-17 (×2): 81 mg via ORAL
  Filled 2014-12-15 (×2): qty 1

## 2014-12-15 MED ORDER — ENOXAPARIN SODIUM 40 MG/0.4ML ~~LOC~~ SOLN
40.0000 mg | SUBCUTANEOUS | Status: DC
  Administered 2014-12-16: 40 mg via SUBCUTANEOUS
  Filled 2014-12-15 (×3): qty 0.4

## 2014-12-15 MED ORDER — SODIUM CHLORIDE 0.9 % IV SOLN
INTRAVENOUS | Status: DC
  Administered 2014-12-15 – 2014-12-17 (×2): via INTRAVENOUS

## 2014-12-15 MED ORDER — PIPERACILLIN-TAZOBACTAM 3.375 G IVPB
3.3750 g | Freq: Three times a day (TID) | INTRAVENOUS | Status: DC
Start: 2014-12-16 — End: 2014-12-15
  Filled 2014-12-15: qty 50

## 2014-12-15 MED ORDER — PIPERACILLIN-TAZOBACTAM 3.375 G IVPB 30 MIN
3.3750 g | Freq: Three times a day (TID) | INTRAVENOUS | Status: DC
  Filled 2014-12-15: qty 50

## 2014-12-15 MED ORDER — PIPERACILLIN-TAZOBACTAM 3.375 G IVPB
3.3750 g | Freq: Three times a day (TID) | INTRAVENOUS | Status: DC
  Administered 2014-12-15 – 2014-12-17 (×5): 3.375 g via INTRAVENOUS
  Filled 2014-12-15 (×7): qty 50

## 2014-12-15 NOTE — ED Provider Notes (Signed)
CSN: [id number]     Arrival date & time 12/15/14  1741 History   First MD Initiated Contact with Patient 12/15/14 1820     Chief Complaint  Patient presents with  . Cough     (Consider location/radiation/quality/duration/timing/severity/associated sxs/prior Treatment) HPI  LEVEL V CAVEAT- Alzheimer   PCP: [name], Collette Pescador, DO Blood pressure 147/78, pulse 93, temperature 100.1 F (37.8 C), temperature source Rectal, resp. rate 27, SpO2 95 %.  [name] is a 81 y.o.female with a significant PMH of GERD, atrial fibrillation, Alzheimer's disease, endometrial hyperplasia, hypertension, arthritis, insomnia, coronary embolism, cardiomyopathy.   The patient is brought to the ER from [facility] with complaints of hypoxia, cough, fever, anorexia, weakness. She has a known mass to the right upper lung but the family prefers that she does not know about this due to the fact that she would be really worried about a possible metastasis. The facility and the patients daughter (who is present) prefers that the patient stay in the hospital overnight. They do not want anything done for the lung mass.    On arrival the patient is febrile, tachypnic, and hypoxic on room air at 88%. Pt awake and does not appear to be in acute distress.  Past Medical History  Diagnosis Date  . Gastroesophageal reflux disease   . Atrial fibrillation 2013    a. Dx 02/2012, had NSTEMI at that time. Rate control/anticoag.  . Alzheimer's dementia   . Endometrial hyperplasia with atypia 1995  . Personal history of colonic polyps 2000  . Hyperlipidemia   . Hypertension   . Macular degeneration   . Osteoarthritis   . Osteopenia   . NSTEMI (non-ST elevated myocardial infarction) 02/2012    a. Cath: large filling defect apical LAD suggestive of embolus - LM, RCA, LCx OK. Cath complicated by delirium  . Insomnia 11/16/2012  . Coronary embolism     a. 02/2012 with NSTEMI (see above).  . Cardiomyopathy     a. EF 45% by cath  02/2012, 30% by echocardiogram.   Past Surgical History  Procedure Laterality Date  . Appendectomy  1977  . Abdominal hysterectomy  1995    TAH/BSO  . Left heart catheterization with coronary angiogram N/A 02/19/2012    Procedure: LEFT HEART CATHETERIZATION WITH CORONARY ANGIOGRAM;  Surgeon: Tonny Bollman, MD;  Location: [facility] CATH LAB;  Service: Cardiovascular;  Laterality: N/A;   Family History  Problem Relation Age of Onset  . Heart disease Father    History  Substance Use Topics  . Smoking status: Former Smoker    Types: Cigarettes    Quit date: 11/28/1972  . Smokeless tobacco: Never Used  . Alcohol Use: 1.2 oz/week    2 Glasses of wine per week   OB History    No data available     Review of Systems  LEVEL V CAVEAT- Alzheimer  Allergies  Codeine  Home Medications   Prior to Admission medications   Medication Sig Start Date End Date Taking? Authorizing Provider  acetaminophen (TYLENOL) 325 MG tablet Take 650 mg by mouth daily. 11/03/14  Yes Historical Provider, MD  aspirin 81 MG tablet Take 81 mg by mouth daily.  06/16/13  Yes Claudette Royston Sinner, NP  atenolol (TENORMIN) 50 MG tablet Take 50 mg by mouth daily with breakfast.    Yes Historical Provider, MD  buPROPion (WELLBUTRIN) 75 MG tablet Take 75 mg by mouth every morning.   Yes Historical Provider, MD  Cholecalciferol (VITAMIN D) 1000 UNITS capsule  Take 1,000 Units by mouth daily.   Yes Historical Provider, MD  diltiazem (DILACOR XR) 240 MG 24 hr capsule Take one tablet by mouth once daily 08/05/13  Yes Sharon Seller, NP  feeding supplement, ENSURE ENLIVE, (ENSURE ENLIVE) LIQD Take 237 mLs by mouth every morning.   Yes Historical Provider, MD  Melatonin 10 MG CAPS Take 1 capsule by mouth at bedtime. 11/09/14  Yes Patrick Salemi L [name], DO  memantine (NAMENDA XR) 28 MG CP24 24 hr capsule Take 28 mg by mouth daily with breakfast.   Yes Historical Provider, MD  Multiple Vitamins-Minerals (PRESERVISION AREDS 2 PO) Take 1 capsule  by mouth 2 (two) times daily.   Yes Historical Provider, MD  ondansetron (ZOFRAN) 4 MG tablet Take 4 mg by mouth 2 (two) times daily with a meal.   Yes Historical Provider, MD   BP 147/78 mmHg  Pulse 93  Temp(Src) 100.1 F (37.8 C) (Rectal)  Resp 27  SpO2 95% Physical Exam  Constitutional: She appears well-developed and well-nourished. No distress.  HENT:  Head: Normocephalic and atraumatic.  Right Ear: Tympanic membrane normal.  Left Ear: Tympanic membrane normal.  Nose: Nose normal.  Eyes: Pupils are equal, round, and reactive to light.  Neck: Normal range of motion. Neck supple.  Cardiovascular: Normal rate and regular rhythm.   Pulmonary/Chest: Effort normal. No accessory muscle usage. No respiratory distress. She has no decreased breath sounds. She has wheezes (mild bilateral wheezes). She has rhonchi in the right upper field.  Abdominal: Soft.  Musculoskeletal:  No LE swelling  Neurological: She is alert.  Skin: Skin is warm and dry.  Nursing note and vitals reviewed.   ED Course  Procedures (including critical care time) Labs Review Labs Reviewed  URINALYSIS, ROUTINE W REFLEX MICROSCOPIC (NOT AT [facility]) - Abnormal; Notable for the following:    APPearance CLOUDY (*)    Nitrite POSITIVE (*)    Leukocytes, UA TRACE (*)    All other components within normal limits  CBC WITH DIFFERENTIAL/PLATELET - Abnormal; Notable for the following:    WBC 20.5 (*)    Neutrophils Relative % 90 (*)    Neutro Abs 18.6 (*)    Lymphocytes Relative 4 (*)    Monocytes Absolute 1.1 (*)    All other components within normal limits  BASIC METABOLIC PANEL - Abnormal; Notable for the following:    Glucose, Bld 130 (*)    GFR calc non Af Amer 54 (*)    All other components within normal limits  URINE MICROSCOPIC-ADD ON - Abnormal; Notable for the following:    Bacteria, UA MANY (*)    All other components within normal limits  CULTURE, EXPECTORATED SPUTUM-ASSESSMENT  GRAM STAIN  CULTURE,  BLOOD (ROUTINE X 2)  CULTURE, BLOOD (ROUTINE X 2)  LEGIONELLA ANTIGEN, URINE  STREP PNEUMONIAE URINARY ANTIGEN  LACTIC ACID, PLASMA  PROCALCITONIN  PROTIME-INR  APTT    Imaging Review Dg Chest 2 View (if Patient Has Fever And/or Copd)  12/15/2014   CLINICAL DATA:  Low grade cough and fever for 2 days.  EXAM: CHEST  2 VIEW  COMPARISON:  CT chest 08/12/2014.  PA and lateral chest 07/08/2014.  FINDINGS: Peripheral opacity in the right upper lobe is again identified and appears more dense than on the prior plain films. The left lung appears clear. Heart size is mildly enlarged. No pneumothorax or pleural effusion is identified. Remote surgical neck fracture left humerus is noted.  IMPRESSION: Persistent opacity in the periphery of  the right upper lobe since January, 2016. While this could represent pneumonia given the patient's symptoms, infiltrating neoplasm such as adenocarcinoma remains within the differential particularly given its chronicity.   Electronically Signed   By: Drusilla Kanner M.D.   On: 12/15/2014 18:29     EKG Interpretation   Date/Time:  Wednesday December 15 2014 17:51:13 EDT Ventricular Rate:  107 PR Interval:    QRS Duration: 78 QT Interval:  424 QTC Calculation: 566 R Axis:   77 Text Interpretation:  Atrial fibrillation Ventricular premature complex  Borderline low voltage, extremity leads Nonspecific repol abnormality,  diffuse leads Prolonged QT interval Otherwise no significant change  Confirmed by HARRISON  MD, FORREST ([id number]) on 12/15/2014 6:23:57 PM      MDM   Final diagnoses:  CAP (community acquired pneumonia)  UTI (lower urinary tract infection)    Filed Vitals:   12/15/14 1926  BP: 147/78  Pulse: 93  Temp: 100.1 F (37.8 C)  Resp: 27   Medications  acetaminophen (TYLENOL) tablet 650 mg (not administered)  aspirin tablet 81 mg (not administered)  atenolol (TENORMIN) tablet 50 mg (not administered)  buPROPion (WELLBUTRIN) tablet 75 mg (not  administered)  diltiazem (CARDIZEM) tablet 60 mg (not administered)  feeding supplement (ENSURE ENLIVE) (ENSURE ENLIVE) liquid 237 mL (not administered)  memantine (NAMENDA XR) 24 hr capsule 28 mg (not administered)  enoxaparin (LOVENOX) injection 40 mg (not administered)  0.9 %  sodium chloride infusion (not administered)  vancomycin (VANCOCIN) IVPB 1000 mg/200 mL premix (not administered)  piperacillin-tazobactam (ZOSYN) IVPB 3.375 g (not administered)  albuterol (PROVENTIL) (2.5 MG/3ML) 0.083% nebulizer solution 5 mg (5 mg Nebulization Given 12/15/14 1935)  ipratropium (ATROVENT) nebulizer solution 0.5 mg (0.5 mg Nebulization Given 12/15/14 1935)   The patient has a urinary tract infection, elevated WBC at 20.5 and possible pneumonia on xray. The patients known mass is seen.  I spoke with the family (POA) and they prefer not to look further into the mass nor do they want the patient to know about it. They are agreeable to admission.  I spoke with Dr. Allena Katz with [facility] hospitalist and he has agreed for admission, he declines temp admit orders at this time.     Marlon Pel, PA-C 12/15/14 2112  Purvis Sheffield, MD 12/15/14 [phone]

## 2014-12-15 NOTE — Progress Notes (Signed)
Utilization Review completed.  Damaris Geers RN CM  

## 2014-12-15 NOTE — Progress Notes (Signed)
ANTIBIOTIC CONSULT NOTE - INITIAL  Pharmacy Consult for vancomycin and zosyn Indication: HCAP  Allergies  Allergen Reactions  . Codeine Other (See Comments)    Unknown reaction Per MAR     Patient Measurements:    Body Weight: 67.1kg  Vital Signs: Temp: 100.1 F (37.8 C) (07/06 1926) Temp Source: Rectal (07/06 1926) BP: 147/78 mmHg (07/06 1926) Pulse Rate: 93 (07/06 1926) Intake/Output from previous day:   Intake/Output from this shift:    Labs:  Recent Labs  12/15/14 1916  WBC 20.5*  HGB 13.6  PLT 226  CREATININE 0.90   CrCl cannot be calculated (Unknown ideal weight.). No results for input(s): VANCOTROUGH, VANCOPEAK, VANCORANDOM, GENTTROUGH, GENTPEAK, GENTRANDOM, TOBRATROUGH, TOBRAPEAK, TOBRARND, AMIKACINPEAK, AMIKACINTROU, AMIKACIN in the last 72 hours.   Microbiology: No results found for this or any previous visit (from the past 720 hour(s)).  Medical History: Past Medical History  Diagnosis Date  . Gastroesophageal reflux disease   . Atrial fibrillation 2013    a. Dx 02/2012, had NSTEMI at that time. Rate control/anticoag.  . Alzheimer's dementia   . Endometrial hyperplasia with atypia 1995  . Personal history of colonic polyps 2000  . Hyperlipidemia   . Hypertension   . Macular degeneration   . Osteoarthritis   . Osteopenia   . NSTEMI (non-ST elevated myocardial infarction) 02/2012    a. Cath: large filling defect apical LAD suggestive of embolus - LM, RCA, LCx OK. Cath complicated by delirium  . Insomnia 11/16/2012  . Coronary embolism     a. 02/2012 with NSTEMI (see above).  . Cardiomyopathy     a. EF 45% by cath 02/2012, 30% by echocardiogram.     Assessment: 81 y.o.female with a significant PMH of GERD, atrial fibrillation, Alzheimer's disease, endometrial hyperplasia, hypertension, arthritis, insomnia, coronary embolism, cardiomyopathy.  The patient is brought to the ER from [facility] with complaints of hypoxia, cough, fever, anorexia,  weakness. She has a known mass to the right upper lung but the family prefers that she does not know about this due to the fact that she would be really worried about a possible metastasis.  Pharmacy consulted to dose vanc and zosyn for HCAP.  Scr 0.9, CrCl ~ 37mls/min  Goal of Therapy:  vanc and zosyn per renal function vanc trough goal 15-20  Plan:  Zosyn 3.375mg  IV q8h extended interval Vancomycin 1gm IV q24h Follow cultures, renal function, clinical course  Arley Phenix RPh 12/15/2014, 9:27 PM Pager [phone]

## 2014-12-15 NOTE — ED Notes (Signed)
Note:  She comes to [place] from [facility].

## 2014-12-15 NOTE — ED Notes (Signed)
Pt. States she has had a cough plus low grade fevers x 2 days.  She c/o mild shortness of breath; and is in no distress.

## 2014-12-15 NOTE — ED Notes (Signed)
Bed: RR11 Expected date:  Expected time:  Means of arrival:  Comments: EMS/41F/SOB with exert/fever

## 2014-12-16 DIAGNOSIS — J189 Pneumonia, unspecified organism: Principal | ICD-10-CM

## 2014-12-16 DIAGNOSIS — R918 Other nonspecific abnormal finding of lung field: Secondary | ICD-10-CM | POA: Diagnosis present

## 2014-12-16 LAB — STREP PNEUMONIAE URINARY ANTIGEN: STREP PNEUMO URINARY ANTIGEN: NEGATIVE

## 2014-12-16 LAB — EXPECTORATED SPUTUM ASSESSMENT W REFEX TO RESP CULTURE

## 2014-12-16 LAB — MRSA PCR SCREENING: MRSA by PCR: NEGATIVE

## 2014-12-16 MED ORDER — HALOPERIDOL LACTATE 5 MG/ML IJ SOLN
2.0000 mg | Freq: Once | INTRAMUSCULAR | Status: AC
  Administered 2014-12-16: 2 mg via INTRAVENOUS
  Filled 2014-12-16: qty 1

## 2014-12-16 MED ORDER — IPRATROPIUM-ALBUTEROL 0.5-2.5 (3) MG/3ML IN SOLN
3.0000 mL | Freq: Three times a day (TID) | RESPIRATORY_TRACT | Status: DC
  Administered 2014-12-16 – 2014-12-17 (×3): 3 mL via RESPIRATORY_TRACT
  Filled 2014-12-16 (×4): qty 3

## 2014-12-16 MED ORDER — IPRATROPIUM-ALBUTEROL 0.5-2.5 (3) MG/3ML IN SOLN
3.0000 mL | Freq: Four times a day (QID) | RESPIRATORY_TRACT | Status: DC
  Administered 2014-12-16 (×2): 3 mL via RESPIRATORY_TRACT
  Filled 2014-12-16 (×2): qty 3

## 2014-12-16 MED ORDER — METHYLPREDNISOLONE SODIUM SUCC 125 MG IJ SOLR
60.0000 mg | Freq: Every day | INTRAMUSCULAR | Status: DC
  Administered 2014-12-16 (×2): 60 mg via INTRAVENOUS
  Filled 2014-12-16 (×2): qty 2

## 2014-12-16 NOTE — Progress Notes (Signed)
Initial Nutrition Assessment  DOCUMENTATION CODES:  Not applicable  INTERVENTION: - Continue Ensure Enlive BID, each supplement provides 350 kcal and 20 grams of protein - RD will continue to monitor for needs  NUTRITION DIAGNOSIS:  Increased nutrient needs related to catabolic illness as evidenced by estimated needs.  GOAL:  Patient will meet greater than or equal to 90% of their needs  MONITOR:  PO intake, Supplement acceptance, Weight trends, Labs, I & O's  REASON FOR ASSESSMENT:  Malnutrition Screening Tool  ASSESSMENT: 81 y.o. female with Past medical history of dementia, A. Fib, hypertension, coronary artery disease, possible lung malignancy. The patient is a resident of nursing home facility [facility] and was brought in by family secondary to increasing lethargy, cough, shortness of breath. The patient was reportedly at her baseline until yesterday when she started having complaints of cough as well as lethargy.  Pt seen for MST. BMI indicates normal weight status. Pt with hx of dementia and able to provide some information but most of the information was provided by her daughter. Pt had some grits and eggs this AM and drank a full Ensure. Her appetite has slightly improved recently but was decreased for a few days due to generally not feeling well.  Encouraged the use of Ensure, especially when she is feeling tired and too weak to eat. Daughter indicates pt's weight has been stable recently. Per weight hx review, pt has lost 13 lbs (8% body weight) in the past 7 months which is not significant for time frame. No muscle or fat wasting noted during physical assessment.  Likely not meeting needs. Medications reviewed. Labs reviewed; GFR: 54.  Height:  Ht Readings from Last 1 Encounters:  12/15/14 5\' 6"  (1.676 m)    Weight:  Wt Readings from Last 1 Encounters:  12/15/14 147 lb 7.8 oz (66.9 kg)    Ideal Body Weight:  59.1 kg (kg)  Wt Readings from Last 10  Encounters:  12/15/14 147 lb 7.8 oz (66.9 kg)  11/03/14 148 lb (67.132 kg)  10/12/14 150 lb 14.4 oz (68.448 kg)  08/25/14 152 lb (68.947 kg)  08/05/14 157 lb 6.4 oz (71.396 kg)  07/27/14 157 lb 9.6 oz (71.487 kg)  07/21/14 157 lb 9.6 oz (71.487 kg)  07/13/14 156 lb 12.8 oz (71.124 kg)  07/07/14 160 lb 9.6 oz (72.848 kg)  05/03/14 158 lb (71.668 kg)    BMI:  Body mass index is 23.82 kg/(m^2).  Estimated Nutritional Needs:  Kcal:  1610-9604  Protein:  75-85 grams  Fluid:  2.2 L/day  Skin:  Reviewed, no issues  Diet Order:  Diet regular Room service appropriate?: Yes; Fluid consistency:: Thin  EDUCATION NEEDS:  No education needs identified at this time   Intake/Output Summary (Last 24 hours) at 12/16/14 1044 Last data filed at 12/16/14 0900  Gross per 24 hour  Intake 1033.75 ml  Output      0 ml  Net 1033.75 ml    Last BM:  PTA    Trenton Gammon, RD, LDN Inpatient Clinical Dietitian Pager # [phone] After hours/weekend pager # [phone]

## 2014-12-16 NOTE — Progress Notes (Signed)
[facility] PROGRESS NOTE  [name] [facility] ZOX:[id number] DOB: February 26, 1922 DOA: 12/15/2014 PCP: [name], DO  Assessment/Plan: 1. Health care associated pneumonia: - admitted for IV antibiotics.  Bronchodilators as neede.d  Oxygen sats on RA are good.   2. Lung mass: Family does not want any further intervention because of her age   Dementia: Slightly agitated and confused this evening.  Prn haldol.   Paroxysmal afib: Rate controlled.     Code Status: DNR Family Communication: daughter and sitter at bedside Disposition Plan: pending.    Consultants:  none  Procedures:  none  Antibiotics: Vancomycin zosyn HPI/Subjective: FEEL FINE. Wants to go back   Objective: Filed Vitals:   12/16/14 1739  BP:   Pulse:   Temp: 98.1 F (36.7 C)  Resp:     Intake/Output Summary (Last 24 hours) at 12/16/14 1857 Last data filed at 12/16/14 1500  Gross per 24 hour  Intake 1153.75 ml  Output      0 ml  Net 1153.75 ml   Filed Weights   12/15/14 2133  Weight: 66.9 kg (147 lb 7.8 oz)    Exam:   General:  Alert afebrile comfortable  Cardiovascular: s1s2  Respiratory: ctab  Abdomen: soft NT ND BS+  Musculoskeletal: NO PEDAL EDEMA.   Data Reviewed: Basic Metabolic Panel:  Recent Labs Lab 12/15/14 1916  NA 137  K 3.8  CL 101  CO2 25  GLUCOSE 130*  BUN 17  CREATININE 0.90  CALCIUM 9.2   Liver Function Tests: No results for input(s): AST, ALT, ALKPHOS, BILITOT, PROT, ALBUMIN in the last 168 hours. No results for input(s): LIPASE, AMYLASE in the last 168 hours. No results for input(s): AMMONIA in the last 168 hours. CBC:  Recent Labs Lab 12/15/14 1916  WBC 20.5*  NEUTROABS 18.6*  HGB 13.6  HCT 41.2  MCV 91.4  PLT 226   Cardiac Enzymes: No results for input(s): CKTOTAL, CKMB, CKMBINDEX, TROPONINI in the last 168 hours. BNP (last 3 results) No results for input(s): BNP in the last 8760 hours.  ProBNP (last 3 results) No results  for input(s): PROBNP in the last 8760 hours.  CBG: No results for input(s): GLUCAP in the last 168 hours.  Recent Results (from the past 240 hour(s))  MRSA PCR Screening     Status: None   Collection Time: 12/16/14  1:26 PM  Result Value Ref Range Status   MRSA by PCR NEGATIVE NEGATIVE Final    Comment:        The GeneXpert MRSA Assay (FDA approved for NASAL specimens only), is one component of a comprehensive MRSA colonization surveillance program. It is not intended to diagnose MRSA infection nor to guide or monitor treatment for MRSA infections.      Studies: Dg Chest 2 View (if Patient Has Fever And/or Copd)  12/15/2014   CLINICAL DATA:  Low grade cough and fever for 2 days.  EXAM: CHEST  2 VIEW  COMPARISON:  CT chest 08/12/2014.  PA and lateral chest 07/08/2014.  FINDINGS: Peripheral opacity in the right upper lobe is again identified and appears more dense than on the prior plain films. The left lung appears clear. Heart size is mildly enlarged. No pneumothorax or pleural effusion is identified. Remote surgical neck fracture left humerus is noted.  IMPRESSION: Persistent opacity in the periphery of the right upper lobe since January, 2016. While this could represent pneumonia given the patient's symptoms, infiltrating neoplasm such as adenocarcinoma remains within the differential particularly given  its chronicity.   Electronically Signed   By: Drusilla Kanner M.D.   On: 12/15/2014 18:29    Scheduled Meds: . acetaminophen  650 mg Oral Daily  . aspirin EC  81 mg Oral Daily  . atenolol  50 mg Oral Q breakfast  . buPROPion  75 mg Oral q morning - 10a  . diltiazem  60 mg Oral 4 times per day  . enoxaparin (LOVENOX) injection  40 mg Subcutaneous Q24H  . feeding supplement (ENSURE ENLIVE)  237 mL Oral q morning - 10a  . ipratropium-albuterol  3 mL Nebulization TID  . memantine  28 mg Oral Q breakfast  . methylPREDNISolone (SOLU-MEDROL) injection  60 mg Intravenous QHS  .  piperacillin-tazobactam (ZOSYN)  IV  3.375 g Intravenous Q8H  . vancomycin  1,000 mg Intravenous Q24H   Continuous Infusions: . sodium chloride 75 mL/hr at 12/15/14 2126    Principal Problem:   HCAP (healthcare-associated pneumonia) Active Problems:   Atrial fibrillation   Hypertension   Gastroesophageal reflux disease   Alzheimer disease   Lung mass    Time spent: 35 min    Kinga Cassar  [facility] Pager [phone] If 7PM-7AM, please contact night-coverage at [email or web address], password [facility] 12/16/2014, 6:57 PM  LOS: 1 day

## 2014-12-16 NOTE — H&P (Signed)
[facility] History and Physical  Patient: [name]  MRN: [id number]  DOB: 1921-12-30  DOS: the patient was seen and examined on 12/15/2014 PCP: [name], DO  Referring physician: Marlon Pel, PA-C Chief Complaint: confusion and shortness of breath  HPI: [name] is a 81 y.o. female with Past medical history of dementia, A. Fib, hypertension, coronary artery disease, possible lung malignancy. The patient is a resident of nursing home facility [facility] and was brought in by family secondary to increasing lethargy, cough, shortness of breath. The patient was reportedly at her baseline until yesterday when she started having complaints of cough as well as lethargy. She then started having increasing fatigue and shortness of breath which is progressively worsening. She also had some wheezing. With this it brought her to the hospital. Patient at the time of my evaluation denies any complaints of chest pain and abdominal pain. There is no choking episode reported there was no nausea vomiting at the time of my evaluation but she did have a vomiting episode earlier in the morning. Neck no diarrhea no constipation. Patient has not been able to get any of her medications today. Patient has been ambulating with a walker. And is independent for most of her ADL does not manages her medication on her own.  Review of Systems: as mentioned in the history of present illness.  A comprehensive review of the other systems is negative.  Past Medical History  Diagnosis Date  . Gastroesophageal reflux disease   . Atrial fibrillation 2013    a. Dx 02/2012, had NSTEMI at that time. Rate control/anticoag.  . Alzheimer's dementia   . Endometrial hyperplasia with atypia 1995  . Personal history of colonic polyps 2000  . Hyperlipidemia   . Hypertension   . Macular degeneration   . Osteoarthritis   . Osteopenia   . NSTEMI (non-ST elevated myocardial infarction) 02/2012    a.  Cath: large filling defect apical LAD suggestive of embolus - LM, RCA, LCx OK. Cath complicated by delirium  . Insomnia 11/16/2012  . Coronary embolism     a. 02/2012 with NSTEMI (see above).  . Cardiomyopathy     a. EF 45% by cath 02/2012, 30% by echocardiogram.   Past Surgical History  Procedure Laterality Date  . Appendectomy  1977  . Abdominal hysterectomy  1995    TAH/BSO  . Left heart catheterization with coronary angiogram N/A 02/19/2012    Procedure: LEFT HEART CATHETERIZATION WITH CORONARY ANGIOGRAM;  Surgeon: Tonny Bollman, MD;  Location: [facility] CATH LAB;  Service: Cardiovascular;  Laterality: N/A;   Social History:  reports that she quit smoking about 42 years ago. Her smoking use included Cigarettes. She has never used smokeless tobacco. She reports that she drinks about 1.2 oz of alcohol per week. She reports that she does not use illicit drugs.  Allergies  Allergen Reactions  . Codeine Other (See Comments)    Unknown reaction Per MAR     Family History  Problem Relation Age of Onset  . Heart disease Father     Prior to Admission medications   Medication Sig Start Date End Date Taking? Authorizing Provider  acetaminophen (TYLENOL) 325 MG tablet Take 650 mg by mouth daily. 11/03/14  Yes Historical Provider, MD  aspirin 81 MG tablet Take 81 mg by mouth daily.  06/16/13  Yes Claudette Royston Sinner, NP  atenolol (TENORMIN) 50 MG tablet Take 50 mg by mouth daily with breakfast.    Yes Historical Provider, MD  buPROPion (WELLBUTRIN) 75 MG tablet Take 75 mg by mouth every morning.   Yes Historical Provider, MD  Cholecalciferol (VITAMIN D) 1000 UNITS capsule Take 1,000 Units by mouth daily.   Yes Historical Provider, MD  diltiazem (DILACOR XR) 240 MG 24 hr capsule Take one tablet by mouth once daily 08/05/13  Yes Sharon Seller, NP  feeding supplement, ENSURE ENLIVE, (ENSURE ENLIVE) LIQD Take 237 mLs by mouth every morning.   Yes Historical Provider, MD  Melatonin 10 MG CAPS Take 1  capsule by mouth at bedtime. 11/09/14  Yes [name] L [name], DO  memantine (NAMENDA XR) 28 MG CP24 24 hr capsule Take 28 mg by mouth daily with breakfast.   Yes Historical Provider, MD  Multiple Vitamins-Minerals (PRESERVISION AREDS 2 PO) Take 1 capsule by mouth 2 (two) times daily.   Yes Historical Provider, MD  ondansetron (ZOFRAN) 4 MG tablet Take 4 mg by mouth 2 (two) times daily with a meal.   Yes Historical Provider, MD    Physical Exam: Filed Vitals:   12/15/14 1926 12/15/14 1938 12/15/14 2133 12/15/14 2201  BP: 147/78  132/64 129/91  Pulse: 93  102 100  Temp: 100.1 F (37.8 C)     TempSrc: Rectal     Resp: 27  20   Height:    (1.676 m)   Weight:   66.9 kg (147 lb 7.8 oz)   SpO2: 95% 95% 96% 97%    General: Alert, Awake and Oriented to Time, Place and Person. Appear in mild distress Eyes: PERRL ENT: Oral Mucosa clear moist. Neck: no JVD Cardiovascular: S1 and S2 Present, no Murmur, Peripheral Pulses Present Respiratory: Bilateral Air entry equal and Decreased,  Bilateral Crackles, bilateral extensive expiratory wheezes Abdomen: Bowel Sound present, Soft and non tender Skin: no Rash Extremities: no Pedal edema, no calf tenderness Neurologic: Grossly no focal neuro deficit.  Labs on Admission:  CBC:  Recent Labs Lab 12/15/14 1916  WBC 20.5*  NEUTROABS 18.6*  HGB 13.6  HCT 41.2  MCV 91.4  PLT 226    CMP     Component Value Date/Time   NA 137 12/15/2014 1916   NA 142 07/20/2014   K 3.8 12/15/2014 1916   CL 101 12/15/2014 1916   CO2 25 12/15/2014 1916   GLUCOSE 130* 12/15/2014 1916   BUN 17 12/15/2014 1916   BUN 17 07/20/2014   CREATININE 0.90 12/15/2014 1916   CREATININE 0.8 07/20/2014   CALCIUM 9.2 12/15/2014 1916   PROT 6.7 07/08/2014 2144   ALBUMIN 3.6 07/08/2014 2144   AST 36* 07/20/2014   ALT 31 07/20/2014   ALKPHOS 92 07/20/2014   BILITOT 0.6 07/08/2014 2144   GFRNONAA 54* 12/15/2014 1916   GFRAA >60 12/15/2014 1916    No results for  input(s): LIPASE, AMYLASE in the last 168 hours.  No results for input(s): CKTOTAL, CKMB, CKMBINDEX, TROPONINI in the last 168 hours. BNP (last 3 results) No results for input(s): BNP in the last 8760 hours.  ProBNP (last 3 results) No results for input(s): PROBNP in the last 8760 hours.   Radiological Exams on Admission: Dg Chest 2 View (if Patient Has Fever And/or Copd)  12/15/2014   CLINICAL DATA:  Low grade cough and fever for 2 days.  EXAM: CHEST  2 VIEW  COMPARISON:  CT chest 08/12/2014.  PA and lateral chest 07/08/2014.  FINDINGS: Peripheral opacity in the right upper lobe is again identified and appears more dense than on the prior plain films.  The left lung appears clear. Heart size is mildly enlarged. No pneumothorax or pleural effusion is identified. Remote surgical neck fracture left humerus is noted.  IMPRESSION: Persistent opacity in the periphery of the right upper lobe since January, 2016. While this could represent pneumonia given the patient's symptoms, infiltrating neoplasm such as adenocarcinoma remains within the differential particularly given its chronicity.   Electronically Signed   By: Drusilla Kanner M.D.   On: 12/15/2014 18:29   Assessment/Plan Principal Problem:   HCAP (healthcare-associated pneumonia) Active Problems:   Atrial fibrillation   Hypertension   Gastroesophageal reflux disease   Alzheimer disease   Lung mass   1. HCAP (healthcare-associated pneumonia) The patient is presenting with complains of cough and shortness of breath. She is from another possible facility therefore she will be treated as a health considered pneumonia based on her x-ray and clinical examination. She also appears to have bilateral expiratory wheezing consistent with possible bronchiolitis. With this finding I would treat her with Steri-Strips as well as duo nebs along with antibiotics. We will follow cultures.  2.possible lung Thomas. Possibility of postobstructive chronic  infection cannot be ruled out. Patient family has decided not to pursue any further workup secondary to patient's age as well as not to informed the patient about this finding.  3.Alzheimer's dementia. Continuing home medications.  4.history of hypertension. Continuing home medications.  5. History of A. Fib. Continuing home medications. Continue aspirin.  Advance goals of care discussion: DNR/DNI   DVT Prophylaxis: subcutaneous Heparin Nutrition: regular diet  Family Communication: family was present at bedside, opportunity was given to ask question and all questions were answered satisfactorily at the time of interview. Disposition: Admitted as inpatient, telemetry unit.  Author: Lynden Oxford, MD [facility] Pager: [phone] 12/15/2014  If 7PM-7AM, please contact night-coverage [email or web address] Password [email or web address]

## 2014-12-17 DIAGNOSIS — G309 Alzheimer's disease, unspecified: Secondary | ICD-10-CM

## 2014-12-17 LAB — CBC
HCT: 41.1 % (ref 36.0–46.0)
Hemoglobin: 13.4 g/dL (ref 12.0–15.0)
MCH: 30.2 pg (ref 26.0–34.0)
MCHC: 32.6 g/dL (ref 30.0–36.0)
MCV: 92.6 fL (ref 78.0–100.0)
PLATELETS: 223 10*3/uL (ref 150–400)
RBC: 4.44 MIL/uL (ref 3.87–5.11)
RDW: 14 % (ref 11.5–15.5)
WBC: 19.2 10*3/uL — AB (ref 4.0–10.5)

## 2014-12-17 LAB — LEGIONELLA ANTIGEN, URINE

## 2014-12-17 MED ORDER — PREDNISONE 10 MG (21) PO TBPK: 10.0000 mg | ORAL_TABLET | Freq: Every day | ORAL | Status: DC

## 2014-12-17 MED ORDER — LEVOFLOXACIN 750 MG PO TABS: 750.0000 mg | ORAL_TABLET | Freq: Every day | ORAL | Status: DC

## 2014-12-17 MED ORDER — IPRATROPIUM-ALBUTEROL 0.5-2.5 (3) MG/3ML IN SOLN: 3.0000 mL | Freq: Four times a day (QID) | RESPIRATORY_TRACT | Status: AC | PRN

## 2014-12-17 MED ORDER — HALOPERIDOL LACTATE 5 MG/ML IJ SOLN
2.0000 mg | Freq: Once | INTRAMUSCULAR | Status: AC
  Administered 2014-12-17: 2 mg via INTRAVENOUS
  Filled 2014-12-17: qty 1

## 2014-12-17 NOTE — Care Management Note (Signed)
Case Management Note  Patient Details  Name: [name] MRN: [id number] Date of Birth: Jun 13, 1921  Subjective/Objective:                    Action/Plan:  Leretha Pol will provide HHPT   Expected Discharge Date:   (unknown)               Expected Discharge Plan:     In-House Referral:     Discharge planning Services     Post Acute Care Choice:    Choice offered to:     DME Arranged:    DME Agency:     HH Arranged:    HH Agency:     Status of Service:     Medicare Important Message Given:  Yes-second notification given Date Medicare IM Given:    Medicare IM give by:    Date Additional Medicare IM Given:    Additional Medicare Important Message give by:     If discussed at Long Length of Stay Meetings, dates discussed:    Additional CommentsGeni Bers, RN 12/17/2014, 2:08 PM

## 2014-12-17 NOTE — Progress Notes (Signed)
Attempted to call report to [facility] x2, left a message. Pt being transported by family.  Lenox Ponds, RN

## 2014-12-17 NOTE — Progress Notes (Signed)
ANTIBIOTIC CONSULT NOTE   Pharmacy Consult for vancomycin and zosyn Indication: HCAP  Allergies  Allergen Reactions  . Codeine Other (See Comments)    Unknown reaction Per [facility]     Patient Measurements: Height: 5\' 6"  (167.6 cm) (per daughter) Weight: 147 lb 7.8 oz (66.9 kg) IBW/kg (Calculated) : 59.3  Body Weight: 67.1kg  Vital Signs: Temp: 98.1 F (36.7 C) (07/08 0631) Temp Source: Oral (07/08 0631) BP: 138/59 mmHg (07/08 0631) Pulse Rate: 98 (07/08 0631) Intake/Output from previous day: 07/07 0701 - 07/08 0700 In: 2818.8 [P.O.:720; I.V.:1748.8; IV Piggyback:350] Out: 560 [Urine:560] Intake/Output from this shift:    Labs:  Recent Labs  12/15/14 1916 12/17/14 0439  WBC 20.5* 19.2*  HGB 13.6 13.4  PLT 226 223  CREATININE 0.90  --    Estimated Creatinine Clearance: 38.1 mL/min (by C-G formula based on Cr of 0.9). No results for input(s): VANCOTROUGH, VANCOPEAK, VANCORANDOM, GENTTROUGH, GENTPEAK, GENTRANDOM, TOBRATROUGH, TOBRAPEAK, TOBRARND, AMIKACINPEAK, AMIKACINTROU, AMIKACIN in the last 72 hours.   Microbiology: Recent Results (from the past 720 hour(s))  MRSA PCR Screening     Status: None   Collection Time: 12/16/14  1:26 PM  Result Value Ref Range Status   MRSA by PCR NEGATIVE NEGATIVE Final    Comment:        The GeneXpert MRSA Assay (FDA approved for NASAL specimens only), is one component of a comprehensive MRSA colonization surveillance program. It is not intended to diagnose MRSA infection nor to guide or monitor treatment for MRSA infections.   Culture, sputum-assessment     Status: None   Collection Time: 12/16/14  6:54 PM  Result Value Ref Range Status   Specimen Description SPUTUM  Final   Special Requests NONE  Final   Sputum evaluation   Final    THIS SPECIMEN IS ACCEPTABLE. RESPIRATORY CULTURE REPORT TO FOLLOW.   Report Status 12/16/2014 FINAL  Final    Medical History: Past Medical History  Diagnosis Date  . Gastroesophageal  reflux disease   . Atrial fibrillation 2013    a. Dx 02/2012, had NSTEMI at that time. Rate control/anticoag.  . Alzheimer's dementia   . Endometrial hyperplasia with atypia 1995  . Personal history of colonic polyps 2000  . Hyperlipidemia   . Hypertension   . Macular degeneration   . Osteoarthritis   . Osteopenia   . NSTEMI (non-ST elevated myocardial infarction) 02/2012    a. Cath: large filling defect apical LAD suggestive of embolus - LM, RCA, LCx OK. Cath complicated by delirium  . Insomnia 11/16/2012  . Coronary embolism     a. 02/2012 with NSTEMI (see above).  . Cardiomyopathy     a. EF 45% by cath 02/2012, 30% by echocardiogram.     Assessment: 81 y.o.female with a significant PMH of GERD, atrial fibrillation, Alzheimer's disease, endometrial hyperplasia, hypertension, arthritis, insomnia, coronary embolism, cardiomyopathy.  The patient is brought to the ER from [facility] with complaints of hypoxia, cough, fever, anorexia, weakness. She has a known mass to the right upper lung but the family prefers that she does not know about this due to the fact that she would be really worried about a possible metastasis.  Pharmacy consulted to dose vanc and zosyn for HCAP.  7/6 >>vanc  >> 7/6 >>zosyn  >>    WBC elevated (unchanged) Tm: afeb Renal: no BMP since 7/6, Normalized CrCl = 41ml/min  7/7 blood: pending 7/7 sputum:  pending Strep Ag: negative Legionella Ag: Pending  Goal of Therapy:  vanco and zosyn per renal function vanco trough goal 15-20 mcg/ml  Plan:  Day #2 vancomycin/zosyn  Continue Zosyn 3.375mg  IV q8h extended interval  Continue Vancomycin 1gm IV q24h. Will likely need trough on Sunday if vancomycin to continue  Follow cultures, renal function, clinical course  Juliette Alcide, PharmD, BCPS.   Pager: [phone] 12/17/2014 10:02 AM

## 2014-12-17 NOTE — Care Management (Signed)
Important Message  Patient Details  Name: [name] MRN: [id number] Date of Birth: 1921-08-18   Medicare Important Message Given:  Yes-second notification given    Haskell Flirt 12/17/2014, 11:11 AM

## 2014-12-17 NOTE — Discharge Summary (Signed)
Physician Discharge Summary  [name] [facility] ZOX:[id number] DOB: Oct 22, 1921 DOA: 12/15/2014  PCP: [name], DO  Admit date: 12/15/2014 Discharge date: 12/17/2014  Time spent: 30 minutes  Recommendations for Outpatient Follow-up:  1. Follow up with PCP in one week 2. Follow up with CXR in 4 weeks.  3. follo wup repeat CBC in 2 days.   Discharge Diagnoses:  Principal Problem:   HCAP (healthcare-associated pneumonia) Active Problems:   Atrial fibrillation   Hypertension   Gastroesophageal reflux disease   Alzheimer disease   Lung mass   Discharge Condition: improved.   Diet recommendation: low sodium diet.   Filed Weights   12/15/14 2133  Weight: 66.9 kg (147 lb 7.8 oz)    History of present illness:  81 year old lady admitted for sob and cough.   Hospital Course:  1. Health care associated pneumonia: - admitted for IV antibiotics, later on transitioned to oral antibiotics on discharge.  Bronchodilators as needed. Prednisone for the next 5 days.  Oxygen sats on RA are good.   2. Lung mass: Family does not want any further intervention because of her age   Dementia: Stable.   Paroxysmal afib: Rate controlled.     Procedures:  none  Consultations:  none  Discharge Exam: Filed Vitals:   12/17/14 1246  BP: 138/62  Pulse: 75  Temp: 97.4 F (36.3 C)  Resp: 20    General: alert afebrile comfortable.  Cardiovascular: s1s2 Respiratory: ctab  Discharge Instructions   Discharge Instructions    Diet - low sodium heart healthy    Complete by:  As directed      Discharge instructions    Complete by:  As directed   Follow up with PCP in one week.  Please check CXR in 4 weeks for evaluation of resolution of pneumonia.  Please check CBC in 2 days to follow up wbc count.     Increase activity slowly    Complete by:  As directed           Current Discharge Medication List    START taking these medications   Details  ipratropium-albuterol  (DUONEB) 0.5-2.5 (3) MG/3ML SOLN Take 3 mLs by nebulization every 6 (six) hours as needed. Qty: 360 mL    levofloxacin (LEVAQUIN) 750 MG tablet Take 1 tablet (750 mg total) by mouth daily. Qty: 5 tablet, Refills: 0    predniSONE (STERAPRED UNI-PAK 21 TAB) 10 MG (21) TBPK tablet Take 1 tablet (10 mg total) by mouth daily. Prednisone 40 mg daily for 5 days Qty: 20 tablet, Refills: 0      CONTINUE these medications which have NOT CHANGED   Details  acetaminophen (TYLENOL) 325 MG tablet Take 650 mg by mouth daily.    aspirin 81 MG tablet Take 81 mg by mouth daily.     atenolol (TENORMIN) 50 MG tablet Take 50 mg by mouth daily with breakfast.     buPROPion (WELLBUTRIN) 75 MG tablet Take 75 mg by mouth every morning.    diltiazem (DILACOR XR) 240 MG 24 hr capsule Take one tablet by mouth once daily Qty: 30 capsule, Refills: 5    feeding supplement, ENSURE ENLIVE, (ENSURE ENLIVE) LIQD Take 237 mLs by mouth every morning.    Melatonin 10 MG CAPS Take 1 capsule by mouth at bedtime. Qty: 30 capsule, Refills: 3    memantine (NAMENDA XR) 28 MG CP24 24 hr capsule Take 28 mg by mouth daily with breakfast.    Multiple Vitamins-Minerals (PRESERVISION  AREDS 2 PO) Take 1 capsule by mouth 2 (two) times daily.    ondansetron (ZOFRAN) 4 MG tablet Take 4 mg by mouth 2 (two) times daily with a meal.      STOP taking these medications     Cholecalciferol (VITAMIN D) 1000 UNITS capsule        Allergies  Allergen Reactions  . Codeine Other (See Comments)    Unknown reaction Per MAR    Follow-up Information    Follow up with Kathryn, [name], DO. Schedule an appointment as soon as possible for a visit in 1 week.   Specialty:  Geriatric Medicine   Contact information:   [address] [place] [address] [phone]        The results of significant diagnostics from this hospitalization (including imaging, microbiology, ancillary and laboratory) are listed below for reference.     Significant Diagnostic Studies: Dg Chest 2 View (if Patient Has Fever And/or Copd)  12/15/2014   CLINICAL DATA:  Low grade cough and fever for 2 days.  EXAM: CHEST  2 VIEW  COMPARISON:  CT chest 08/12/2014.  PA and lateral chest 07/08/2014.  FINDINGS: Peripheral opacity in the right upper lobe is again identified and appears more dense than on the prior plain films. The left lung appears clear. Heart size is mildly enlarged. No pneumothorax or pleural effusion is identified. Remote surgical neck fracture left humerus is noted.  IMPRESSION: Persistent opacity in the periphery of the right upper lobe since January, 2016. While this could represent pneumonia given the patient's symptoms, infiltrating neoplasm such as adenocarcinoma remains within the differential particularly given its chronicity.   Electronically Signed   By: Drusilla Kanner M.D.   On: 12/15/2014 18:29    Microbiology: Recent Results (from the past 240 hour(s))  MRSA PCR Screening     Status: None   Collection Time: 12/16/14  1:26 PM  Result Value Ref Range Status   MRSA by PCR NEGATIVE NEGATIVE Final    Comment:        The GeneXpert MRSA Assay (FDA approved for NASAL specimens only), is one component of a comprehensive MRSA colonization surveillance program. It is not intended to diagnose MRSA infection nor to guide or monitor treatment for MRSA infections.   Culture, sputum-assessment     Status: None   Collection Time: 12/16/14  6:54 PM  Result Value Ref Range Status   Specimen Description SPUTUM  Final   Special Requests NONE  Final   Sputum evaluation   Final    THIS SPECIMEN IS ACCEPTABLE. RESPIRATORY CULTURE REPORT TO FOLLOW.   Report Status 12/16/2014 FINAL  Final     Labs: Basic Metabolic Panel:  Recent Labs Lab 12/15/14 1916  NA 137  K 3.8  CL 101  CO2 25  GLUCOSE 130*  BUN 17  CREATININE 0.90  CALCIUM 9.2   Liver Function Tests: No results for input(s): AST, ALT, ALKPHOS, BILITOT, PROT,  ALBUMIN in the last 168 hours. No results for input(s): LIPASE, AMYLASE in the last 168 hours. No results for input(s): AMMONIA in the last 168 hours. CBC:  Recent Labs Lab 12/15/14 1916 12/17/14 0439  WBC 20.5* 19.2*  NEUTROABS 18.6*  --   HGB 13.6 13.4  HCT 41.2 41.1  MCV 91.4 92.6  PLT 226 223   Cardiac Enzymes: No results for input(s): CKTOTAL, CKMB, CKMBINDEX, TROPONINI in the last 168 hours. BNP: BNP (last 3 results) No results for input(s): BNP in the last 8760  hours.  ProBNP (last 3 results) No results for input(s): PROBNP in the last 8760 hours.  CBG: No results for input(s): GLUCAP in the last 168 hours.     SignedKathlen Mody  [facility] 12/17/2014, 1:44 PM

## 2014-12-17 NOTE — Progress Notes (Signed)
Patient is set to discharge back to [facility] today. CSW confirmed with Lupita Leash at [facility] that they can take patient back today. Patient & daughter, Larita Fife aware. Discharge packet given to RN, Dorene Sorrow. Daughter to transport her back to ALF.     Lincoln Maxin, LCSW [facility] Clinical Social Worker cell #: [phone]

## 2014-12-17 NOTE — Evaluation (Signed)
Physical Therapy Evaluation Patient Details Name: [name] MRN: [id number] DOB: April 22, 1922 Today's Date: 12/17/2014   History of Present Illness  : [name] is a 81 y.o. female with Past medical history of dementia, A. Fib, hypertension, coronary artery disease, possible lung malignancy. admitted7/06/16 with SOB, hypoxia, HCAP.  Clinical Impression  Patient ambulating with assistance. Sats 94%RA/ family present and plan for patient to return to ALF. Patient will benefit from PT to address problems listed in note below/    Follow Up Recommendations Home health PT;Supervision/Assistance - 24 hour    Equipment Recommendations  None recommended by PT    Recommendations for Other Services       Precautions / Restrictions Precautions Precautions: Fall Restrictions Weight Bearing Restrictions: No      Mobility  Bed Mobility Overal bed mobility: Needs Assistance Bed Mobility: Supine to Sit     Supine to sit: Supervision        Transfers Overall transfer level: Needs assistance Equipment used: 1 person hand held assist Transfers: Sit to/from Stand Sit to Stand: Min assist         General transfer comment: cues for safety  Ambulation/Gait Ambulation/Gait assistance: Min assist Ambulation Distance (Feet): 420 Feet Assistive device: 1 person hand held assist Gait Pattern/deviations: Step-through pattern;Staggering left;Staggering right     General Gait Details: steady  assistyance for balance, does not use RW PTA.  Stairs            Wheelchair Mobility    Modified Rankin (Stroke Patients Only)       Balance Overall balance assessment: Needs assistance Sitting-balance support: No upper extremity supported;Feet supported Sitting balance-Leahy Scale: Good     Standing balance support: During functional activity;Single extremity supported Standing balance-Leahy Scale: Fair                               Pertinent Vitals/Pain  Pain Assessment: No/denies pain    Home Living Family/patient expects to be discharged to:: Assisted living               Home Equipment: Gilmer Mor - single point Additional Comments: has a sitter several hours /day    Prior Function Level of Independence: Needs assistance   Gait / Transfers Assistance Needed: ambulates with assist.           Hand Dominance        Extremity/Trunk Assessment   Upper Extremity Assessment: Generalized weakness           Lower Extremity Assessment: Generalized weakness      Cervical / Trunk Assessment: Kyphotic  Communication   Communication: No difficulties  Cognition Arousal/Alertness: Awake/alert Behavior During Therapy: WFL for tasks assessed/performed Overall Cognitive Status: History of cognitive impairments - at baseline                      General Comments      Exercises        Assessment/Plan    PT Assessment Patient needs continued PT services  PT Diagnosis Difficulty walking;Generalized weakness   PT Problem List Decreased strength;Decreased activity tolerance;Decreased mobility;Decreased balance;Decreased cognition  PT Treatment Interventions Gait training;Functional mobility training;Therapeutic activities;Therapeutic exercise;Balance training;Patient/family education   PT Goals (Current goals can be found in the Care Plan section) Acute Rehab PT Goals Patient Stated Goal: to return to ALF PT Goal Formulation: With family Time For Goal Achievement: 12/31/14 Potential to Achieve Goals: Good  Frequency Min 2X/week   Barriers to discharge        Co-evaluation               End of Session Equipment Utilized During Treatment: Gait belt Activity Tolerance: Patient tolerated treatment well Patient left: in chair;with family/visitor present;with call bell/phone within reach Nurse Communication: Mobility status         Time: 1610-9604 PT Time Calculation (min) (ACUTE ONLY): 16  min   Charges:   PT Evaluation $Initial PT Evaluation Tier I: 1 Procedure     PT G CodesRada Hay 12/17/2014, 12:22 PM Blanchard Kelch PT [phone]

## 2014-12-19 LAB — CULTURE, RESPIRATORY: Culture: NORMAL

## 2014-12-20 LAB — CBC AND DIFFERENTIAL
HEMATOCRIT: 41 % (ref 36–46)
HEMOGLOBIN: 13.5 g/dL (ref 12.0–16.0)
Platelets: 324 10*3/uL (ref 150–399)
WBC: 13.6 10^3/mL

## 2014-12-21 LAB — CULTURE, BLOOD (ROUTINE X 2)
CULTURE: NO GROWTH
Culture: NO GROWTH

## 2014-12-28 ENCOUNTER — Encounter: Payer: Self-pay | Admitting: Internal Medicine

## 2014-12-28 NOTE — Progress Notes (Signed)
error 

## 2014-12-28 NOTE — Progress Notes (Deleted)
Patient ID: [name], female   DOB: Jul 01, 1921, 81 y.o.   MRN: [id number]  Provider:  Gwenith Spitz. Renato Gails, D.O., C.M.D. Location:    PCP: [name], DO  Code Status: *** Goals of Care: Advanced Directive information     Chief Complaint  Patient presents with  . Readmission to ALF s/p hospitalization    readmitted 12/17/14 s/p hospitalization with fever, shortness of breath    HPI: 81 y.o. female ***  ROS: ROS  Past Medical History  Diagnosis Date  . Gastroesophageal reflux disease   . Atrial fibrillation 2013    a. Dx 02/2012, had NSTEMI at that time. Rate control/anticoag.  . Alzheimer's dementia   . Endometrial hyperplasia with atypia 1995  . Personal history of colonic polyps 2000  . Hyperlipidemia   . Hypertension   . Macular degeneration   . Osteoarthritis   . Osteopenia   . NSTEMI (non-ST elevated myocardial infarction) 02/2012    a. Cath: large filling defect apical LAD suggestive of embolus - LM, RCA, LCx OK. Cath complicated by delirium  . Insomnia 11/16/2012  . Coronary embolism     a. 02/2012 with NSTEMI (see above).  . Cardiomyopathy     a. EF 45% by cath 02/2012, 30% by echocardiogram.   Past Surgical History  Procedure Laterality Date  . Appendectomy  1977  . Abdominal hysterectomy  1995    TAH/BSO  . Left heart catheterization with coronary angiogram N/A 02/19/2012    Procedure: LEFT HEART CATHETERIZATION WITH CORONARY ANGIOGRAM;  Surgeon: Tonny Bollman, MD;  Location[facility] CATH LAB;  Service: Cardiovascular;  Laterality: N/A;   Social History:   reports that she quit smoking about 42 years ago. Her smoking use included Cigarettes. She has never used smokeless tobacco. She reports that she drinks about 1.2 oz of alcohol per week. She reports that she does not use illicit drugs.  Family History  Problem Relation Age of Onset  . Heart disease Father     Allergies  Allergen Reactions  . Codeine Other (See Comments)    Unknown reaction Per MAR      Medications: Patient's Medications  New Prescriptions   No medications on file  Previous Medications   ACETAMINOPHEN (TYLENOL) 325 MG TABLET    Take 650 mg by mouth daily.   ASPIRIN 81 MG TABLET    Take 81 mg by mouth daily.    ATENOLOL (TENORMIN) 50 MG TABLET    Take 50 mg by mouth daily with breakfast.    BUPROPION (WELLBUTRIN) 75 MG TABLET    Take 75 mg by mouth every morning.   DILTIAZEM (DILACOR XR) 240 MG 24 HR CAPSULE    Take one tablet by mouth once daily   FEEDING SUPPLEMENT, ENSURE ENLIVE, (ENSURE ENLIVE) LIQD    Take 237 mLs by mouth every morning.   IPRATROPIUM-ALBUTEROL (DUONEB) 0.5-2.5 (3) MG/3ML SOLN    Take 3 mLs by nebulization every 6 (six) hours as needed.   LEVOFLOXACIN (LEVAQUIN) 750 MG TABLET    Take 1 tablet (750 mg total) by mouth daily.   MELATONIN 10 MG CAPS    Take 1 capsule by mouth at bedtime.   MEMANTINE (NAMENDA XR) 28 MG CP24 24 HR CAPSULE    Take 28 mg by mouth daily with breakfast.   MULTIPLE VITAMINS-MINERALS (PRESERVISION AREDS 2 PO)    Take 1 capsule by mouth 2 (two) times daily.   ONDANSETRON (ZOFRAN) 4 MG TABLET    Take 4 mg by  mouth 2 (two) times daily with a meal.   PREDNISONE (STERAPRED UNI-PAK 21 TAB) 10 MG (21) TBPK TABLET    Take 1 tablet (10 mg total) by mouth daily. Prednisone 40 mg daily for 5 days  Modified Medications   No medications on file  Discontinued Medications   No medications on file     Physical Exam: There were no vitals filed for this visit. There is no weight on file to calculate BMI. Physical Exam   Labs reviewed: Basic Metabolic Panel:  Recent Labs  95/62/13 0525 07/12/14 0738 07/20/14 12/15/14 1916  NA 142 138 142 137  K 3.9 3.3* 4.1 3.8  CL 106 103  --  101  CO2 28 25  --  25  GLUCOSE 120* 114*  --  130*  BUN 26* CREATININE 0.75 0.63 0.8 0.90  CALCIUM 8.9 8.8  --  9.2   Liver Function Tests:  Recent Labs  07/08/14 2144 07/20/14  AST 17 36*  ALT 13 31  ALKPHOS 64 92  BILITOT 0.6   --   PROT 6.7  --   ALBUMIN 3.6  --    No results for input(s): LIPASE, AMYLASE in the last 8760 hours. No results for input(s): AMMONIA in the last 8760 hours. CBC:  Recent Labs  07/08/14 2144 07/09/14 0525  07/28/14 12/15/14 1916 12/17/14 0439  WBC 12.1* 10.6*  < > 9.3 20.5* 19.2*  NEUTROABS 9.1*  --   --   --  18.6*  --   HGB 12.4 11.9*  < > 12.4 13.6 13.4  HCT 38.7 37.5  < > 37 41.2 41.1  MCV 93.5 93.5  --   --  91.4 92.6  PLT 204 249  < > 411* 226 223  < > = values in this interval not displayed. Cardiac Enzymes: No results for input(s): CKTOTAL, CKMB, CKMBINDEX, TROPONINI in the last 8760 hours. BNP: Invalid input(s): POCBNP  No results found for: HGBA1C Lab Results  Component Value Date   TSH 1.723 07/09/2014   No results found for: VITAMINB12 No results found for: FOLATE No results found for: IRON, TIBC, FERRITIN  Imaging and Procedures obtained prior to SNF admission:  Patient Care Team: Kermit Balo, DO as PCP - General (Geriatric Medicine) Lewayne Bunting, MD as Consulting Physician (Cardiology) [facility]  Assessment/Plan There are no diagnoses linked to this encounter.  Functional status:  Family/ staff Communication:   Labs/tests ordered:  Ashantee Deupree L. Keldan Eplin, D.O. Geriatrics [facility] [address], [place] [address] Cell Phone (Mon-Fri 8am-5pm):  [phone] On Call:  [phone] & follow prompts after 5pm & weekends Office Phone:  [phone] Office Fax:  [phone]    This encounter was created in error - please disregard.

## 2014-12-29 ENCOUNTER — Non-Acute Institutional Stay: Payer: Medicare Other | Admitting: Internal Medicine

## 2014-12-29 ENCOUNTER — Encounter: Payer: Medicare Other | Admitting: Internal Medicine

## 2014-12-29 ENCOUNTER — Encounter: Payer: Self-pay | Admitting: Internal Medicine

## 2014-12-29 VITALS — BP 100/62 | HR 72 | Temp 97.4°F | Wt 151.0 lb

## 2014-12-29 DIAGNOSIS — G309 Alzheimer's disease, unspecified: Secondary | ICD-10-CM

## 2014-12-29 DIAGNOSIS — F0280 Dementia in other diseases classified elsewhere without behavioral disturbance: Secondary | ICD-10-CM

## 2014-12-29 DIAGNOSIS — Z66 Do not resuscitate: Secondary | ICD-10-CM | POA: Diagnosis not present

## 2014-12-29 DIAGNOSIS — J159 Unspecified bacterial pneumonia: Secondary | ICD-10-CM | POA: Diagnosis not present

## 2014-12-29 DIAGNOSIS — S42212S Unspecified displaced fracture of surgical neck of left humerus, sequela: Secondary | ICD-10-CM

## 2014-12-29 DIAGNOSIS — R918 Other nonspecific abnormal finding of lung field: Secondary | ICD-10-CM | POA: Diagnosis not present

## 2014-12-29 DIAGNOSIS — R634 Abnormal weight loss: Secondary | ICD-10-CM | POA: Diagnosis not present

## 2014-12-29 DIAGNOSIS — R2681 Unsteadiness on feet: Secondary | ICD-10-CM | POA: Diagnosis not present

## 2014-12-29 NOTE — Progress Notes (Signed)
Patient ID: [name], female   DOB: [birth year], 81 y.o.   MRN: [id number]   Location:  [facility]  Code Status: DNR  Goals of Care:Advanced Directive information Does patient have an advance directive?: Yes, Type of Advance Directive: Healthcare Power of [place];Living will;Out of facility DNR (pink MOST or yellow form), Pre-existing out of facility DNR order (yellow form or pink MOST form): Yellow form placed in chart (order not valid for inpatient use)  Chief Complaint  Patient presents with  . Hospitalization Follow-up    Pneumoniia    Here with care giver Osborne Casco  . Edema    right lower leg and ankle   . crackles    right lung per nurse    HPI: Patient is a 81 y.o. white female seen in the [facility] today for hospital f/u from discharge date 12/17/14 (<14 d ago).  She has been treated conservatively [facility] [place] for her lung mass (had been treated for pneumonia  w/o resolution of infiltrate-appearing area on her CXR/CT).  She was sent out on Fri, 12/15/14 for increased shortness of breath, fever, cough and lethargy.  She's been fatigued, not eating well, losing weight, and staying in bed for the past several months so those findings were not new.   Her CXR showed "Persistent opacity in the periphery of the right upper lobe since January, 2016. While this could represent pneumonia given the patient's symptoms, infiltrating neoplasm such as adenocarcinoma remains within the differential particularly given its chronicity."    She was treated with IV abx (which one(s) is not mentioned in notes or d/c summary), bronchodilators.  Her initial wbc was 19.  All cultures at the hospital were unremarkable.  A f/u CXR was recommended in 4 wks.  F/u cbc was done and wbc improved from 19 to 13 here at [facility] on 7/11.  She has actually been doing better since her return with increased activity, walking with  [place] and participating in PT, [place].  She is eating more than she was  pre-hospitalization.      Right third fingernail was broken down low and swollen and tender--no significant erythema, monitor.  Bilateral leg swelling of feet and ankles worse since hospitalization, but she has been up walking some with [place] whereas before she went to the hospital, she'd been in bed 90% of the time, but she does not use her walker as directed--reminded them of the importance of this so she remembers to use it if she ever does attempt to walk without another person around.  Right more swollen than left.  She is doing PT, OT.     Shoulder:  Left still hurts her since original fracture.  Doing therapy on this as well.      Review of Systems:  Review of Systems  Constitutional: Positive for malaise/fatigue. Negative for fever and chills.  HENT: Negative for congestion.   Eyes:       Wears glasses  Respiratory: Negative for cough, shortness of breath and wheezing.   Cardiovascular: Negative for chest pain.  Gastrointestinal: Negative for abdominal pain and constipation.  Genitourinary: Negative for dysuria, urgency and frequency.  Musculoskeletal: Negative for falls.  Skin: Negative for rash.  Neurological: Positive for weakness. Negative for dizziness.  Psychiatric/Behavioral: Positive for depression and memory loss.    Past Medical History  Diagnosis Date  . Gastroesophageal reflux disease   . Atrial fibrillation 2013    a. Dx 02/2012, had NSTEMI at  that time. Rate control/anticoag.  . Alzheimer's dementia   . Endometrial hyperplasia with atypia 1995  . Personal history of colonic polyps 2000  . Hyperlipidemia   . Hypertension   . Macular degeneration   . Osteoarthritis   . Osteopenia   . NSTEMI (non-ST elevated myocardial infarction) 02/2012    a. Cath: large filling defect apical LAD suggestive of embolus - LM, RCA, LCx OK. Cath complicated by delirium  . Insomnia 11/16/2012  . Coronary embolism     a. 02/2012 with NSTEMI (see above).  . Cardiomyopathy     a. EF  45% by cath 02/2012, 30% by echocardiogram.    Past Surgical History  Procedure Laterality Date  . Appendectomy  1977  . Abdominal hysterectomy  1995    TAH/BSO  . Left heart catheterization with coronary angiogram N/A 02/19/2012    Procedure: LEFT HEART CATHETERIZATION WITH CORONARY ANGIOGRAM;  Surgeon: Tonny Bollman, MD;  Location: [facility] CATH LAB;  Service: Cardiovascular;  Laterality: N/A;    Social History:   reports that she quit smoking about 42 years ago. Her smoking use included Cigarettes. She has never used smokeless tobacco. She reports that she drinks about 1.2 oz of alcohol per week. She reports that she does not use illicit drugs.  Allergies  Allergen Reactions  . Codeine Other (See Comments)    Unknown reaction Per MAR     Medications: Patient's Medications  New Prescriptions   No medications on file  Previous Medications   ACETAMINOPHEN (TYLENOL) 325 MG TABLET    Take 650 mg by mouth daily.   ASPIRIN 81 MG TABLET    Take 81 mg by mouth daily.    ATENOLOL (TENORMIN) 50 MG TABLET    Take 50 mg by mouth daily with breakfast.    BUPROPION (WELLBUTRIN) 75 MG TABLET    Take 75 mg by mouth every morning.   DILTIAZEM (DILACOR XR) 240 MG 24 HR CAPSULE    Take one tablet by mouth once daily   FEEDING SUPPLEMENT, ENSURE ENLIVE, (ENSURE ENLIVE) LIQD    Take 237 mLs by mouth every morning.   IPRATROPIUM-ALBUTEROL (DUONEB) 0.5-2.5 (3) MG/3ML SOLN    Take 3 mLs by nebulization every 6 (six) hours as needed.   MELATONIN 10 MG CAPS    Take 1 capsule by mouth at bedtime.   MEMANTINE (NAMENDA XR) 28 MG CP24 24 HR CAPSULE    Take 28 mg by mouth daily with breakfast.   MULTIPLE VITAMINS-MINERALS (PRESERVISION AREDS 2 PO)    Take 1 capsule by mouth 2 (two) times daily.   ONDANSETRON (ZOFRAN) 4 MG TABLET    Take 4 mg by mouth 2 (two) times daily with a meal.  Modified Medications   No medications on file  Discontinued Medications   LEVOFLOXACIN (LEVAQUIN) 750 MG TABLET    Take 1 tablet  (750 mg total) by mouth daily.   PREDNISONE (STERAPRED UNI-PAK 21 TAB) 10 MG (21) TBPK TABLET    Take 1 tablet (10 mg total) by mouth daily. Prednisone 40 mg daily for 5 days     Physical Exam: Filed Vitals:   12/29/14 0945  BP: 100/62  Pulse: 72  Temp: 97.4 F (36.3 C)  TempSrc: Oral  Weight: 151 lb (68.493 kg)  SpO2: 98%   Body mass index is 24.38 kg/(m^2). Physical Exam  Constitutional: She appears well-developed and well-nourished. No distress.  HENT:  Head: Normocephalic and atraumatic.  Cardiovascular: Normal rate, regular rhythm, normal heart sounds and  intact distal pulses.   Pulmonary/Chest: Effort normal.  Right sided rhonchi  Abdominal: Soft. Bowel sounds are normal. She exhibits no distension. There is no tenderness.  Musculoskeletal: Normal range of motion.  Left arm remains tender in proximal humerus  Neurological: She is alert.  Answers some questions appropriately, but usually responses are incorrect or sarcastic and much of history was obtained from caregiver and nursing staff  Skin: Skin is warm and dry.     Labs reviewed: Basic Metabolic Panel:  Recent Labs  40/98/11 0525 07/12/14 0738 07/20/14 12/15/14 1916  NA 142 138 142 137  K 3.9 3.3* 4.1 3.8  CL 106 103  --  101  CO2 28 25  --  25  GLUCOSE 120* 114*  --  130*  BUN 26* CREATININE 0.75 0.63 0.8 0.90  CALCIUM 8.9 8.8  --  9.2  TSH 1.723  --   --   --    Liver Function Tests:  Recent Labs  07/08/14 2144 07/20/14  AST 17 36*  ALT 13 31  ALKPHOS 64 92  BILITOT 0.6  --   PROT 6.7  --   ALBUMIN 3.6  --    No results for input(s): LIPASE, AMYLASE in the last 8760 hours. No results for input(s): AMMONIA in the last 8760 hours. CBC:  Recent Labs  07/08/14 2144 07/09/14 0525  12/15/14 1916 12/17/14 0439 12/20/14  WBC 12.1* 10.6*  < > 20.5* 19.2* 13.6  NEUTROABS 9.1*  --   --  18.6*  --   --   HGB 12.4 11.9*  < > 13.6 13.4 13.5  HCT 38.7 37.5  < > 41.2 41.1 41  MCV  93.5 93.5  --  91.4 92.6  --   PLT 204 249  < > 226 223 324  < > = values in this interval not displayed. Lipid Panel: No results for input(s): CHOL, HDL, LDLCALC, TRIG, CHOLHDL, LDLDIRECT in the last 8760 hours. No results found for: HGBA1C  Procedures since last appt: CXR from ED reviewed--see hpi  Patient Care Team: Kermit Balo, DO as PCP - General (Geriatric Medicine) Lewayne Bunting, MD as Consulting Physician (Cardiology) [facility]  Assessment/Plan 1. Healthcare associated bacterial pneumonia -seems this is nearly resolved -wbc improved, will recheck once more for normalization (last 13) -has f/u cxr in 4 wks from last one to check for resolution this time--unclear what abx was used at hospital  2. Lung mass -RUL--this is the same area where the lesion has been the entire time suggesting her pneumonia is postobstructive -await f/u cxr in 4 wks -her family does not want her to know about the mass and they do not want her to be put through treatments for it  3. Alzheimer disease -moderate stages -is on namenda XR, off aricept due to weight loss and potential gi side effects -requires prompting for adl care and lives in [place] where meds are administered, has 24hr caregivers -is fall risk and should use her walker  4. Loss of weight -down 13 lbs in 6 mos -staff are continuously encouraging her to eat and getting her exactly what she wants so this is helping her maintain her weight despite her poor appetite   5. Humeral surgical neck fracture, left, sequela -continues working with OT on this, pain controlled with tylenol and stronger agents worsen her confusion  6. Gait instability -ongoing, is to be using her walker not walking with caregivers w/o assistive  device b/c she could fall and both of them may fall down; plus, she may try to walk w/o someone right there with her and fall when she does not have her walker -this is primarily due to debility  and progressing dementia  7. Advance directive indicates patient wish for do-not-resuscitate status - code status reviewed and remains DNR - DNR (Do Not Resuscitate)  Labs/tests ordered:  F/u cbc, CXR Next appt:  3 mos  Mikal Wisman L. Rhiley Tarver, D.O. Geriatrics [facility] [address], [place] [address] Cell Phone (Mon-Fri 8am-5pm):  [phone] On Call:  [phone] & follow prompts after 5pm & weekends Office Phone:  [phone] Office Fax:  [phone]

## 2015-01-12 ENCOUNTER — Non-Acute Institutional Stay: Payer: Medicare Other | Admitting: Internal Medicine

## 2015-01-12 ENCOUNTER — Encounter: Payer: Self-pay | Admitting: Internal Medicine

## 2015-01-12 VITALS — BP 122/70 | HR 88 | Temp 97.6°F | Resp 16 | Ht 66.0 in | Wt 148.0 lb

## 2015-01-12 DIAGNOSIS — J159 Unspecified bacterial pneumonia: Secondary | ICD-10-CM | POA: Diagnosis not present

## 2015-01-12 DIAGNOSIS — G309 Alzheimer's disease, unspecified: Secondary | ICD-10-CM | POA: Diagnosis not present

## 2015-01-12 DIAGNOSIS — Z23 Encounter for immunization: Secondary | ICD-10-CM

## 2015-01-12 DIAGNOSIS — I480 Paroxysmal atrial fibrillation: Secondary | ICD-10-CM

## 2015-01-12 DIAGNOSIS — R2681 Unsteadiness on feet: Secondary | ICD-10-CM | POA: Diagnosis not present

## 2015-01-12 DIAGNOSIS — I252 Old myocardial infarction: Secondary | ICD-10-CM

## 2015-01-12 DIAGNOSIS — S42212S Unspecified displaced fracture of surgical neck of left humerus, sequela: Secondary | ICD-10-CM | POA: Diagnosis not present

## 2015-01-12 DIAGNOSIS — R918 Other nonspecific abnormal finding of lung field: Secondary | ICD-10-CM | POA: Diagnosis not present

## 2015-01-12 DIAGNOSIS — F0280 Dementia in other diseases classified elsewhere without behavioral disturbance: Secondary | ICD-10-CM

## 2015-01-12 NOTE — Progress Notes (Signed)
Patient ID: [name], female   DOB: 09-27-1921, 81 y.o.   MRN: [id number]   Location:  [facility]  Code Status: DNR  Goals of Care:Advanced Directive information Does patient have an advance directive?: Yes, Type of Advance Directive: Healthcare Power of [place];Living will;Out of facility DNR (pink MOST or yellow form), Pre-existing out of facility DNR order (yellow form or pink MOST form): Yellow form placed in chart (order not valid for inpatient use), Does patient want to make changes to advanced directive?: No - Patient declined  Chief Complaint  Patient presents with  . Annual Exam    Yearly check-up, EKG completed 12/16/14. MMSE completed July 2016 (13/30)Here with Dennie Bible   . Medical Management of Chronic Issues    HPI: Patient is a 81 y.o. white female seen in the [facility] today for her annual exam.  We attempted to examine her on the exam table, but she was sliding off so we had to finish her exam in her wheelchair.    She had no complaints today.  Her caregiver, Dennie Bible, was with her and provided some history, as well.  She reports that Mrs. Keidel has done well with PT walking down the hall with her walker and now is walking down the hall with her caregiver daily.  They rest at the end before coming back.  She also is doing very well working with OT.    She admits to going difficulty with her left upper arm hurting.  Says she thinks she is stuck with that pain.  She is still taking a prolonged am nap and another one in the afternoon, but is staying up more than she did before her last pneumonia hospitalization.  Her wbc count has come down.  She is eating better but still not 100% (more like 25-75).  She was somewhat irritable at first but brightened up as we spoke.  She keeps drifting back to when she lived in [place] and referring to that.  She has not recollection of having pneumonia.    She agreed to her prevnar vaccine today which was given. Immunization  History  Administered Date(s) Administered  . Influenza Whole 03/11/2012, 04/10/2013  . Influenza-Unspecified 03/16/2014  . Pneumococcal Conjugate-13 01/12/2015  . Pneumococcal Polysaccharide-23 06/11/2006  . Td 06/11/2010  . Zoster 06/11/2004   Fall Risk  01/12/2015 09/02/2013 12/01/2012  Falls in the past year? Yes No No  Number falls in past yr: 2 or more - -  Injury with Fall? Yes - -  Risk Factor Category  High Fall Risk - -  Risk for fall due to : History of fall(s);Impaired balance/gait;Impaired mobility;Mental status change;Impaired vision - -  Follow up Education provided - -   Depression screen [facility] 2/9 01/12/2015 09/02/2013 12/01/2012  Decreased Interest 0 0 0  Down, Depressed, Hopeless 0 0 0  PHQ - 2 Score 0 0 0  She is on medication for depression  MMSE - Mini Mental State Exam 12/22/2014  Orientation to time 0  Orientation to Place 5  Registration 0  Attention/ Calculation 0  Recall 0  Language- name 2 objects 2  Language- repeat 1  Language- follow 3 step command 3  Language- read & follow direction 1  Write a sentence 1  Copy design 0  Total score 13  failed clock  Functional status:  Requires assistance with meals but can feed herself.  Requires prompting with bathing, dressing, grooming and toileting.  Remains continent most of the  time.  Review of Systems:  Review of Systems  Constitutional: Positive for malaise/fatigue. Negative for fever and chills.  HENT: Positive for hearing loss. Negative for congestion.        But says she can hear just fine  Eyes: Positive for blurred vision.       Glasses  Respiratory: Negative for cough and shortness of breath.   Cardiovascular: Positive for leg swelling. Negative for chest pain.       Edema better today  Gastrointestinal: Positive for constipation. Negative for heartburn, nausea, vomiting, abdominal pain, blood in stool and melena.  Genitourinary: Negative for dysuria and hematuria.  Musculoskeletal: Positive for  joint pain and falls. Negative for myalgias.  Skin: Negative for rash.  Neurological: Positive for dizziness and weakness. Negative for loss of consciousness.       Off and on  Endo/Heme/Allergies: Bruises/bleeds easily.  Psychiatric/Behavioral: Positive for depression and memory loss. The patient has insomnia.     Past Medical History  Diagnosis Date  . Gastroesophageal reflux disease   . Atrial fibrillation 2013    a. Dx 02/2012, had NSTEMI at that time. Rate control/anticoag.  . Alzheimer's dementia   . Endometrial hyperplasia with atypia 1995  . Personal history of colonic polyps 2000  . Hyperlipidemia   . Hypertension   . Macular degeneration   . Osteoarthritis   . Osteopenia   . NSTEMI (non-ST elevated myocardial infarction) 02/2012    a. Cath: large filling defect apical LAD suggestive of embolus - LM, RCA, LCx OK. Cath complicated by delirium  . Insomnia 11/16/2012  . Coronary embolism     a. 02/2012 with NSTEMI (see above).  . Cardiomyopathy     a. EF 45% by cath 02/2012, 30% by echocardiogram.    Past Surgical History  Procedure Laterality Date  . Appendectomy  1977  . Abdominal hysterectomy  1995    TAH/BSO  . Left heart catheterization with coronary angiogram N/A 02/19/2012    Procedure: LEFT HEART CATHETERIZATION WITH CORONARY ANGIOGRAM;  Surgeon: Tonny Bollman, MD;  Location: [facility] CATH LAB;  Service: Cardiovascular;  Laterality: N/A;    Social History:   reports that she quit smoking about 42 years ago. Her smoking use included Cigarettes. She has never used smokeless tobacco. She reports that she drinks about 1.2 oz of alcohol per week. She reports that she does not use illicit drugs. Family History  Problem Relation Age of Onset  . Heart disease Father     Allergies  Allergen Reactions  . Codeine Other (See Comments)    Unknown reaction Per MAR     Medications: Patient's Medications  New Prescriptions   No medications on file  Previous Medications    ACETAMINOPHEN (TYLENOL) 325 MG TABLET    Take 650 mg by mouth daily.   ASPIRIN 81 MG TABLET    Take 81 mg by mouth daily.    ATENOLOL (TENORMIN) 50 MG TABLET    Take 50 mg by mouth daily with breakfast.    BUPROPION (WELLBUTRIN) 75 MG TABLET    Take 150 mg by mouth every morning.    CHOLECALCIFEROL (VITAMIN D) 1000 UNITS TABLET    Take 1,000 Units by mouth daily.   DILTIAZEM (DILACOR XR) 240 MG 24 HR CAPSULE    Take one tablet by mouth once daily   FEEDING SUPPLEMENT, ENSURE ENLIVE, (ENSURE ENLIVE) LIQD    Take 237 mLs by mouth every morning.   IPRATROPIUM-ALBUTEROL (DUONEB) 0.5-2.5 (3) MG/3ML SOLN  Take 3 mLs by nebulization every 6 (six) hours as needed.   MELATONIN 10 MG CAPS    Take 1 capsule by mouth at bedtime.   MEMANTINE (NAMENDA XR) 28 MG CP24 24 HR CAPSULE    Take 28 mg by mouth daily with breakfast.   MULTIPLE VITAMINS-MINERALS (PRESERVISION AREDS 2 PO)    Take 1 capsule by mouth 2 (two) times daily.   ONDANSETRON (ZOFRAN) 4 MG TABLET    Take 4 mg by mouth 2 (two) times daily with a meal.  Modified Medications   No medications on file  Discontinued Medications   No medications on file   Physical Exam: Filed Vitals:   01/12/15 0948  BP: 122/70  Pulse: 88  Temp: 97.6 F (36.4 C)  TempSrc: Oral  Resp: 16  Height: 5\' 6"  (1.676 m)  Weight: 148 lb (67.132 kg)  SpO2: 92%   Body mass index is 23.9 kg/(m^2). Physical Exam  Constitutional: She appears well-nourished. No distress.  HENT:  Head: Normocephalic and atraumatic.  Right Ear: External ear normal.  Left Ear: External ear normal.  Nose: Nose normal.  Mouth/Throat: Oropharynx is clear and moist. No oropharyngeal exudate.  Eyes: Conjunctivae and EOM are normal. Pupils are equal, round, and reactive to light.  glasses  Neck: Normal range of motion. Neck supple. No JVD present. No tracheal deviation present. No thyromegaly present.  Cardiovascular: Normal rate, regular rhythm and intact distal pulses.     Pulmonary/Chest: Effort normal and breath sounds normal. No respiratory distress. Right breast exhibits no inverted nipple, no mass, no nipple discharge, no skin change and no tenderness. Left breast exhibits no inverted nipple, no mass, no nipple discharge, no skin change and no tenderness.  No coarse rhonchi audible today  Abdominal: Soft. Bowel sounds are normal. She exhibits no distension. There is no tenderness.  Musculoskeletal: Normal range of motion.  Neurological: She is alert.  Oriented to person only  Skin: Skin is warm and dry.  Psychiatric:  Affect normal for her     Labs reviewed: Basic Metabolic Panel:  Recent Labs  16/10/96 0525 07/12/14 0738 07/20/14 12/15/14 1916  NA 142 138 142 137  K 3.9 3.3* 4.1 3.8  CL 106 103  --  101  CO2 28 25  --  25  GLUCOSE 120* 114*  --  130*  BUN 26* 21 17 17   CREATININE 0.75 0.63 0.8 0.90  CALCIUM 8.9 8.8  --  9.2  TSH 1.723  --   --   --    Liver Function Tests:  Recent Labs  07/08/14 2144 07/20/14  AST 17 36*  ALT 13 31  ALKPHOS 64 92  BILITOT 0.6  --   PROT 6.7  --   ALBUMIN 3.6  --    No results for input(s): LIPASE, AMYLASE in the last 8760 hours. No results for input(s): AMMONIA in the last 8760 hours. CBC:  Recent Labs  07/08/14 2144 07/09/14 0525  12/15/14 1916 12/17/14 0439 12/20/14  WBC 12.1* 10.6*  < > 20.5* 19.2* 13.6  NEUTROABS 9.1*  --   --  18.6*  --   --   HGB 12.4 11.9*  < > 13.6 13.4 13.5  HCT 38.7 37.5  < > 41.2 41.1 41  MCV 93.5 93.5  --  91.4 92.6  --   PLT 204 249  < > 226 223 324  < > = values in this interval not displayed.  Patient Care Team: Kermit Balo, DO  as PCP - General (Geriatric Medicine) Lewayne Bunting, MD as Consulting Physician (Cardiology) [facility]  Assessment/Plan 1. Healthcare associated bacterial pneumonia -seems this has cleared -wbc improved to 9 from as high as 20 -has f/u cxr on 8/8 it appears in her chart  2. Lung mass -f/u  cxr on 8/8 -had been losing weight and declining overall, but doing quite a bit better since her latest pneumonia treatment  3. Alzheimer disease -last mmse 13/30 failing clock -moderate stages--requires adl prompts, but can then perform most  -she has very little short term memory and perseverates on things from the past -continues on namenda XR; no aricept due to weight loss and gi upset  4. Humeral surgical neck fracture, left, sequela -ongoing discomfort, cont tylenol daily and OT  5. Gait instability -ongoing, but improved some with therapy--keep walking with caregivers WITH walker  6. History of non-ST elevation myocardial infarction (NSTEMI) -seems it took a long time to recover from this in the context of her previous pneumonia--suspect this has contributed to her decline overall, as well -f/u lipid panel, cont baby asa  7. Paroxysmal a-fib -cont diltiazem, atenolol, baby asa--falls and very unsteady so no other anticoagulation is appropriate  8. Need for vaccination with 13-polyvalent pneumococcal conjugate vaccine - Pneumococcal conjugate vaccine 13-valent--prevnar given  Labs/tests ordered:  Cbc, cmp, flp before Next appt:  3 mos med mgt  Dejane Scheibe L. Katharina Jehle, D.O. Geriatrics [facility] [address], [place] [address] Cell Phone (Mon-Fri 8am-5pm):  [phone] On Call:  [phone] & follow prompts after 5pm & weekends Office Phone:  [phone] Office Fax:  [phone]

## 2015-01-12 NOTE — Progress Notes (Signed)
Refused clock test. Test was performed by [facility] Staff

## 2015-01-17 ENCOUNTER — Ambulatory Visit
Admission: RE | Admit: 2015-01-17 | Discharge: 2015-01-17 | Disposition: A | Discharge status: Home / Self Care | Payer: Medicare Other | Source: Ambulatory Visit | Attending: Internal Medicine | Admitting: Internal Medicine

## 2015-01-17 ENCOUNTER — Other Ambulatory Visit: Payer: Self-pay | Admitting: Internal Medicine

## 2015-01-17 DIAGNOSIS — Z09 Encounter for follow-up examination after completed treatment for conditions other than malignant neoplasm: Secondary | ICD-10-CM

## 2015-01-17 DIAGNOSIS — J189 Pneumonia, unspecified organism: Secondary | ICD-10-CM

## 2015-03-01 ENCOUNTER — Telehealth: Payer: Self-pay | Admitting: *Deleted

## 2015-03-01 NOTE — Telephone Encounter (Signed)
Please type this letter and I will sign it Thursday.

## 2015-03-01 NOTE — Telephone Encounter (Signed)
Larita Fife, Caregiver called and stated that she needs a letter for her CPA confirming patient needs 24/7 care and has a fall risk and dementia. This is so that it can deduct some of her medical expenses. Please Advise.

## 2015-03-03 ENCOUNTER — Telehealth: Payer: Self-pay

## 2015-03-03 NOTE — Telephone Encounter (Signed)
Letter typed and given to Dr. Renato Gails for review and signing.

## 2015-03-03 NOTE — Telephone Encounter (Signed)
Spoke with daughter Lyn, will mail the letter to her regarding around the clock care. Mail to [address] [place] , [place] [address]

## 2015-04-05 LAB — BASIC METABOLIC PANEL
BUN: 25 mg/dL — AB (ref 4–21)
CREATININE: 1 mg/dL (ref 0.5–1.1)
Glucose: 90 mg/dL
Potassium: 4.1 mmol/L (ref 3.4–5.3)
Sodium: 144 mmol/L (ref 137–147)

## 2015-04-05 LAB — LIPID PANEL
Cholesterol: 240 mg/dL — AB (ref 0–200)
HDL: 50 mg/dL (ref 35–70)
LDL Cholesterol: 168 mg/dL
Triglycerides: 112 mg/dL (ref 40–160)

## 2015-04-05 LAB — HEPATIC FUNCTION PANEL
ALT: 14 U/L (ref 7–35)
AST: 15 U/L (ref 13–35)
Alkaline Phosphatase: 69 U/L (ref 25–125)
BILIRUBIN, TOTAL: 0.5 mg/dL

## 2015-04-05 LAB — CBC AND DIFFERENTIAL
HCT: 40 % (ref 36–46)
Hemoglobin: 12.8 g/dL (ref 12.0–16.0)
Platelets: 253 10*3/uL (ref 150–399)
WBC: 9.2 10^3/mL

## 2015-04-13 ENCOUNTER — Non-Acute Institutional Stay: Payer: Medicare Other | Admitting: Internal Medicine

## 2015-04-13 ENCOUNTER — Encounter: Payer: Self-pay | Admitting: Internal Medicine

## 2015-04-13 VITALS — BP 100/62 | HR 60 | Temp 97.7°F | Wt 147.0 lb

## 2015-04-13 DIAGNOSIS — I252 Old myocardial infarction: Secondary | ICD-10-CM | POA: Diagnosis not present

## 2015-04-13 DIAGNOSIS — N390 Urinary tract infection, site not specified: Secondary | ICD-10-CM

## 2015-04-13 DIAGNOSIS — I482 Chronic atrial fibrillation, unspecified: Secondary | ICD-10-CM

## 2015-04-13 DIAGNOSIS — J189 Pneumonia, unspecified organism: Secondary | ICD-10-CM

## 2015-04-13 DIAGNOSIS — G309 Alzheimer's disease, unspecified: Secondary | ICD-10-CM | POA: Diagnosis not present

## 2015-04-13 DIAGNOSIS — R918 Other nonspecific abnormal finding of lung field: Secondary | ICD-10-CM | POA: Diagnosis not present

## 2015-04-13 DIAGNOSIS — R634 Abnormal weight loss: Secondary | ICD-10-CM | POA: Diagnosis not present

## 2015-04-13 DIAGNOSIS — I10 Essential (primary) hypertension: Secondary | ICD-10-CM | POA: Diagnosis not present

## 2015-04-13 DIAGNOSIS — F0280 Dementia in other diseases classified elsewhere without behavioral disturbance: Secondary | ICD-10-CM

## 2015-04-13 NOTE — Progress Notes (Signed)
Location:  [facility]  Code Status: DNR Goals of Care: Advanced Directive information Does patient have an advance directive?: Yes, Type of Advance Directive: Healthcare Power of [place];Living will;Out of facility DNR (pink MOST or yellow form), Pre-existing out of facility DNR order (yellow form or pink MOST form): Yellow form placed in chart (order not valid for inpatient use)   Chief Complaint  Patient presents with  . Medical Management of Chronic Issues    Alzheimer, lung mass, blood pressure    HPI: Patient is a 81 y.o. female seen in the office today for medical management of chronic issues.   She says she is doing well. Doing the same thing over and over. Has recovered well from pneumonia. WBC is 9.2.  She is walking in halls at times, and doing arm exercises with caregivers in her room. She is weak and wobbly, and is afraid of falling. She would like to be more active but she is again worried about falling. She does have a [facility] caregiver with her 24/7. She requires assistance with bathing, dressing, getting her meals, but she able to feed herself.  She also has a private caregiver that takes her out for rides.   She has recovered from UTI. No incontinence or other urinary symptoms.   She is not eating well because she does not like the food. She has lost one pound down to 147 today from 148 in August. Protein was a tad low at 6. She does get a Boost supplement every day, but she isn't sure drinks it.   Cholesterol was elevated at 240, but LDL was 168 and HDL was 50. She doesn't like the vegetables that are served, and she is tired of eating the same thing over and over.   Review of Systems:  Review of Systems  Constitutional: Negative for fever, chills and weight loss.  Respiratory: Positive for cough. Negative for wheezing.   Cardiovascular: Negative for chest pain, palpitations and leg swelling.  Gastrointestinal: Negative for nausea, vomiting, abdominal  pain, diarrhea and constipation.  Genitourinary: Negative for dysuria, urgency, frequency, hematuria and flank pain.  Neurological: Negative for dizziness, tremors, focal weakness and headaches.  Psychiatric/Behavioral: Positive for memory loss. Negative for depression.       "growly at times"    Past Medical History  Diagnosis Date  . Gastroesophageal reflux disease   . Atrial fibrillation (HCC) 2013    a. Dx 02/2012, had NSTEMI at that time. Rate control/anticoag.  . Alzheimer's dementia   . Endometrial hyperplasia with atypia 1995  . Personal history of colonic polyps 2000  . Hyperlipidemia   . Hypertension   . Macular degeneration   . Osteoarthritis   . Osteopenia   . NSTEMI (non-ST elevated myocardial infarction) (HCC) 02/2012    a. Cath: large filling defect apical LAD suggestive of embolus - LM, RCA, LCx OK. Cath complicated by delirium  . Insomnia 11/16/2012  . Coronary embolism (HCC)     a. 02/2012 with NSTEMI (see above).  . Cardiomyopathy (HCC)     a. EF 45% by cath 02/2012, 30% by echocardiogram.    Past Surgical History  Procedure Laterality Date  . Appendectomy  1977  . Abdominal hysterectomy  1995    TAH/BSO  . Left heart catheterization with coronary angiogram N/A 02/19/2012    Procedure: LEFT HEART CATHETERIZATION WITH CORONARY ANGIOGRAM;  Surgeon: Tonny Bollman, MD;  Location: [facility] CATH LAB;  Service: Cardiovascular;  Laterality: N/A;    Allergies  Allergen Reactions  . Codeine Other (See Comments)    Unknown reaction Per MAR    Medications: Patient's Medications  New Prescriptions   No medications on file  Previous Medications   ACETAMINOPHEN (TYLENOL) 325 MG TABLET    Take 650 mg by mouth daily.   ASPIRIN 81 MG TABLET    Take 81 mg by mouth daily.    ATENOLOL (TENORMIN) 50 MG TABLET    Take 50 mg by mouth daily with breakfast.    BUPROPION (WELLBUTRIN) 75 MG TABLET    Take 150 mg by mouth every morning.    DILTIAZEM (DILACOR XR) 240 MG 24 HR CAPSULE     Take one tablet by mouth once daily   FEEDING SUPPLEMENT, ENSURE ENLIVE, (ENSURE ENLIVE) LIQD    Take 237 mLs by mouth every morning.   IPRATROPIUM-ALBUTEROL (DUONEB) 0.5-2.5 (3) MG/3ML SOLN    Take 3 mLs by nebulization every 6 (six) hours as needed.   MELATONIN 10 MG CAPS    Take 1 capsule by mouth at bedtime.   MEMANTINE (NAMENDA XR) 28 MG CP24 24 HR CAPSULE    Take 28 mg by mouth daily with breakfast.   MULTIPLE VITAMINS-MINERALS (PRESERVISION AREDS 2 PO)    Take 1 capsule by mouth 2 (two) times daily.   ONDANSETRON (ZOFRAN) 4 MG TABLET    Take 4 mg by mouth 2 (two) times daily with a meal.  Modified Medications   No medications on file  Discontinued Medications   CHOLECALCIFEROL (VITAMIN D) 1000 UNITS TABLET    Take 1,000 Units by mouth daily.    Physical Exam: Filed Vitals:   04/13/15 1112  BP: 100/62  Pulse: 60  Temp: 97.7 F (36.5 C)  TempSrc: Oral  Weight: 147 lb (66.679 kg)  SpO2: 97%   Physical Exam  Constitutional: She appears well-developed and well-nourished. No distress.  HENT:  Head: Normocephalic and atraumatic.  Eyes: Conjunctivae are normal. Right eye exhibits no discharge. Left eye exhibits no discharge.  Tearing noted  Neck: Normal range of motion. Neck supple.  Cardiovascular: Normal rate.  Exam reveals no gallop and no friction rub.   No murmur heard. Irregular rhythm, soft heart sounds  Pulmonary/Chest: Effort normal. She has wheezes ( r upper field). She has rales ( r upper field).  Abdominal: Soft. Bowel sounds are normal. She exhibits no distension.  Musculoskeletal: Normal range of motion. She exhibits no edema or tenderness.  Seated in wheelchair, good BUE strength, weakness in BLE  Neurological: She is alert.  Skin: Skin is warm and dry.  Psychiatric: She has a normal mood and affect. Her behavior is normal.  Pleasant, Able to stay in the present today, answers questions appropriately    Labs reviewed: Basic Metabolic Panel:  Recent Labs   07/09/14 0525 07/12/14 0738 07/20/14 12/15/14 1916 04/05/15  NA 142 138 142 137 144  K 3.9 3.3* 4.1 3.8 4.1  CL 106 103  --  101  --   CO2 28 25  --  25  --   GLUCOSE 120* 114*  --  130*  --   BUN 26* 25*  CREATININE 0.75 0.63 0.8 0.90 1.0  CALCIUM 8.9 8.8  --  9.2  --   TSH 1.723  --   --   --   --    Liver Function Tests:  Recent Labs  07/08/14 2144 07/20/14 04/05/15  AST 17 36* 15  ALT ALKPHOS 64 92  69  BILITOT 0.6  --   --   PROT 6.7  --   --   ALBUMIN 3.6  --   --      CBC:  Recent Labs  07/08/14 2144 07/09/14 0525  12/15/14 1916 12/17/14 0439 12/20/14 04/05/15  WBC 12.1* 10.6*  < > 20.5* 19.2* 13.6 9.2  NEUTROABS 9.1*  --   --  18.6*  --   --   --   HGB 12.4 11.9*  < > 13.6 13.4 13.5 12.8  HCT 38.7 37.5  < > 41.2 41.1 41 40  MCV 93.5 93.5  --  91.4 92.6  --   --   PLT 204 249  < > 226 223 324 253  < > = values in this interval not displayed. Lipid Panel:  Recent Labs  04/05/15  CHOL 240*  HDL 50  LDLCALC 168  TRIG 161    Assessment/Plan  1. Alzheimer disease Stable. No evidence of progression of decline since last visit. She now has 24/7 care assisting her with ADL's, monitoring meals and providing general assistance throughout the day and night. Continue Namenda XR. Aricept was stopped due to weight loss.   2. HCAP (healthcare-associated pneumonia) Resolved. WBC has normalized to 9.2. No shortness of breath or difficultites breathing. Last chest xray did note persistent infiltrates and recommended followup xray, but patient and family have declined due to her lack of symptoms, advanced age, and Alzheimer disease.   3. Essential hypertension Controlled. BP stable today at 100/62. This may be a little low. Last BP was 122/70 at last visit. She is not symptomatic of hypotension. Will continue to monitor and may need to adjust meds if low readings persist. Continue current atenolol 50 mg daily and diltiazem once daily.  4. Chronic  atrial fibrillation (HCC) Persists. Irregular on ausculation today. She is not a candidate for anticoagulation due to increased risk of falls. Continue on rate control with atenolol and diltiazem.    5. Urinary tract infection without hematuria, site unspecified Resolved. Will continue to monitor for recurrence.   6. Loss of weight This has stabilized- no further loss since August. This is likely related to her increase in level of care. Serum protein was slightly low at 6. Encouraged caregiver to push patient to drink her boost and increase dietary protein.   7. Lung mass-  Per chest xray underlying persistent inflitrates. No SIADH noted. She is not currently symptomatic. Further follow up and investigation have been declined by patient and family.   8. History of non-ST elevation myocardial infarction (NSTEMI) Total cholesterol was elevated, but LDL and HDL were stable. Statin therapy is not indicated. Continue aspirin 81 mg daily.  Next appt: Follow up with Dr. Renato Gails in March for medical management of chronic issues.   Ocie Bob, RN, BSN Geriatrics [facility] [phone] [address], [place] [address] Office Phone:  [phone] Office Fax:  [phone]

## 2015-07-26 ENCOUNTER — Encounter: Payer: Self-pay | Admitting: Internal Medicine

## 2015-07-26 ENCOUNTER — Non-Acute Institutional Stay: Payer: Medicare Other | Admitting: Internal Medicine

## 2015-07-26 DIAGNOSIS — R05 Cough: Secondary | ICD-10-CM | POA: Diagnosis not present

## 2015-07-26 DIAGNOSIS — F0280 Dementia in other diseases classified elsewhere without behavioral disturbance: Secondary | ICD-10-CM

## 2015-07-26 DIAGNOSIS — Z789 Other specified health status: Secondary | ICD-10-CM | POA: Diagnosis not present

## 2015-07-26 DIAGNOSIS — R059 Cough, unspecified: Secondary | ICD-10-CM

## 2015-07-26 DIAGNOSIS — R634 Abnormal weight loss: Secondary | ICD-10-CM

## 2015-07-26 DIAGNOSIS — G309 Alzheimer's disease, unspecified: Secondary | ICD-10-CM | POA: Diagnosis not present

## 2015-07-26 DIAGNOSIS — R918 Other nonspecific abnormal finding of lung field: Secondary | ICD-10-CM

## 2015-07-26 NOTE — Progress Notes (Signed)
Patient ID: [name], female   DOB: 09/05/1921, 82 y.o.   MRN: [id number]   Location: [facility] Provider: Elmarie Shiley L. Renato Gails, D.O., C.M.D.  Code Status: DNR Goals of Care: Advanced Directive information Advanced Directives 07/26/2015  Does patient have an advance directive? Yes  Type of Estate agent of [place];Living will;Out of facility DNR (pink MOST or yellow form)  Copy of advanced directive(s) in chart? Yes  Pre-existing out of facility DNR order (yellow form or pink MOST form) Yellow form placed in chart (order not valid for inpatient use)     Chief Complaint  Patient presents with  . Acute Visit    cough, congestion    HPI: Patient is a 82 y.o. female seen in the office today for an acute visit for cough and congestion overnight.  She'd been seen breathing with her abdominal muscles per staff.  She received duonebs and robitussin from the on call provider.  A chest xray was done which was negative for pneumonia but did show her chronic infiltrate that is suspected to be malignancy.  Her family has opted not to work this up due to her dementia, age and current functional status.  When seen, she had a slight dry cough.  She was feeling weak and tired and was resting in bed.  She was irritated that people kept checking on her.  Apparently, she'd been up much of the night coughing.  She did not eat well at breakfast this am.    Review of Systems:  Review of Systems  Constitutional: Positive for malaise/fatigue. Negative for fever and chills.  HENT: Negative for congestion.   Respiratory: Positive for cough and shortness of breath. Negative for sputum production.   Cardiovascular: Negative for chest pain and leg swelling.  Gastrointestinal: Negative for abdominal pain, blood in stool and melena.  Genitourinary: Negative for dysuria, urgency and frequency.  Musculoskeletal: Negative for falls.  Skin: Negative for rash.  Neurological: Positive for  weakness. Negative for dizziness.  Psychiatric/Behavioral: Positive for memory loss.    Past Medical History  Diagnosis Date  . Gastroesophageal reflux disease   . Atrial fibrillation (HCC) 2013    a. Dx 02/2012, had NSTEMI at that time. Rate control/anticoag.  . Alzheimer's dementia   . Endometrial hyperplasia with atypia 1995  . Personal history of colonic polyps 2000  . Hyperlipidemia   . Hypertension   . Macular degeneration   . Osteoarthritis   . Osteopenia   . NSTEMI (non-ST elevated myocardial infarction) (HCC) 02/2012    a. Cath: large filling defect apical LAD suggestive of embolus - LM, RCA, LCx OK. Cath complicated by delirium  . Insomnia 11/16/2012  . Coronary embolism (HCC)     a. 02/2012 with NSTEMI (see above).  . Cardiomyopathy (HCC)     a. EF 45% by cath 02/2012, 30% by echocardiogram.    Past Surgical History  Procedure Laterality Date  . Appendectomy  1977  . Abdominal hysterectomy  1995    TAH/BSO  . Left heart catheterization with coronary angiogram N/A 02/19/2012    Procedure: LEFT HEART CATHETERIZATION WITH CORONARY ANGIOGRAM;  Surgeon: Tonny Bollman, MD;  Location: [facility] CATH LAB;  Service: Cardiovascular;  Laterality: N/A;    Allergies  Allergen Reactions  . Codeine Other (See Comments)    Unknown reaction Per [facility]       Medication List       This list is accurate as of: 07/26/15 11:59 PM.  Always use your most  recent med list.               acetaminophen 325 MG tablet  Commonly known as:  TYLENOL  Take 650 mg by mouth daily.     aspirin 81 MG tablet  Take 81 mg by mouth daily.     atenolol 50 MG tablet  Commonly known as:  TENORMIN  Take 50 mg by mouth daily with breakfast.     buPROPion 75 MG tablet  Commonly known as:  WELLBUTRIN  Take 150 mg by mouth every morning.     diltiazem 240 MG 24 hr capsule  Commonly known as:  DILACOR XR  Take one tablet by mouth once daily     feeding supplement (ENSURE ENLIVE) Liqd  Take 237 mLs by  mouth every morning.     ipratropium-albuterol 0.5-2.5 (3) MG/3ML Soln  Commonly known as:  DUONEB  Take 3 mLs by nebulization every 6 (six) hours as needed.     LORazepam 0.5 MG tablet  Commonly known as:  ATIVAN  Take 0.5 mg by mouth. Take 0.25 mg every 8 hours as needed     Melatonin 10 MG Caps  Take 1 capsule by mouth at bedtime.     memantine 10 MG tablet  Commonly known as:  NAMENDA  Take 10 mg by mouth. Take one tablet twice daily for memory     ondansetron 4 MG tablet  Commonly known as:  ZOFRAN  Take 4 mg by mouth 2 (two) times daily with a meal.     PRESERVISION AREDS 2 PO  Take 1 capsule by mouth 2 (two) times daily.     ROBITUSSIN DM PO  Take by mouth. Give 10 cc every 6 hours for 48 hours     sennosides-docusate sodium 8.6-50 MG tablet  Commonly known as:  SENOKOT-S  Take 1 tablet by mouth. Take one tablet at bedtime        Physical Exam: Filed Vitals:   07/26/15 1041  BP: 129/79  Pulse: 70  Temp: 98 F (36.7 C)  Resp: 20  Height: 5\' 6"  (1.676 m)  Weight: 144 lb (65.318 kg)  SpO2: 96%   Body mass index is 23.25 kg/(m^2). Physical Exam  Constitutional: No distress.  Cardiovascular: Normal rate, regular rhythm and normal heart sounds.   Pulmonary/Chest: Effort normal.  Some coarse rhonchi in upper lobes  Abdominal: Soft. Bowel sounds are normal.  Musculoskeletal:  Decreased ROM left arm at shoulder (prior humeral fx)  Neurological: She is alert.  Skin: Skin is warm and dry. There is pallor.    Labs reviewed: Basic Metabolic Panel:  Recent Labs  40/98/11 1916 04/05/15  NA 137 144  K 3.8 4.1  CL 101  --   CO2 25  --   GLUCOSE 130*  --   BUN 17 25*  CREATININE 0.90 1.0  CALCIUM 9.2  --    Liver Function Tests:  Recent Labs  04/05/15  AST 15  ALT 14  ALKPHOS 69   No results for input(s): LIPASE, AMYLASE in the last 8760 hours. No results for input(s): AMMONIA in the last 8760 hours. CBC:  Recent Labs  12/15/14 1916  12/17/14 0439 12/20/14 04/05/15  WBC 20.5* 19.2* 13.6 9.2  NEUTROABS 18.6*  --   --   --   HGB 13.6 13.4 13.5 12.8  HCT 41.2 41.1 41 40  MCV 91.4 92.6  --   --   PLT 226 223 324 253   Lipid Panel:  Recent Labs  04/05/15  CHOL 240*  HDL 50  LDLCALC 168  TRIG 147   No results found for: HGBA1C  Procedures since last visit: CXR reviewed  Assessment/Plan 1. Cough -suspect she had an aspiration episode that led to her coughing last night -seems much better this am -cont current conservative measures and monitor  2. Lung mass -persists on xray -continues to lose weight -cont comfort measures and avoid workup per family choice -pt unaware of this at family request and against the preferences of myself and staff  3. Alzheimer disease -cont 24x7 caregivers at AL -remains on namenda which may have helped some with her behavior  -she seems to do well with her primary caregiver who is used to her behaviors, but sometimes quite resistant and nasty to her other caregivers  4. Loss of weight -continues upon review of weights flowsheet -over the past year, she has lost 11 lbs  5. Active advance directive - DNR (Do Not Resuscitate) -has living will and hcpoa on file in media also  Labs/tests ordered:  No new Next appt: keep scheduled routine clinic appt  Delfina Schreurs L. Declyn Offield, D.O. Geriatrics [facility] [address], [place] [address] Cell Phone (Mon-Fri 8am-5pm):  [phone] On Call:  [phone] & follow prompts after 5pm & weekends Office Phone:  [phone] Office Fax:  [phone]

## 2015-08-17 ENCOUNTER — Encounter: Payer: Self-pay | Admitting: Internal Medicine

## 2015-08-17 ENCOUNTER — Non-Acute Institutional Stay: Payer: Medicare Other | Admitting: Internal Medicine

## 2015-08-17 VITALS — BP 100/60 | HR 70 | Temp 98.0°F | Ht 66.0 in | Wt 141.0 lb

## 2015-08-17 DIAGNOSIS — I482 Chronic atrial fibrillation, unspecified: Secondary | ICD-10-CM

## 2015-08-17 DIAGNOSIS — R918 Other nonspecific abnormal finding of lung field: Secondary | ICD-10-CM | POA: Diagnosis not present

## 2015-08-17 DIAGNOSIS — R634 Abnormal weight loss: Secondary | ICD-10-CM

## 2015-08-17 DIAGNOSIS — M6281 Muscle weakness (generalized): Secondary | ICD-10-CM

## 2015-08-17 DIAGNOSIS — I10 Essential (primary) hypertension: Secondary | ICD-10-CM

## 2015-08-17 DIAGNOSIS — F0280 Dementia in other diseases classified elsewhere without behavioral disturbance: Secondary | ICD-10-CM

## 2015-08-17 DIAGNOSIS — G309 Alzheimer's disease, unspecified: Secondary | ICD-10-CM | POA: Diagnosis not present

## 2015-08-17 NOTE — Progress Notes (Signed)
Patient ID: [name], female   DOB: 1921/07/16, 82 y.o.   MRN: [id number]   Location:  [facility] Room Number: 525 Place of Service:  Clinic (12)  Provider: Leelan Rajewski L. Renato Gails, D.O., C.M.D.  Code Status: DNR Goals of Care:  Advanced Directives 08/17/2015  Does patient have an advance directive? Yes  Type of Estate agent of [place];Out of facility DNR (pink MOST or yellow form);Living will  Copy of advanced directive(s) in chart? Yes  Pre-existing out of facility DNR order (yellow form or pink MOST form) -     Chief Complaint  Patient presents with  . Medical Management of Chronic Issues    4 mth follow-up, alzheimers, lung mass, BP, Afib, here with caregiver    HPI: Patient is a 82 y.o. female with known lung mass and recurrent aspiration pneumonias related to this and her dysphagia, afib, htn, and Alzheimer's disease seen today for medical management of chronic diseases.  She is weak in her legs.  This cramps her style.  She's had some therapy several times in the past.  Says she has good and bad days with her leg strength.  Doesn't necessarily stay the same.  No pain in them.  No other pains at present either.  No recent falls.  Does not want more therapy on her legs.  Does walk with her caregivers.    No coughing or sob.  Was having some of that when I last saw her.    Blood pressure is a little low.  Review of prior BPs shows that she's been below 130s consistently at appts.    Weight is down 3 lbs since I saw her on Valentine's day.  She's not as hungry as she used to be.  She also says she does not like the food here.  Upon review, her orders say she's on a heart healthy diet, but her caregiver doesn't think she really is a low sodium menu.    Past Medical History  Diagnosis Date  . Gastroesophageal reflux disease   . Atrial fibrillation (HCC) 2013    a. Dx 02/2012, had NSTEMI at that time. Rate control/anticoag.  .  Alzheimer's dementia   . Endometrial hyperplasia with atypia 1995  . Personal history of colonic polyps 2000  . Hyperlipidemia   . Hypertension   . Macular degeneration   . Osteoarthritis   . Osteopenia   . NSTEMI (non-ST elevated myocardial infarction) (HCC) 02/2012    a. Cath: large filling defect apical LAD suggestive of embolus - LM, RCA, LCx OK. Cath complicated by delirium  . Insomnia 11/16/2012  . Coronary embolism (HCC)     a. 02/2012 with NSTEMI (see above).  . Cardiomyopathy (HCC)     a. EF 45% by cath 02/2012, 30% by echocardiogram.    Past Surgical History  Procedure Laterality Date  . Appendectomy  1977  . Abdominal hysterectomy  1995    TAH/BSO  . Left heart catheterization with coronary angiogram N/A 02/19/2012    Procedure: LEFT HEART CATHETERIZATION WITH CORONARY ANGIOGRAM;  Surgeon: Tonny Bollman, MD;  Location: [facility] C[facility] LAB;  Service: Cardiovascular;  Laterality: N/A;    Allergies  Allergen Reactions  . Codeine Other (See Comments)    Unknown reaction Per [facility]       Medication List       This list is accurate as of: 08/17/15 11:29 AM.  Always use your most recent med list.  acetaminophen 325 MG tablet  Commonly known as:  TYLENOL  Take 650 mg by mouth daily.     aspirin 81 MG tablet  Take 81 mg by mouth daily.     atenolol 50 MG tablet  Commonly known as:  TENORMIN  Take 50 mg by mouth daily with breakfast.     buPROPion 75 MG tablet  Commonly known as:  WELLBUTRIN  Take 150 mg by mouth every morning.     diltiazem 240 MG 24 hr capsule  Commonly known as:  DILACOR XR  Take one tablet by mouth once daily     feeding supplement (ENSURE ENLIVE) Liqd  Take 237 mLs by mouth every morning.     ipratropium-albuterol 0.5-2.5 (3) MG/3ML Soln  Commonly known as:  DUONEB  Take 3 mLs by nebulization every 6 (six) hours as needed.     LORazepam 0.5 MG tablet  Commonly known as:  ATIVAN  Take 0.5 mg by mouth. Take 0.25 mg every 8 hours as  needed     Melatonin 10 MG Caps  Take 1 capsule by mouth at bedtime.     memantine 10 MG tablet  Commonly known as:  NAMENDA  Take 10 mg by mouth. Take one tablet twice daily for memory     ondansetron 4 MG tablet  Commonly known as:  ZOFRAN  Take 4 mg by mouth 2 (two) times daily with a meal.     PRESERVISION AREDS 2 PO  Take 1 capsule by mouth 2 (two) times daily.     ROBITUSSIN DM PO  Take by mouth. Give 10 cc every 6 hours for 48 hours     sennosides-docusate sodium 8.6-50 MG tablet  Commonly known as:  SENOKOT-S  Take 1 tablet by mouth. Take one tablet at bedtime        Review of Systems:  Review of Systems  Constitutional: Positive for fatigue and unexpected weight change. Negative for fever, chills, activity change and appetite change.  HENT: Positive for hearing loss.   Eyes:       Wears glasses  Respiratory: Negative for chest tightness and shortness of breath.   Cardiovascular: Negative for chest pain.  Gastrointestinal: Negative for vomiting, abdominal pain, diarrhea and constipation.  Genitourinary: Negative for dysuria and difficulty urinating.  Musculoskeletal: Positive for gait problem. Negative for myalgias and arthralgias.  Skin: Negative for color change.  Neurological: Negative for dizziness.  Psychiatric/Behavioral: Positive for confusion.       Alzheimer's    Health Maintenance  Topic Date Due  . DEXA SCAN  10/17/1988  . INFLUENZA VACCINE  01/10/2016  . TETANUS/TDAP  06/11/2020  . ZOSTAVAX  Completed  . PNA vac Low Risk Adult  Completed    Physical Exam: Filed Vitals:   08/17/15 1049  BP: 100/60  Pulse: 70  Temp: 98 F (36.7 C)  TempSrc: Oral  Height: 5\' 6"  (1.676 m)  Weight: 141 lb (63.957 kg)  SpO2: 93%   Body mass index is 22.77 kg/(m^2). Physical Exam  Constitutional: No distress.  Thin white female seated in wheelchair  Eyes:  glasses  Neck: Neck supple. No JVD present.  Cardiovascular:  irreg irreg  Pulmonary/Chest:  Effort normal.  Some coarse rhonchi in upper lung fields (chronic)  Abdominal: Soft. Bowel sounds are normal.  Musculoskeletal: Normal range of motion.  Lymphadenopathy:    She has no cervical adenopathy.  Neurological: She is alert.  Skin: Skin is warm and dry.  Psychiatric:  More pleasant today  Labs reviewed: Basic Metabolic Panel:  Recent Labs  40/98/11 1916 04/05/15  NA 137 144  K 3.8 4.1  CL 101  --   CO2 25  --   GLUCOSE 130*  --   BUN 17 25*  CREATININE 0.90 1.0  CALCIUM 9.2  --    Liver Function Tests:  Recent Labs  04/05/15  AST 15  ALT 14  ALKPHOS 69   No results for input(s): LIPASE, AMYLASE in the last 8760 hours. No results for input(s): AMMONIA in the last 8760 hours. CBC:  Recent Labs  12/15/14 1916 12/17/14 0439 12/20/14 04/05/15  WBC 20.5* 19.2* 13.6 9.2  NEUTROABS 18.6*  --   --   --   HGB 13.6 13.4 13.5 12.8  HCT 41.2 41.1 41 40  MCV 91.4 92.6  --   --   PLT 226 223 324 253   Lipid Panel:  Recent Labs  04/05/15  CHOL 240*  HDL 50  LDLCALC 168  TRIG 914   Assessment/Plan 1. Lung mass -still noted on latest chest xray done due to her cough about a month ago -still losing weight and appetite not great -family clear they do not want her to know about this and don't want a workup  2. Loss of weight -continues, now down to 141 lbs (lost three over 3 wks) -she knows she should eat more but does not have the appetite -changed diet today so order will not include low sodium hart healthy as blood pressures run on the lower end of normal anyway  3. Chronic atrial fibrillation (HCC) -continue diltiazem and baby asa -has h/o multiple falls and family has opted to avoid other blood thinners  4. Alzheimer disease -moderate to severe, sometimes has some behaviors with caregivers  -continue namenda and wellbutrin  5. Muscle weakness (generalized) -cont to walk with caregivers, refuses more PT and I'm not sure she'll have any  sustained benefit from it either  6. Essential hypertension -bp has been under excellent control with current meds and even runs low sometimes -liberalize diet in terms of sodium  Labs/tests ordered:  no new Next appt:  3 mos for med mgt  Maiko Salais L. Stormie Ventola, D.O. Geriatrics [facility] [address], [place] [address] Cell Phone (Mon-Fri 8am-5pm):  [phone] On Call:  [phone] & follow prompts after 5pm & weekends Office Phone:  [phone] Office Fax:  [phone]

## 2015-09-03 DIAGNOSIS — Z789 Other specified health status: Secondary | ICD-10-CM | POA: Insufficient documentation

## 2015-11-16 ENCOUNTER — Encounter: Payer: Medicare Other | Admitting: Internal Medicine

## 2015-12-02 ENCOUNTER — Telehealth: Payer: Self-pay | Admitting: *Deleted

## 2015-12-02 NOTE — Telephone Encounter (Signed)
Pt refused to come to appt.  She was very agitated that day.  It should be rescheduled if it was not.

## 2015-12-02 NOTE — Telephone Encounter (Signed)
Appointment rescheduled to 12/07/15 and [facility] notified.

## 2015-12-02 NOTE — Telephone Encounter (Signed)
Nurse from [place] called and stated that the daughter is questioning follow up appointment from 11/16/15 appointment. When I look at the schedule for that day patient showed up and the status is complete but there is no OV note for that day in patient's chart. Daughter stated that patient was told she was to follow up but not given an appointment and there is no note in the system to check it. Please Advise.

## 2015-12-07 ENCOUNTER — Non-Acute Institutional Stay: Payer: Medicare Other | Admitting: Internal Medicine

## 2015-12-07 ENCOUNTER — Encounter: Payer: Self-pay | Admitting: Internal Medicine

## 2015-12-07 VITALS — BP 128/60 | HR 73 | Temp 98.9°F | Ht 66.0 in | Wt 137.0 lb

## 2015-12-07 DIAGNOSIS — R918 Other nonspecific abnormal finding of lung field: Secondary | ICD-10-CM | POA: Diagnosis not present

## 2015-12-07 DIAGNOSIS — I10 Essential (primary) hypertension: Secondary | ICD-10-CM

## 2015-12-07 DIAGNOSIS — G309 Alzheimer's disease, unspecified: Secondary | ICD-10-CM

## 2015-12-07 DIAGNOSIS — M6281 Muscle weakness (generalized): Secondary | ICD-10-CM | POA: Diagnosis not present

## 2015-12-07 DIAGNOSIS — F329 Major depressive disorder, single episode, unspecified: Secondary | ICD-10-CM | POA: Diagnosis not present

## 2015-12-07 DIAGNOSIS — R634 Abnormal weight loss: Secondary | ICD-10-CM | POA: Diagnosis not present

## 2015-12-07 DIAGNOSIS — G4700 Insomnia, unspecified: Secondary | ICD-10-CM | POA: Diagnosis not present

## 2015-12-07 DIAGNOSIS — F0280 Dementia in other diseases classified elsewhere without behavioral disturbance: Secondary | ICD-10-CM

## 2015-12-07 DIAGNOSIS — M810 Age-related osteoporosis without current pathological fracture: Secondary | ICD-10-CM

## 2015-12-07 DIAGNOSIS — F32A Depression, unspecified: Secondary | ICD-10-CM

## 2015-12-07 NOTE — Progress Notes (Signed)
Location:   [facility]   Place of Service:  Clinic (12)  Provider: Zoe Nordin L. Renato Gails, D.O., C.M.D.  Code Status: DNR Goals of Care:  Advanced Directives 12/07/2015  Does patient have an advance directive? Yes  Type of Estate agent of [place];Living will;Out of facility DNR (pink MOST or yellow form)  Copy of advanced directive(s) in chart? Yes  Pre-existing out of facility DNR order (yellow form or pink MOST form) Yellow form placed in chart (order not valid for inpatient use)     Chief Complaint  Patient presents with  . Medical Management of Chronic Issues    follow-up    HPI: Patient is a 82 y.o. female seen today for medical management of chronic diseases.    She went out with her daughter today some.  Says she wants to do something to make her legs stronger.  Discussed deconditioning from being in bed.  Does walk in the halls with help.    Is eating, but not hungry.  Does try to eat three meals per day.  Still does not rest well at night--says that has always been a problem.  Thinks about her day.  Does rest in the daytime--has a nap.  Does take the melatonin to help her sleep.  Weight down to 137 from 141 in March.     For her dementia, continues on namenda.    Depression:  Her daughter says she is chipper most of the time when she talks with her.  Is on wellbutrin with ativan as needed.  Feels overrun sometimes with all of her caregivers.  Likes some time to herself, but likes the help they offer.    BP is well controlled.    Bowels no complaints related to this.    Does not hurt.  Used to complain of her left shoulder after breaking her humerus.    Past Medical History  Diagnosis Date  . Gastroesophageal reflux disease   . Atrial fibrillation (HCC) 2013    a. Dx 02/2012, had NSTEMI at that time. Rate control/anticoag.  . Alzheimer's dementia   . Endometrial hyperplasia with atypia 1995  . Personal history of colonic polyps 2000  .  Hyperlipidemia   . Hypertension   . Macular degeneration   . Osteoarthritis   . Osteopenia   . NSTEMI (non-ST elevated myocardial infarction) (HCC) 02/2012    a. Cath: large filling defect apical LAD suggestive of embolus - LM, RCA, LCx OK. Cath complicated by delirium  . Insomnia 11/16/2012  . Coronary embolism (HCC)     a. 02/2012 with NSTEMI (see above).  . Cardiomyopathy (HCC)     a. EF 45% by cath 02/2012, 30% by echocardiogram.    Past Surgical History  Procedure Laterality Date  . Appendectomy  1977  . Abdominal hysterectomy  1995    TAH/BSO  . Left heart catheterization with coronary angiogram N/A 02/19/2012    Procedure: LEFT HEART CATHETERIZATION WITH CORONARY ANGIOGRAM;  Surgeon: Tonny Bollman, MD;  Location: [facility] CATH LAB;  Service: Cardiovascular;  Laterality: N/A;    Allergies  Allergen Reactions  . Codeine Other (See Comments)    Unknown reaction Per [facility]       Medication List       This list is accurate as of: 12/07/15  2:19 PM.  Always use your most recent med list.               acetaminophen 325 MG tablet  Commonly known as:  TYLENOL  Take 650 mg by mouth daily.     aspirin 81 MG tablet  Take 81 mg by mouth daily.     atenolol 50 MG tablet  Commonly known as:  TENORMIN  Take 50 mg by mouth daily with breakfast.     buPROPion 75 MG tablet  Commonly known as:  WELLBUTRIN  Take 150 mg by mouth every morning.     diltiazem 240 MG 24 hr capsule  Commonly known as:  DILACOR XR  Take one tablet by mouth once daily     feeding supplement (ENSURE ENLIVE) Liqd  Take 237 mLs by mouth every morning.     ipratropium-albuterol 0.5-2.5 (3) MG/3ML Soln  Commonly known as:  DUONEB  Take 3 mLs by nebulization every 6 (six) hours as needed.     LORazepam 0.5 MG tablet  Commonly known as:  ATIVAN  Take 0.5 mg by mouth. Take 0.25 mg every 8 hours as needed     Melatonin 10 MG Caps  Take 1 capsule by mouth at bedtime.     memantine 10 MG tablet  Commonly  known as:  NAMENDA  Take 10 mg by mouth. Take one tablet twice daily for memory     ondansetron 4 MG tablet  Commonly known as:  ZOFRAN  Take 4 mg by mouth 2 (two) times daily with a meal.     PRESERVISION AREDS 2 PO  Take 1 capsule by mouth 2 (two) times daily.     sennosides-docusate sodium 8.6-50 MG tablet  Commonly known as:  SENOKOT-S  Take 1 tablet by mouth. Take one tablet at bedtime        Review of Systems:  Review of Systems  Constitutional: Negative for fever and chills.  HENT: Positive for hearing loss. Negative for congestion.   Eyes: Negative for blurred vision.       Macular  Respiratory: Negative for shortness of breath.   Cardiovascular: Negative for chest pain, palpitations and leg swelling.  Gastrointestinal: Negative for abdominal pain and constipation.  Genitourinary: Negative for dysuria.  Musculoskeletal: Negative for joint pain and falls.  Skin: Negative for rash.  Neurological: Negative for dizziness and weakness.  Psychiatric/Behavioral: Positive for memory loss. Negative for depression.    Health Maintenance  Topic Date Due  . DEXA SCAN  10/17/1988  . INFLUENZA VACCINE  01/10/2016  . TETANUS/TDAP  06/11/2020  . ZOSTAVAX  Completed  . PNA vac Low Risk Adult  Completed    Physical Exam: Filed Vitals:   12/07/15 1407  BP: 128/60  Pulse: 73  Temp: 98.9 F (37.2 C)  TempSrc: Oral  Height: 5\' 6"  (1.676 m)  Weight: 137 lb (62.143 kg)  SpO2: 93%   Body mass index is 22.12 kg/(m^2). Physical Exam  Constitutional: She appears well-developed. No distress.  Cardiovascular: Intact distal pulses.   Murmur heard. irreg irreg  Pulmonary/Chest: Effort normal.  Coarse rhonchi right upper lung  Abdominal: Soft. Bowel sounds are normal.  Musculoskeletal: Normal range of motion.  Neurological: She is alert.  Skin: Skin is warm and dry.  Psychiatric: She has a normal mood and affect.  Very pleasant and sociable today    Labs reviewed: Basic  Metabolic Panel:  Recent Labs  05/69/79 1916 04/05/15  NA 137 144  K 3.8 4.1  CL 101  --   CO2 25  --   GLUCOSE 130*  --   BUN 17 25*  CREATININE 0.90 1.0  CALCIUM 9.2  --  Liver Function Tests:  Recent Labs  04/05/15  AST 15  ALT 14  ALKPHOS 69   No results for input(s): LIPASE, AMYLASE in the last 8760 hours. No results for input(s): AMMONIA in the last 8760 hours. CBC:  Recent Labs  12/15/14 1916 12/17/14 0439 12/20/14 04/05/15  WBC 20.5* 19.2* 13.6 9.2  NEUTROABS 18.6*  --   --   --   HGB 13.6 13.4 13.5 12.8  HCT 41.2 41.1 41 40  MCV 91.4 92.6  --   --   PLT 226 223 324 253   Lipid Panel:  Recent Labs  04/05/15  CHOL 240*  HDL 50  LDLCALC 168  TRIG 161  Procedures since last visit: CXR 07/26/15:  Persistent RUL process.   Assessment/Plan 1. Essential hypertension -bp well controlled on current regimen w/o dizziness so continue same, maintain hydration  2. Alzheimer disease -continues on namenda -has been stable per her daughter so will not "rock the boat"  -very poor short term memory and not able to perform adls on her own due to memory loss and weakness  3. Senile osteoporosis -had prior humerus fx with fall, but meds were reduced due to dysphagia and sometimes refusal so off ca and D and minimally ambulatory at this time only with caregivers so risk of fall with fx reduced  4. Insomnia -cont melatonin at hs and ativan q8 prn anxiety -has periods of agitation and sometimes is not pleasant to caregivers and regular staff even--in fact, she missed her last appt due to agitation -today she is very pleasant and calm  5. Loss of weight -continues to lose weight gradually (down 3 lbs in 3 mos.) -intake is small amts but is eating at least her 3 meals per day plus snacks and supplements  6. Lung mass -was still present on feb imaging -it's remained unclear if this is a malignancy or due to recurrent aspiration, but, either way, she has had  failure to thrive as a result  7. Depression -her daughter says her spirits are good -based on my interactions, it's different day to day, but she is continuing on wellbuitrin with ativan as needed for agitation/anxiety  8. Muscle weakness (generalized) -her biggest complaint is her leg weakness which came from deconditioning after her fall with humeral fx and when her lung mass was noted -cont walks with caregivers  Labs/tests ordered:  Cbc, cmp Next appt:  F/u in October labs before  Autum Benfer L. Jarelis Ehlert, D.O. Geriatrics [facility] [address], [place] [address] Cell Phone (Mon-Fri 8am-5pm):  [phone] On Call:  [phone] & follow prompts after 5pm & weekends Office Phone:  [phone] Office Fax:  [phone]

## 2016-02-01 ENCOUNTER — Encounter: Payer: Self-pay | Admitting: Internal Medicine

## 2016-02-01 ENCOUNTER — Non-Acute Institutional Stay: Payer: Medicare Other | Admitting: Internal Medicine

## 2016-02-01 VITALS — BP 130/78 | HR 70 | Temp 98.4°F | Wt 135.0 lb

## 2016-02-01 DIAGNOSIS — G309 Alzheimer's disease, unspecified: Secondary | ICD-10-CM

## 2016-02-01 DIAGNOSIS — R531 Weakness: Secondary | ICD-10-CM | POA: Diagnosis not present

## 2016-02-01 DIAGNOSIS — F0280 Dementia in other diseases classified elsewhere without behavioral disturbance: Secondary | ICD-10-CM

## 2016-02-01 NOTE — Progress Notes (Signed)
Location:  [facility]   Place of Service:  Clinic (12)  Provider: Jordi Lacko L. Renato Gailseed, D.O., C.M.D.  Code Status: DNR Goals of Care:  Advanced Directives 02/01/2016  Does patient have an advance directive? -  Type of Advance Directive Out of facility DNR (pink MOST or yellow form);Living will;Healthcare Power of Attorney  Does patient want to make changes to advanced directive? -  Copy of advanced directive(s) in chart? Yes  Would patient like information on creating an advanced directive? -  Pre-existing out of facility DNR order (yellow form or pink MOST form) Yellow form placed in chart (order not valid for inpatient use)     Chief Complaint  Patient presents with  . Acute Visit    discuss getting order for physical therapy for weakness in legs    HPI: Patient is a 82 y.o. female seen today for an acute visit for weakness in her right greater than left leg.  She reports that it gives out when she is walking.  She is unable to say if it's her knee or hip, but the right leg feels weaker and it frightens her that she may fall.  She would like some more PT, but has noted already that they worked her hard in the past.  She does walk with her caregivers with a current walking program, but sometimes refuses to participate.  She denies any pain in her legs to limit her walking.  She does not do much of her seated exercises per caregivers but says she does them sometimes.  Past Medical History:  Diagnosis Date  . Alzheimer's dementia   . Atrial fibrillation (HCC) 2013   a. Dx 02/2012, had NSTEMI at that time. Rate control/anticoag.  . Cardiomyopathy (HCC)    a. EF 45% by cath 02/2012, 30% by echocardiogram.  . Coronary embolism (HCC)    a. 02/2012 with NSTEMI (see above).  . Endometrial hyperplasia with atypia 1995  . Gastroesophageal reflux disease   . Hyperlipidemia   . Hypertension   . Insomnia 11/16/2012  . Macular degeneration   . NSTEMI (non-ST elevated myocardial infarction)  (HCC) 02/2012   a. Cath: large filling defect apical LAD suggestive of embolus - LM, RCA, LCx OK. Cath complicated by delirium  . Osteoarthritis   . Osteopenia   . Personal history of colonic polyps 2000    Past Surgical History:  Procedure Laterality Date  . ABDOMINAL HYSTERECTOMY  1995   TAH/BSO  . APPENDECTOMY  1977  . LEFT HEART CATHETERIZATION WITH CORONARY ANGIOGRAM N/A 02/19/2012   Procedure: LEFT HEART CATHETERIZATION WITH CORONARY ANGIOGRAM;  Surgeon: Tonny BollmanMichael Cooper, MD;  [facility];  Service: Cardiovascular;  Laterality: N/A;    Allergies  Allergen Reactions  . Codeine Other (See Comments)    [facility]       Medication List       Accurate as of 02/01/16  3:40 PM. Always use your most recent med list.          acetaminophen 325 MG tablet Commonly known as:  TYLENOL Take 650 mg by mouth daily.   aspirin 81 MG tablet Take 81 mg by mouth daily.   atenolol 50 MG tablet Commonly known as:  TENORMIN Take 50 mg by mouth daily with breakfast.   buPROPion 75 MG tablet Commonly known as:  WELLBUTRIN Take 150 mg by mouth every morning.   diltiazem 240 MG 24 hr capsule Commonly known as:  DILACOR XR Take one tablet by  mouth once daily   feeding supplement (ENSURE ENLIVE) Liqd Take 237 mLs by mouth every morning.   ipratropium-albuterol 0.5-2.5 (3) MG/3ML Soln Commonly known as:  DUONEB Take 3 mLs by nebulization every 6 (six) hours as needed.   LORazepam 0.5 MG tablet Commonly known as:  ATIVAN Take 0.5 mg by mouth. Take 0.25 mg every 8 hours as needed   Melatonin 10 MG Caps Take 1 capsule by mouth at bedtime.   memantine 10 MG tablet Commonly known as:  NAMENDA Take 10 mg by mouth. Take one tablet twice daily for memory   ondansetron 4 MG tablet Commonly known as:  ZOFRAN Take 4 mg by mouth 2 (two) times daily with a meal.   PRESERVISION AREDS 2 PO Take 1 capsule by mouth 2 (two) times daily.   sennosides-docusate sodium  8.6-50 MG tablet Commonly known as:  SENOKOT-S Take 1 tablet by mouth. Take one tablet at bedtime       Review of Systems:  Review of Systems  Constitutional: Positive for malaise/fatigue. Negative for chills and fever.  Musculoskeletal: Negative for back pain, falls, joint pain and myalgias.       Right leg gives out  Skin: Negative for rash.  Neurological: Positive for weakness. Negative for dizziness and loss of consciousness.    Health Maintenance  Topic Date Due  . DEXA SCAN  10/17/1988  . INFLUENZA VACCINE  01/10/2016  . TETANUS/TDAP  06/11/2020  . ZOSTAVAX  Completed  . PNA vac Low Risk Adult  Completed    Physical Exam: Vitals:   02/01/16 1535  BP: 130/78  Pulse: 70  Temp: 98.4 F (36.9 C)  TempSrc: Oral  SpO2: 94%  Weight: 135 lb (61.2 kg)   Body mass index is 21.79 kg/m. Physical Exam  Constitutional: She appears well-developed and well-nourished. No distress.  Thin white female seated in wheelchair  Musculoskeletal: Normal range of motion.  Quad strength seems equal at bilateral hips on flexion  Neurological: She is alert.    Labs reviewed: Basic Metabolic Panel:  Recent Labs  40/98/11  NA 144  K 4.1  BUN 25*  CREATININE 1.0   Liver Function Tests:  Recent Labs  04/05/15  AST 15  ALT 14  ALKPHOS 69   No results for input(s): LIPASE, AMYLASE in the last 8760 hours. No results for input(s): AMMONIA in the last 8760 hours. CBC:  Recent Labs  04/05/15  WBC 9.2  HGB 12.8  HCT 40  PLT 253   Lipid Panel:  Recent Labs  04/05/15  CHOL 240*  HDL 50  LDLCALC 168  TRIG 914   No results found for: HGBA1C    Assessment/Plan 1. Generalized weakness -is progressing over time and she's walking less -she now requests some PT for strengthening to walk more -has been sometimes participating in walking program and exercises with CNAs -she has historically lacked motivation to participate and tired very easily, but we will try to see  how she does this time  2.  Alzheimer's disease -is progressing with more and more short term memory loss -has periods of agitation with different caregivers and nursing staff are to be checking with the aides to see if they need for her to receive some ativan to prevent combativeness -this cannot be discussed in her presence due to severe agitation that ensues -this is likely a big part of her decline in ambulation also along with her chronic lung mass so unclear how much improvement we can get  Labs/tests ordered:  PT referral Next appt:  04/04/2016  Damontay Alred L. Clifford Benninger, D.O. Geriatrics [facility] [address], [place] [address] Cell Phone (Mon-Fri 8am-5pm):  [phone] On Call:  [phone] & follow prompts after 5pm & weekends Office Phone:  [phone] Office Fax:  [phone]

## 2016-02-16 ENCOUNTER — Non-Acute Institutional Stay: Payer: Medicare Other | Admitting: Adult Health

## 2016-02-16 DIAGNOSIS — J189 Pneumonia, unspecified organism: Secondary | ICD-10-CM

## 2016-02-16 DIAGNOSIS — R918 Other nonspecific abnormal finding of lung field: Secondary | ICD-10-CM

## 2016-02-16 DIAGNOSIS — R531 Weakness: Secondary | ICD-10-CM

## 2016-02-16 DIAGNOSIS — Z7189 Other specified counseling: Secondary | ICD-10-CM | POA: Diagnosis not present

## 2016-02-16 DIAGNOSIS — B370 Candidal stomatitis: Secondary | ICD-10-CM | POA: Diagnosis not present

## 2016-02-20 ENCOUNTER — Non-Acute Institutional Stay: Payer: Medicare Other | Admitting: Adult Health

## 2016-02-20 ENCOUNTER — Encounter: Payer: Self-pay | Admitting: Adult Health

## 2016-02-20 DIAGNOSIS — Z7189 Other specified counseling: Secondary | ICD-10-CM | POA: Diagnosis not present

## 2016-02-20 DIAGNOSIS — R918 Other nonspecific abnormal finding of lung field: Secondary | ICD-10-CM

## 2016-02-20 DIAGNOSIS — R451 Restlessness and agitation: Secondary | ICD-10-CM | POA: Diagnosis not present

## 2016-02-20 DIAGNOSIS — J189 Pneumonia, unspecified organism: Secondary | ICD-10-CM | POA: Diagnosis not present

## 2016-02-20 DIAGNOSIS — R627 Adult failure to thrive: Secondary | ICD-10-CM | POA: Diagnosis not present

## 2016-02-20 NOTE — Progress Notes (Signed)
Patient ID: [name], female   DOB: 11-30-1921, 82 y.o.   MRN: [id number]  Location:    [facility]   Place of Service:  ALF (13) Provider:   Peggye Ley, ANP [facility] [phone]   [name], Elmarie Shiley, DO  Patient Care Team: Kermit Balo, DO as PCP - General (Geriatric Medicine) Lewayne Bunting, MD as Consulting Physician (Cardiology) [facility]  Extended Emergency Contact Information Primary Emergency Contact: [facility] Address: [address]          [place], [place] [address] [place] Home Phone: [phone] Mobile Phone: [phone] Relation: Daughter Secondary Emergency Contact: [name] Address: [address]          [place], [place] [address] D[place] of [place] Home Phone: [phone] Mobile Phone: [phone] Relation: Daughter  Code Status:  DNR Goals of care: Advanced Directive information Advanced Directives 02/01/2016  Does patient have an advance directive? -  Type of Advance Directive Out of facility DNR (pink MOST or yellow form);Living will;Healthcare Power of Attorney  Does patient want to make changes to advanced directive? -  Copy of advanced directive(s) in chart? Yes  Would patient like information on creating an advanced directive? -  Pre-existing out of facility DNR order (yellow form or pink MOST form) Yellow form placed in chart (order not valid for inpatient use)     Chief Complaint  Patient presents with  . Acute Visit    pna, hypoxia    HPI:  Pt is a 82 y.o. female seen today for an acute visit for RUL pna after CXR was done on 9/7.  Oncall provider placed her on Avelox.  She has a hx of RUL mass first noted in Feb of 2016. She is a DNR and her family did not want to pursue further work up due to her age and dementia.  She has not had a fever but did experience some wheezing and hypoxia which is why the film was ordered. She has overall become weaker and been losing more weight.     Past Medical History:  Diagnosis Date  . Alzheimer's dementia   . Atrial fibrillation (HCC) 2013   a. Dx 02/2012, had NSTEMI at that time. Rate control/anticoag.  . Cardiomyopathy (HCC)    a. EF 45% by cath 02/2012, 30% by echocardiogram.  . Coronary embolism (HCC)    a. 02/2012 with NSTEMI (see above).  . Endometrial hyperplasia with atypia 1995  . Gastroesophageal reflux disease   . Hyperlipidemia   . Hypertension   . Insomnia 11/16/2012  . Macular degeneration   . NSTEMI (non-ST elevated myocardial infarction) (HCC) 02/2012   a. Cath: large filling defect apical LAD suggestive of embolus - LM, RCA, LCx OK. Cath complicated by delirium  . Osteoarthritis   . Osteopenia   . Personal history of colonic polyps 2000   Past Surgical History:  Procedure Laterality Date  . ABDOMINAL HYSTERECTOMY  1995   TAH/BSO  . APPENDECTOMY  1977  . LEFT HEART CATHETERIZATION WITH CORONARY ANGIOGRAM N/A 02/19/2012   Procedure: LEFT HEART CATHETERIZATION WITH CORONARY ANGIOGRAM;  Surgeon: Tonny Bollman, MD;  Location[facility];  Service: Cardiovascular;  Laterality: N/A;    Allergies  Allergen Reactions  . Codeine Other (See Comments)    Unknown reaction Per [facility]       Medication List       Accurate as of 02/16/16 11:59 PM. Always use your most recent med list.  acetaminophen 325 MG tablet Commonly known as:  TYLENOL Take 650 mg by mouth daily.   aspirin 81 MG tablet Take 81 mg by mouth daily.   atenolol 50 MG tablet Commonly known as:  TENORMIN Take 50 mg by mouth daily with breakfast.   buPROPion 75 MG tablet Commonly known as:  WELLBUTRIN Take 150 mg by mouth every morning.   diltiazem 240 MG 24 hr capsule Commonly known as:  DILACOR XR Take one tablet by mouth once daily   feeding supplement (ENSURE ENLIVE) Liqd Take 237 mLs by mouth every morning.   ipratropium-albuterol 0.5-2.5 (3) MG/3ML Soln Commonly known as:  DUONEB Take 3 mLs by nebulization every 6  (six) hours as needed.   LORazepam 0.5 MG tablet Commonly known as:  ATIVAN Take 0.5 mg by mouth. Take 0.25 mg every 8 hours as needed   Melatonin 10 MG Caps Take 1 capsule by mouth at bedtime.   memantine 10 MG tablet Commonly known as:  NAMENDA Take 10 mg by mouth. Take one tablet twice daily for memory   ondansetron 4 MG tablet Commonly known as:  ZOFRAN Take 4 mg by mouth 2 (two) times daily with a meal.   PRESERVISION AREDS 2 PO Take 1 capsule by mouth 2 (two) times daily.   sennosides-docusate sodium 8.6-50 MG tablet Commonly known as:  SENOKOT-S Take 1 tablet by mouth. Take one tablet at bedtime       Review of Systems  Constitutional: Positive for activity change, appetite change and fatigue. Negative for chills, diaphoresis, fever and unexpected weight change.  HENT: Negative for congestion.   Respiratory: Positive for wheezing. Negative for cough and shortness of breath.   Cardiovascular: Negative for chest pain, palpitations and leg swelling.  Gastrointestinal: Negative for abdominal distention, abdominal pain, constipation and diarrhea.  Genitourinary: Negative for difficulty urinating and dysuria.  Musculoskeletal: Negative for arthralgias, back pain, gait problem, joint swelling and myalgias.  Neurological: Negative for dizziness, tremors, seizures, syncope, facial asymmetry, speech difficulty, weakness, light-headedness, numbness and headaches.  Psychiatric/Behavioral: Positive for confusion. Negative for agitation and behavioral problems.    Immunization History  Administered Date(s) Administered  . Influenza Whole 03/11/2012, 04/10/2013  . Influenza-Unspecified 03/16/2014, 03/24/2015  . Pneumococcal Conjugate-13 01/12/2015  . Pneumococcal Polysaccharide-23 06/11/2006  . Td 06/11/2010  . Zoster 06/11/2004   Pertinent  Health Maintenance Due  Topic Date Due  . DEXA SCAN  10/17/1988  . INFLUENZA VACCINE  01/10/2016  . PNA vac Low Risk Adult  Completed    Fall Risk  08/17/2015 01/12/2015 09/02/2013 12/01/2012  Falls in the past year? No Yes No No  Number falls in past yr: - 2 or more - -  Injury with Fall? - Yes - -  Risk Factor Category  - High Fall Risk - -  Risk for fall due to : - History of fall(s);Impaired balance/gait;Impaired mobility;Mental status change;Impaired vision - -  Follow up - Education provided - -   Functional Status Survey:    There were no vitals filed for this visit. There is no height or weight on file to calculate BMI. Physical Exam  Constitutional: No distress.  HENT:  Head: Normocephalic and atraumatic.  Right Ear: External ear normal.  Left Ear: External ear normal.  Nose: Nose normal.  Mouth/Throat: No oropharyngeal exudate.  Thrush noted to tongue  Eyes: Conjunctivae are normal. Pupils are equal, round, and reactive to light. Right eye exhibits no discharge. Left eye exhibits no discharge.  Neck: No JVD  present.  Cardiovascular: Normal rate and regular rhythm.   No murmur heard. Pulmonary/Chest: Effort normal. No respiratory distress. She has wheezes.  Abdominal: Soft. Bowel sounds are normal. She exhibits no distension.  Lymphadenopathy:    She has no cervical adenopathy.  Neurological: She is alert.  Oriented to self and placed, not time or situation  Skin: Skin is warm and dry. She is not diaphoretic.  Psychiatric: She has a normal mood and affect.    Labs reviewed:  Recent Labs  04/05/15  NA 144  K 4.1  BUN 25*  CREATININE 1.0    Recent Labs  04/05/15  AST 15  ALT 14  ALKPHOS 69    Recent Labs  04/05/15  WBC 9.2  HGB 12.8  HCT 40  PLT 253   Lab Results  Component Value Date   TSH 1.723 07/09/2014   No results found for: HGBA1C Lab Results  Component Value Date   CHOL 240 (A) 04/05/2015   HDL 50 04/05/2015   LDLCALC 168 04/05/2015   TRIG 112 04/05/2015   CHOLHDL 3.6 02/19/2012    Significant Diagnostic Results in last 30 days:  No results  found.  Assessment/Plan  1. HCAP (healthcare-associated pneumonia) ?? Lung mass vs post obstructive pna? Continue Avelox to complete 7 days of therapy May use Duoneb q 6 hrs prn cough/sob Oxygen for sat >/=90%  2. Lung mass No further work up per family due to age and debility Unclear if this area is increasing in size or if there is underlying pna  3. Oral thrush Nystatin 5 ml QID for 7 days  4. Weakness -due to #1 and 2 (general) -check BMP to rule out dehydration  5. Advanced care planning -I had a long discussion with her daughter and let her know that Ms. Schimming is declining and that our goals of care should be non aggressive and based on comfort. We agreed to no hospitalizations and no IVF but later she called back to say that her sister would like her to receive IVF if she became dehydrated and so we will check her BMP and treat accordingly.    Family/ staff Communication: discussed with her daughter Delsa BernLynn  Labs/tests ordered:  BMP

## 2016-02-20 NOTE — Progress Notes (Signed)
Patient ID: [name], female   DOB: 1922/05/10, 82 y.o.   MRN: [id number]  Location:    [facility]   Place of Service:  SNF (31) Provider:   Peggye Ley, ANP [facility] [phone]   [name], Elmarie Shiley, DO  Patient Care Team: Kermit Balo, DO as PCP - General (Geriatric Medicine) Lewayne Bunting, MD as Consulting Physician (Cardiology) [facility]  Extended Emergency Contact Information Primary Emergency Contact: [facility] Address: [address]          [place], [place] [address] Darden Amber of [place] Home Phone: [phone] Mobile Phone: [phone] Relation: Daughter Secondary Emergency Contact: [name] Address: [address]          [place], [place] [address] Darden Amber of [place] Home Phone: [phone] Mobile Phone: [phone] Relation: Daughter  Code Status:  DNR Goals of care: Advanced Directive information Advanced Directives 02/01/2016  Does patient have an advance directive? -  Type of Advance Directive Out of facility DNR (pink MOST or yellow form);Living will;Healthcare Power of Attorney  Does patient want to make changes to advanced directive? -  Copy of advanced directive(s) in chart? Yes  Would patient like information on creating an advanced directive? -  Pre-existing out of facility DNR order (yellow form or pink MOST form) Yellow form placed in chart (order not valid for inpatient use)     Chief Complaint  Patient presents with  . Acute Visit    low grade temp, dyspnea, not swallowing antibiotic    HPI:  Pt is a 82 y.o. female seen today for an acute visit for RUL pna after CXR was done on 9/7.  Oncall provider placed her on Avelox.  She has a hx of RUL mass first noted in Feb of 2016. She is a DNR and her family did not want to pursue further work up due to her age and dementia.  She has not had a fever but did experience some wheezing and hypoxia which is why the film was ordered. She has overall become  weaker and been losing more weight.   Update 02/20/16: Resident is having difficulty swallowing pills, has some mild resp distress, sat 90% on 2.5L, agitation with personal care, and family is requesting a meeting because the "antibiotic is not working" She has some congestion and sputum production, low grade temp of 99.9, and finds some relief with duonebs  Past Medical History:  Diagnosis Date  . Alzheimer's dementia   . Atrial fibrillation (HCC) 2013   a. Dx 02/2012, had NSTEMI at that time. Rate control/anticoag.  . Cardiomyopathy (HCC)    a. EF 45% by cath 02/2012, 30% by echocardiogram.  . Coronary embolism (HCC)    a. 02/2012 with NSTEMI (see above).  . Endometrial hyperplasia with atypia 1995  . Gastroesophageal reflux disease   . Hyperlipidemia   . Hypertension   . Insomnia 11/16/2012  . Macular degeneration   . NSTEMI (non-ST elevated myocardial infarction) (HCC) 02/2012   a. Cath: large filling defect apical LAD suggestive of embolus - LM, RCA, LCx OK. Cath complicated by delirium  . Osteoarthritis   . Osteopenia   . Personal history of colonic polyps 2000   Past Surgical History:  Procedure Laterality Date  . ABDOMINAL HYSTERECTOMY  1995   TAH/BSO  . APPENDECTOMY  1977  . LEFT HEART CATHETERIZATION WITH CORONARY ANGIOGRAM N/A 02/19/2012   Procedure: LEFT HEART CATHETERIZATION WITH CORONARY ANGIOGRAM;  Surgeon: Tonny Bollman, MD;  Location: [facility] CATH LAB;  Service: Cardiovascular;  Laterality: N/A;    Allergies  Allergen Reactions  . Codeine Other (See Comments)    Unknown reaction Per [facility]       Medication List       Accurate as of 02/20/16  2:59 PM. Always use your most recent med list.          acetaminophen 325 MG tablet Commonly known as:  TYLENOL Take 650 mg by mouth daily.   aspirin 81 MG tablet Take 81 mg by mouth daily.   atenolol 50 MG tablet Commonly known as:  TENORMIN Take 50 mg by mouth daily with breakfast.   buPROPion 75 MG tablet Commonly  known as:  WELLBUTRIN Take 150 mg by mouth every morning.   diltiazem 240 MG 24 hr capsule Commonly known as:  DILACOR XR Take one tablet by mouth once daily   feeding supplement (ENSURE ENLIVE) Liqd Take 237 mLs by mouth every morning.   ipratropium-albuterol 0.5-2.5 (3) MG/3ML Soln Commonly known as:  DUONEB Take 3 mLs by nebulization every 6 (six) hours as needed.   LORazepam 0.5 MG tablet Commonly known as:  ATIVAN Take 0.5 mg by mouth. Take 0.25 mg every 8 hours as needed   Melatonin 10 MG Caps Take 1 capsule by mouth at bedtime.   memantine 10 MG tablet Commonly known as:  NAMENDA Take 10 mg by mouth. Take one tablet twice daily for memory   ondansetron 4 MG tablet Commonly known as:  ZOFRAN Take 4 mg by mouth 2 (two) times daily with a meal.   PRESERVISION AREDS 2 PO Take 1 capsule by mouth 2 (two) times daily.   sennosides-docusate sodium 8.6-50 MG tablet Commonly known as:  SENOKOT-S Take 1 tablet by mouth. Take one tablet at bedtime       Review of Systems  Constitutional: Positive for activity change, appetite change and fatigue. Negative for chills, diaphoresis, fever and unexpected weight change.  HENT: Negative for congestion.   Respiratory: Positive for wheezing. Negative for cough and shortness of breath.   Cardiovascular: Negative for chest pain, palpitations and leg swelling.  Gastrointestinal: Negative for abdominal distention, abdominal pain, constipation and diarrhea.  Genitourinary: Negative for difficulty urinating and dysuria.  Musculoskeletal: Negative for arthralgias, back pain, gait problem, joint swelling and myalgias.  Neurological: Negative for dizziness, tremors, seizures, syncope, facial asymmetry, speech difficulty, weakness, light-headedness, numbness and headaches.  Psychiatric/Behavioral: Positive for confusion. Negative for agitation and behavioral problems.    Immunization History  Administered Date(s) Administered  .  Influenza Whole 03/11/2012, 04/10/2013  . Influenza-Unspecified 03/16/2014, 03/24/2015  . Pneumococcal Conjugate-13 01/12/2015  . Pneumococcal Polysaccharide-23 06/11/2006  . Td 06/11/2010  . Zoster 06/11/2004   Pertinent  Health Maintenance Due  Topic Date Due  . DEXA SCAN  10/17/1988  . INFLUENZA VACCINE  01/10/2016  . PNA vac Low Risk Adult  Completed   Fall Risk  08/17/2015 01/12/2015 09/02/2013 12/01/2012  Falls in the past year? No Yes No No  Number falls in past yr: - 2 or more - -  Injury with Fall? - Yes - -  Risk Factor Category  - High Fall Risk - -  Risk for fall due to : - History of fall(s);Impaired balance/gait;Impaired mobility;Mental status change;Impaired vision - -  Follow up - Education provided - -   Functional Status Survey:    Vitals:   02/20/16 1444  BP: 114/71  Pulse: 100  Resp: (!) 24  Temp: 98.1 F (36.7 C)  SpO2:  95%   There is no height or weight on file to calculate BMI. Physical Exam  Constitutional: No distress.  HENT:  Head: Normocephalic and atraumatic.  Right Ear: External ear normal.  Left Ear: External ear normal.  Nose: Nose normal.  Mouth/Throat: No oropharyngeal exudate.  Eyes: Conjunctivae are normal. Pupils are equal, round, and reactive to light. Right eye exhibits no discharge. Left eye exhibits no discharge.  Neck: No JVD present.  Cardiovascular: Normal rate and regular rhythm.   No murmur heard. Pulmonary/Chest: Effort normal. No respiratory distress.  Bilateral rhonchi  Abdominal: Soft. Bowel sounds are normal. She exhibits no distension.  Lymphadenopathy:    She has no cervical adenopathy.  Neurological: She is alert.  Oriented to self and placed, not time or situation  Skin: Skin is warm and dry. She is not diaphoretic.  Psychiatric:  Mild agitation  Nursing note and vitals reviewed.   Labs reviewed:  Recent Labs  04/05/15  NA 144  K 4.1  BUN 25*  CREATININE 1.0    Recent Labs  04/05/15  AST 15  ALT 14   ALKPHOS 69    Recent Labs  04/05/15  WBC 9.2  HGB 12.8  HCT 40  PLT 253   Lab Results  Component Value Date   TSH 1.723 07/09/2014   No results found for: HGBA1C Lab Results  Component Value Date   CHOL 240 (A) 04/05/2015   HDL 50 04/05/2015   LDLCALC 168 04/05/2015   TRIG 112 04/05/2015   CHOLHDL 3.6 02/19/2012    Significant Diagnostic Results in last 30 days:  No results found.  Assessment/Plan  1. HCAP (healthcare-associated pneumonia) We were not able to obtain an IV so will continue with Avelox to complete 7 day course.  She did not swallow it last night but they will try it in applesauce today.   Duonebs q 6 prn No aggressive measures or hospitalizations Family will review most form and return  2. Lung mass Noted on xray with progressive weight loss No further work up.  She is showing signs of increased wob breathing which could be due to treatment failure with avelox or worsening resp failure from the mass. Either way her goals of care are comfort based and treatment will not be escalated as we were not able to obtain an IV.  3. FTT (failure to thrive) in adult Due to dementia and lung mass with poor p.o. Intake Continue nutritional supplements and encourage p.o. Fluid with caregivers at bedsdie  4. Agitation Increase ativan to 0.5 mg q 4 hrs prn  5. Advanced care planning/counseling discussion After a 1 hr discussion with both her daughter we agreed to try an IV for antibiotics but due to her poor venous access and agitation we were not able to do so. Her overall quality of life is poor and it would be futile to proceed with more interventions. They daughters are in agreement with this. I have offered them hospice care and they are considering this option.   See above. Spent 60 min with family/resident, >50% in counseling and coordination of care   Family/ staff Communication: discussed with her daughters  Labs/tests ordered:  BMP

## 2016-02-24 ENCOUNTER — Non-Acute Institutional Stay: Payer: Medicare Other | Admitting: Adult Health

## 2016-02-24 ENCOUNTER — Encounter: Payer: Self-pay | Admitting: Adult Health

## 2016-02-24 DIAGNOSIS — R918 Other nonspecific abnormal finding of lung field: Secondary | ICD-10-CM | POA: Diagnosis not present

## 2016-02-24 DIAGNOSIS — J9601 Acute respiratory failure with hypoxia: Secondary | ICD-10-CM | POA: Diagnosis not present

## 2016-02-24 DIAGNOSIS — J189 Pneumonia, unspecified organism: Secondary | ICD-10-CM

## 2016-02-24 NOTE — Progress Notes (Signed)
Patient ID: [name], female   DOB: 12/20/1921, 82 y.o.   MRN: [id number]  Location:    [facility]   Place of Service:  ALF (13) Provider:   Peggye Ley, ANP [facility] [phone]   [name], [name]  Patient Care Team: Kermit Balo, [name] as PCP - General (Geriatric Medicine) Lewayne Bunting, MD as Consulting Physician (Cardiology) [facility]  Extended Emergency Contact Information Primary Emergency Contact: [facility] Address: [address]          [place], [place] [address] Darden Amber of [place] Home Phone: [phone] Mobile Phone: [phone] Relation: Daughter Secondary Emergency Contact: [name] Address: [address]          [place], [place] [address] [place] of [place] Home Phone: [phone] Mobile Phone: [phone] Relation: Daughter  Code Status:  DNR Goals of care: Advanced Directive information Advanced Directives 02/01/2016  Does patient have an advance directive? -  Type of Advance Directive Out of facility DNR (pink MOST or yellow form);Living will;Healthcare Power of Attorney  Does patient want to make changes to advanced directive? -  Copy of advanced directive(s) in chart? Yes  Would patient like information on creating an advanced directive? -  Pre-existing out of facility DNR order (yellow form or pink MOST form) Yellow form placed in chart (order not valid for inpatient use)     Chief Complaint  Patient presents with  . Acute Visit    resp distress    HPI:  02/16/16: Pt is a 82 y.o. female seen today for an acute visit for RUL pna after CXR was done on 9/7.  Oncall provider placed her on Avelox.  She has a hx of RUL mass first noted in Feb of 2016. She is a DNR and her family did not want to pursue further work up due to her age and dementia.  She has not had a fever but did experience some wheezing and hypoxia which is why the film was ordered. She has overall become weaker and been losing more  weight.   Update 02/20/16: Resident is having difficulty swallowing pills, has some mild resp distress, sat 90% on 2.5L, agitation with personal care, and family is requesting a meeting because the "antibiotic is not working" She has some congestion and sputum production, low grade temp of 99.9, and finds some relief with duonebs  Update 02/24/16 Started on IVF due to poor intake and NA of 151 noted on 9/14. Received 1L of D5W.  Started on Zosyn on 9/14 due to worsening pna vs lung mass to the RUL noted on cxr when compare to previous xrays.  Appears more sob and restless, now requiring ativan q 2 hrs for agitation. No longer eating or drinking. Requires oxygen at 2L, sats 95%.    Past Medical History:  Diagnosis Date  . Alzheimer's dementia   . Atrial fibrillation (HCC) 2013   a. Dx 02/2012, had NSTEMI at that time. Rate control/anticoag.  . Cardiomyopathy (HCC)    a. EF 45% by cath 02/2012, 30% by echocardiogram.  . Coronary embolism (HCC)    a. 02/2012 with NSTEMI (see above).  . Endometrial hyperplasia with atypia 1995  . Gastroesophageal reflux disease   . Hyperlipidemia   . Hypertension   . Insomnia 11/16/2012  . Macular degeneration   . NSTEMI (non-ST elevated myocardial infarction) (HCC) 02/2012   a. Cath: large filling defect apical LAD suggestive of embolus - LM, RCA, LCx OK. Cath complicated by delirium  .  Osteoarthritis   . Osteopenia   . Personal history of colonic polyps 2000   Past Surgical History:  Procedure Laterality Date  . ABDOMINAL HYSTERECTOMY  1995   TAH/BSO  . APPENDECTOMY  1977  . LEFT HEART CATHETERIZATION WITH CORONARY ANGIOGRAM N/A 02/19/2012   Procedure: LEFT HEART CATHETERIZATION WITH CORONARY ANGIOGRAM;  Surgeon: Tonny BollmanMichael Cooper, MD;  [facility];  Service: Cardiovascular;  Laterality: N/A;    Allergies  Allergen Reactions  . Codeine Other (See Comments)    [facility]       Medication List       Accurate as of 02/24/16 12:15  PM. Always use your most recent med list.          acetaminophen 325 MG tablet Commonly known as:  TYLENOL Take 650 mg by mouth daily.   aspirin 81 MG tablet Take 81 mg by mouth daily.   atenolol 50 MG tablet Commonly known as:  TENORMIN Take 50 mg by mouth daily with breakfast.   diltiazem 240 MG 24 hr capsule Commonly known as:  DILACOR XR Take one tablet by mouth once daily   feeding supplement (ENSURE ENLIVE) Liqd Take 237 mLs by mouth every morning.   ipratropium-albuterol 0.5-2.5 (3) MG/3ML Soln Commonly known as:  DUONEB Take 3 mLs by nebulization every 6 (six) hours as needed.   LORazepam 0.5 MG tablet Commonly known as:  ATIVAN Take 0.5 mg by mouth. Take 0.25 mg every 8 hours as needed   Melatonin 10 MG Caps Take 1 capsule by mouth at bedtime.   ondansetron 4 MG tablet Commonly known as:  ZOFRAN Take 4 mg by mouth 2 (two) times daily with a meal.   PRESERVISION AREDS 2 PO Take 1 capsule by mouth 2 (two) times daily.   sennosides-docusate sodium 8.6-50 MG tablet Commonly known as:  SENOKOT-S Take 1 tablet by mouth. Take one tablet at bedtime       Review of Systems  Constitutional: Positive for activity change, appetite change and fatigue. Negative for chills, diaphoresis, fever and unexpected weight change.  HENT: Negative for congestion.   Respiratory: Positive for wheezing. Negative for cough and shortness of breath.   Cardiovascular: Negative for chest pain, palpitations and leg swelling.  Gastrointestinal: Negative for abdominal distention, abdominal pain, constipation and diarrhea.  Genitourinary: Negative for difficulty urinating and dysuria.  Musculoskeletal: Negative for arthralgias, back pain, gait problem, joint swelling and myalgias.  Neurological: Negative for dizziness, tremors, seizures, syncope, facial asymmetry, speech difficulty, weakness, light-headedness, numbness and headaches.  Psychiatric/Behavioral: Positive for confusion.  Negative for agitation and behavioral problems.    Immunization History  Administered Date(s) Administered  . Influenza Whole 03/11/2012, 04/10/2013  . Influenza-Unspecified 03/16/2014, 03/24/2015  . Pneumococcal Conjugate-13 01/12/2015  . Pneumococcal Polysaccharide-23 06/11/2006  . Td 06/11/2010  . Zoster 06/11/2004   Pertinent  Health Maintenance Due  Topic Date Due  . DEXA SCAN  10/17/1988  . INFLUENZA VACCINE  01/10/2016  . PNA vac Low Risk Adult  Completed   Fall Risk  08/17/2015 01/12/2015 09/02/2013 12/01/2012  Falls in the past year? No Yes No No  Number falls in past yr: - 2 or more - -  Injury with Fall? - Yes - -  Risk Factor Category  - High Fall Risk - -  Risk for fall due to : - History of fall(s);Impaired balance/gait;Impaired mobility;Mental status change;Impaired vision - -  Follow up - Education provided - -   Functional Status Survey:  Vitals:   02/24/16 1209  BP: (!) 141/78  Pulse: 78  Resp: (!) 24  Temp: 97.3 F (36.3 C)  SpO2: 95%   There is no height or weight on file to calculate BMI. Physical Exam  Constitutional: She appears distressed (mild).  HENT:  Head: Normocephalic and atraumatic.  Eyes: Conjunctivae are normal. Pupils are equal, round, and reactive to light.  Neck: No JVD present.  Cardiovascular: Normal rate and regular rhythm.   No murmur heard. Pulmonary/Chest: Effort normal. No respiratory distress. She has wheezes.  Bilateral rhonchi  Abdominal: Soft. Bowel sounds are normal. She exhibits no distension.  Lymphadenopathy:    She has no cervical adenopathy.  Neurological:  Lethargic, arouses to physical stimuli  Skin: Skin is warm and dry. She is not diaphoretic.  Nursing note and vitals reviewed.   Labs reviewed:  Recent Labs  04/05/15  NA 144  K 4.1  BUN 25*  CREATININE 1.0    Recent Labs  04/05/15  AST 15  ALT 14  ALKPHOS 69    Recent Labs  04/05/15  WBC 9.2  HGB 12.8  HCT 40  PLT 253   Lab Results    Component Value Date   TSH 1.723 07/09/2014   No results found for: HGBA1C Lab Results  Component Value Date   CHOL 240 (A) 04/05/2015   HDL 50 04/05/2015   LDLCALC 168 04/05/2015   TRIG 112 04/05/2015   CHOLHDL 3.6 02/19/2012    Significant Diagnostic Results in last 30 days:  No results found.  Assessment/Plan  1. Acute respiratory failure with hypoxia (HCC) Increased work of breathing relieved with ativan and oxygen Appears to be nearing the end of life. Add roxanol 5 mg q 2 hrs prn sob or pain  2. HCAP (healthcare-associated pneumonia) D/C zosyn due to change in condition  3. Lung mass Worsening resp failure, deteriorating condition, weight loss Comfort care in place. I have discussed her situation with her daughters at length, as well as their representative and caretaker.  4. Hypernatremia No further labs, did not respond well to IVF and is now in mild resp distres No further IVs

## 2016-03-11 DEATH — deceased

## 2016-04-04 ENCOUNTER — Encounter: Payer: Self-pay | Admitting: Internal Medicine

## 2016-06-09 IMAGING — CR DG ELBOW COMPLETE 3+V*L*
4 series · 4 of 4 positions shown · non-contrast
Comparison: Left forearm July 06, 2014

CLINICAL DATA: Status post fall yesterday now left arm and left
shoulder pain

EXAM:
LEFT ELBOW - COMPLETE 3+ VIEW

[x elbow ap left]
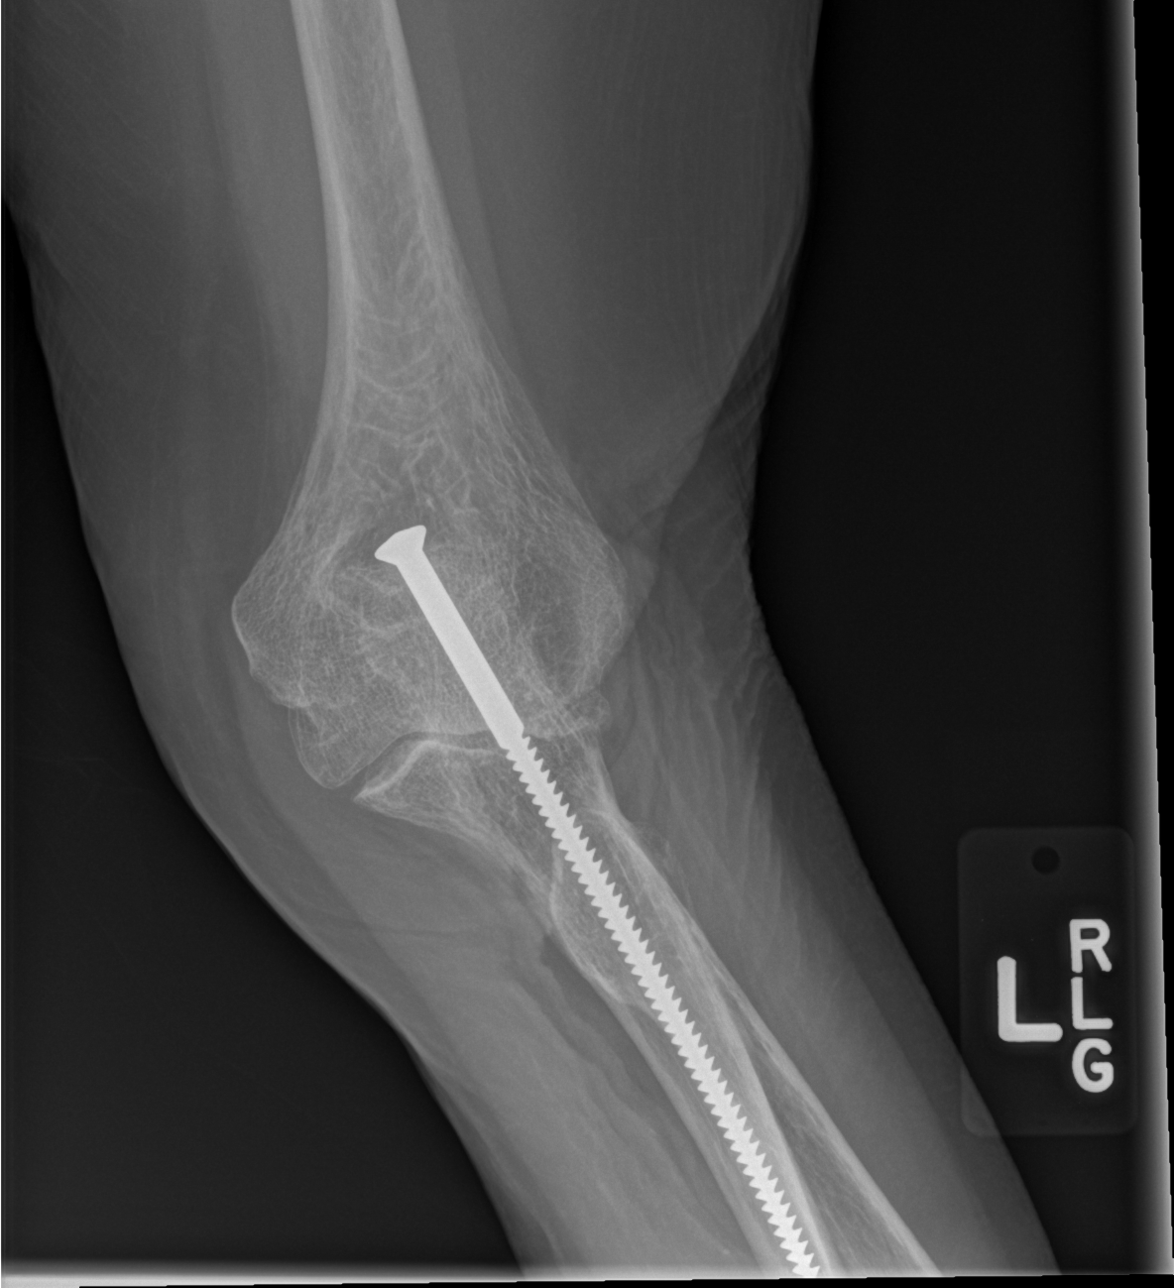

[x elbow obl left (1 of 2)]
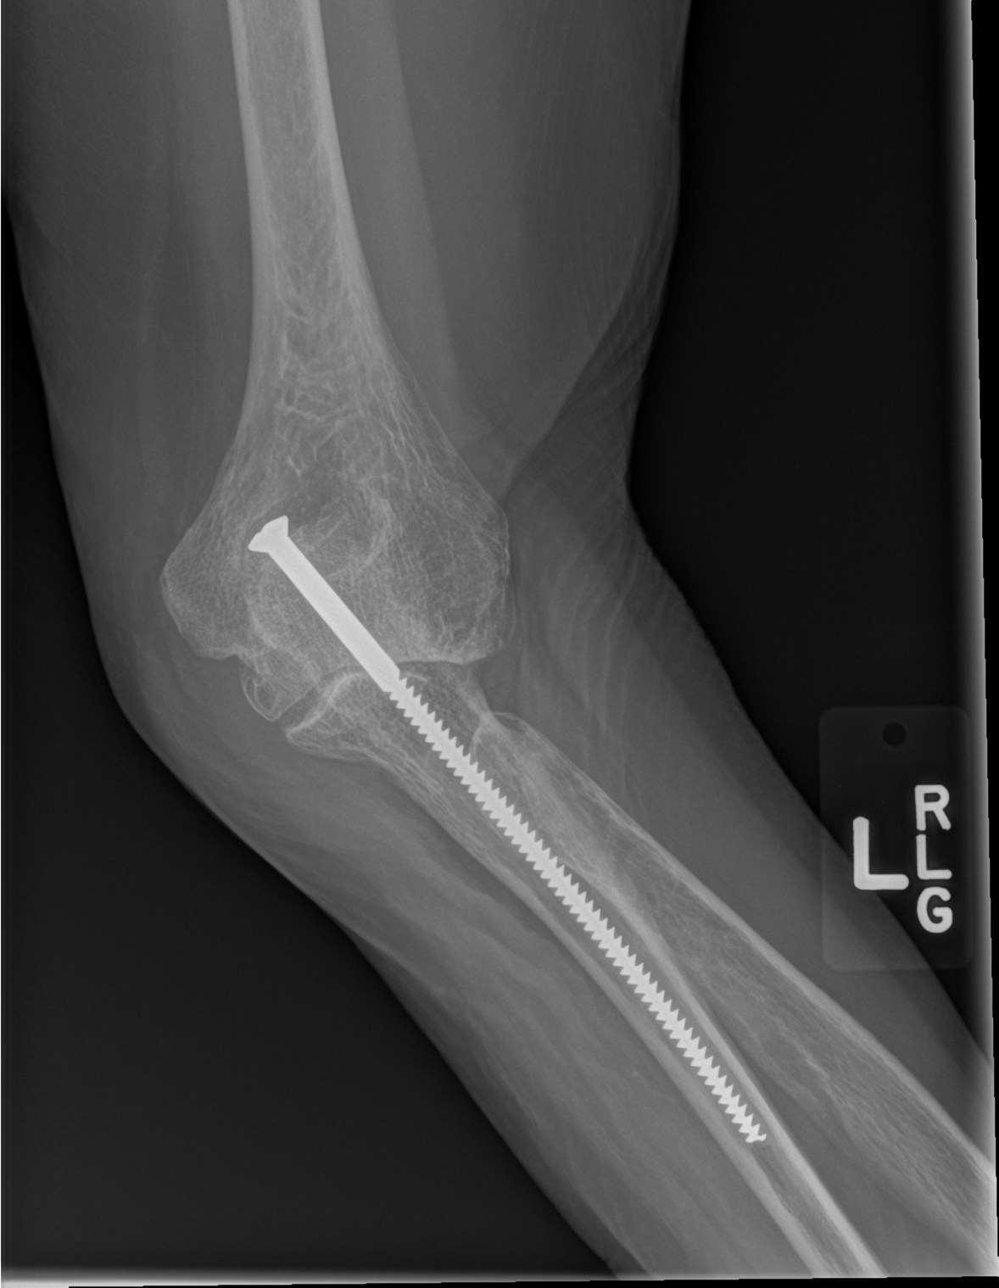

[x elbow obl left (2 of 2)]
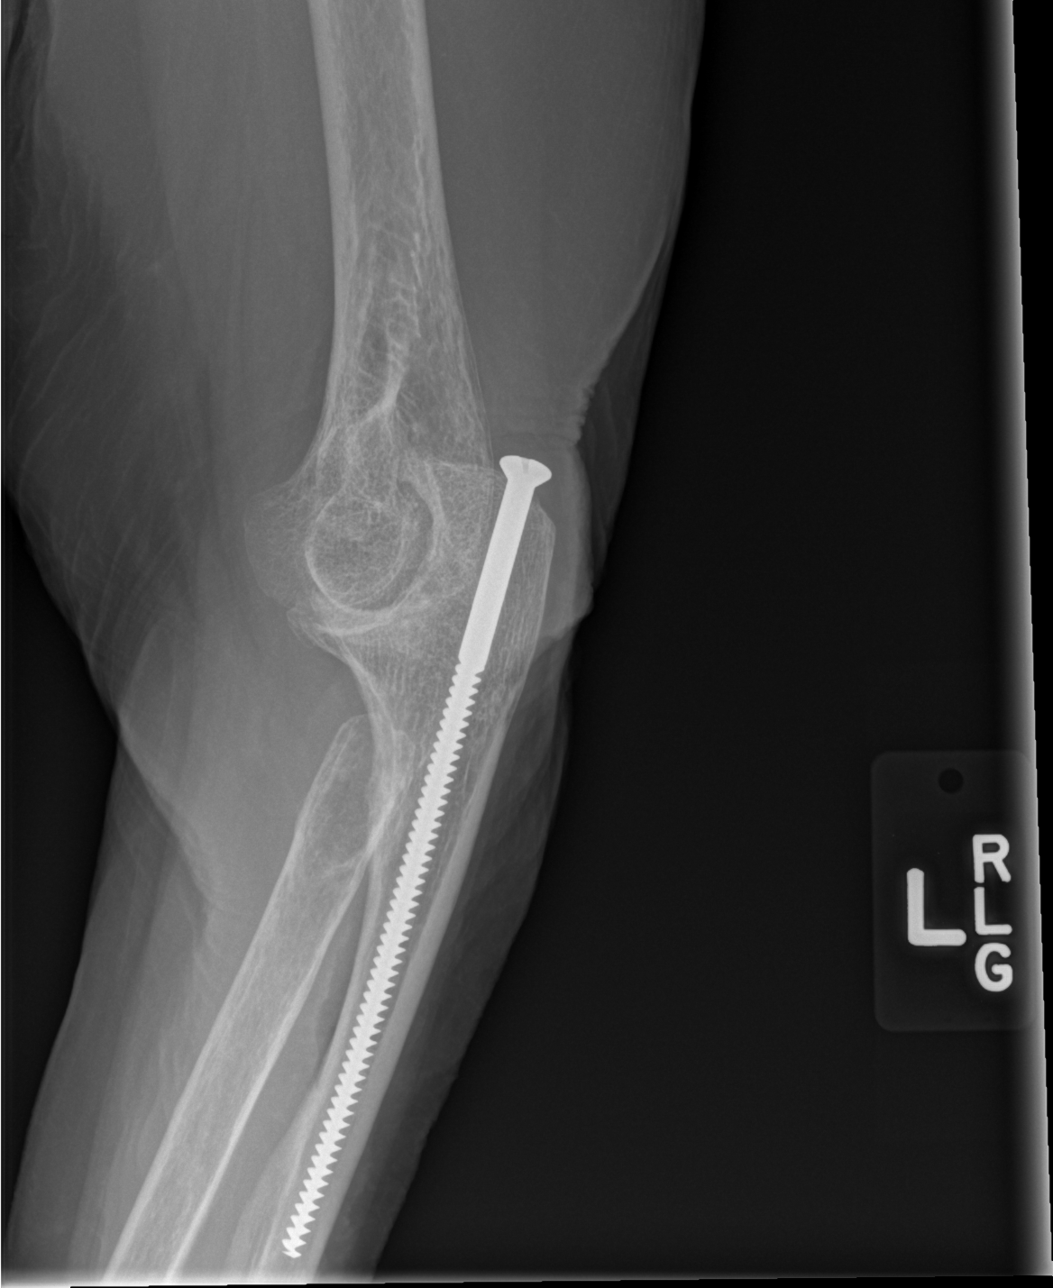

[x elbow lat left]
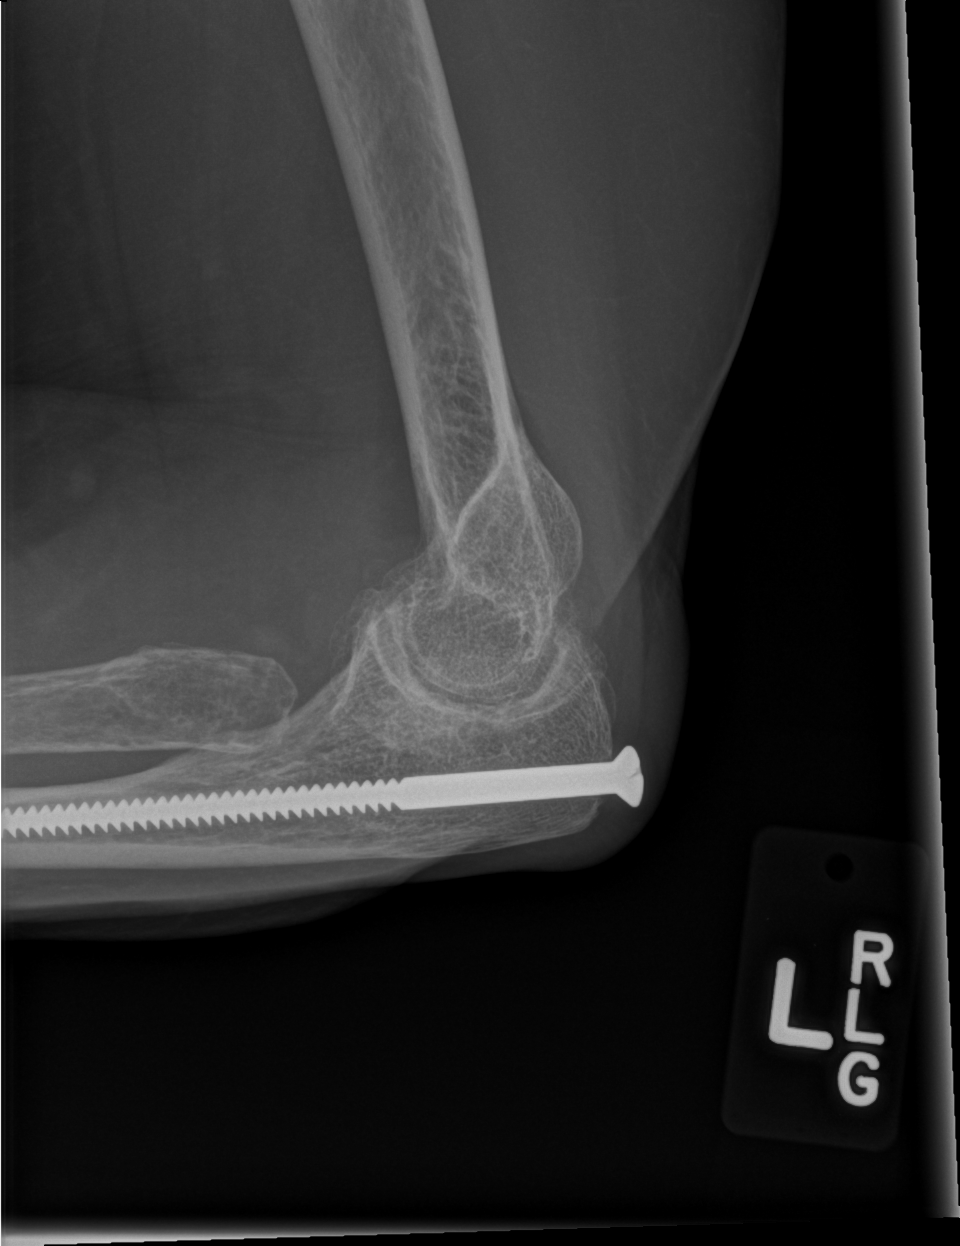

[4 of 4 positions shown; findings below may reference images not displayed]

FINDINGS: There is a cortical screw placed through the proximal and shaft of
the ulna which is intact. The radial head is absent. The distal
humerus is intact. No acute fracture of the elbow is demonstrated.
There is diffuse osteopenia. There is no definite joint effusion.
IMPRESSION: There are chronic postsurgical changes but there is no acute bony
abnormality of the left elbow.

## 2016-06-11 IMAGING — CR DG CHEST 2V
2 series · 2 of 2 positions shown · non-contrast
Comparison: 02/18/2012

CLINICAL DATA: Confusion.  Atrial fibrillation.  Hypertension.

EXAM:
CHEST  2 VIEW

[w chest lat]
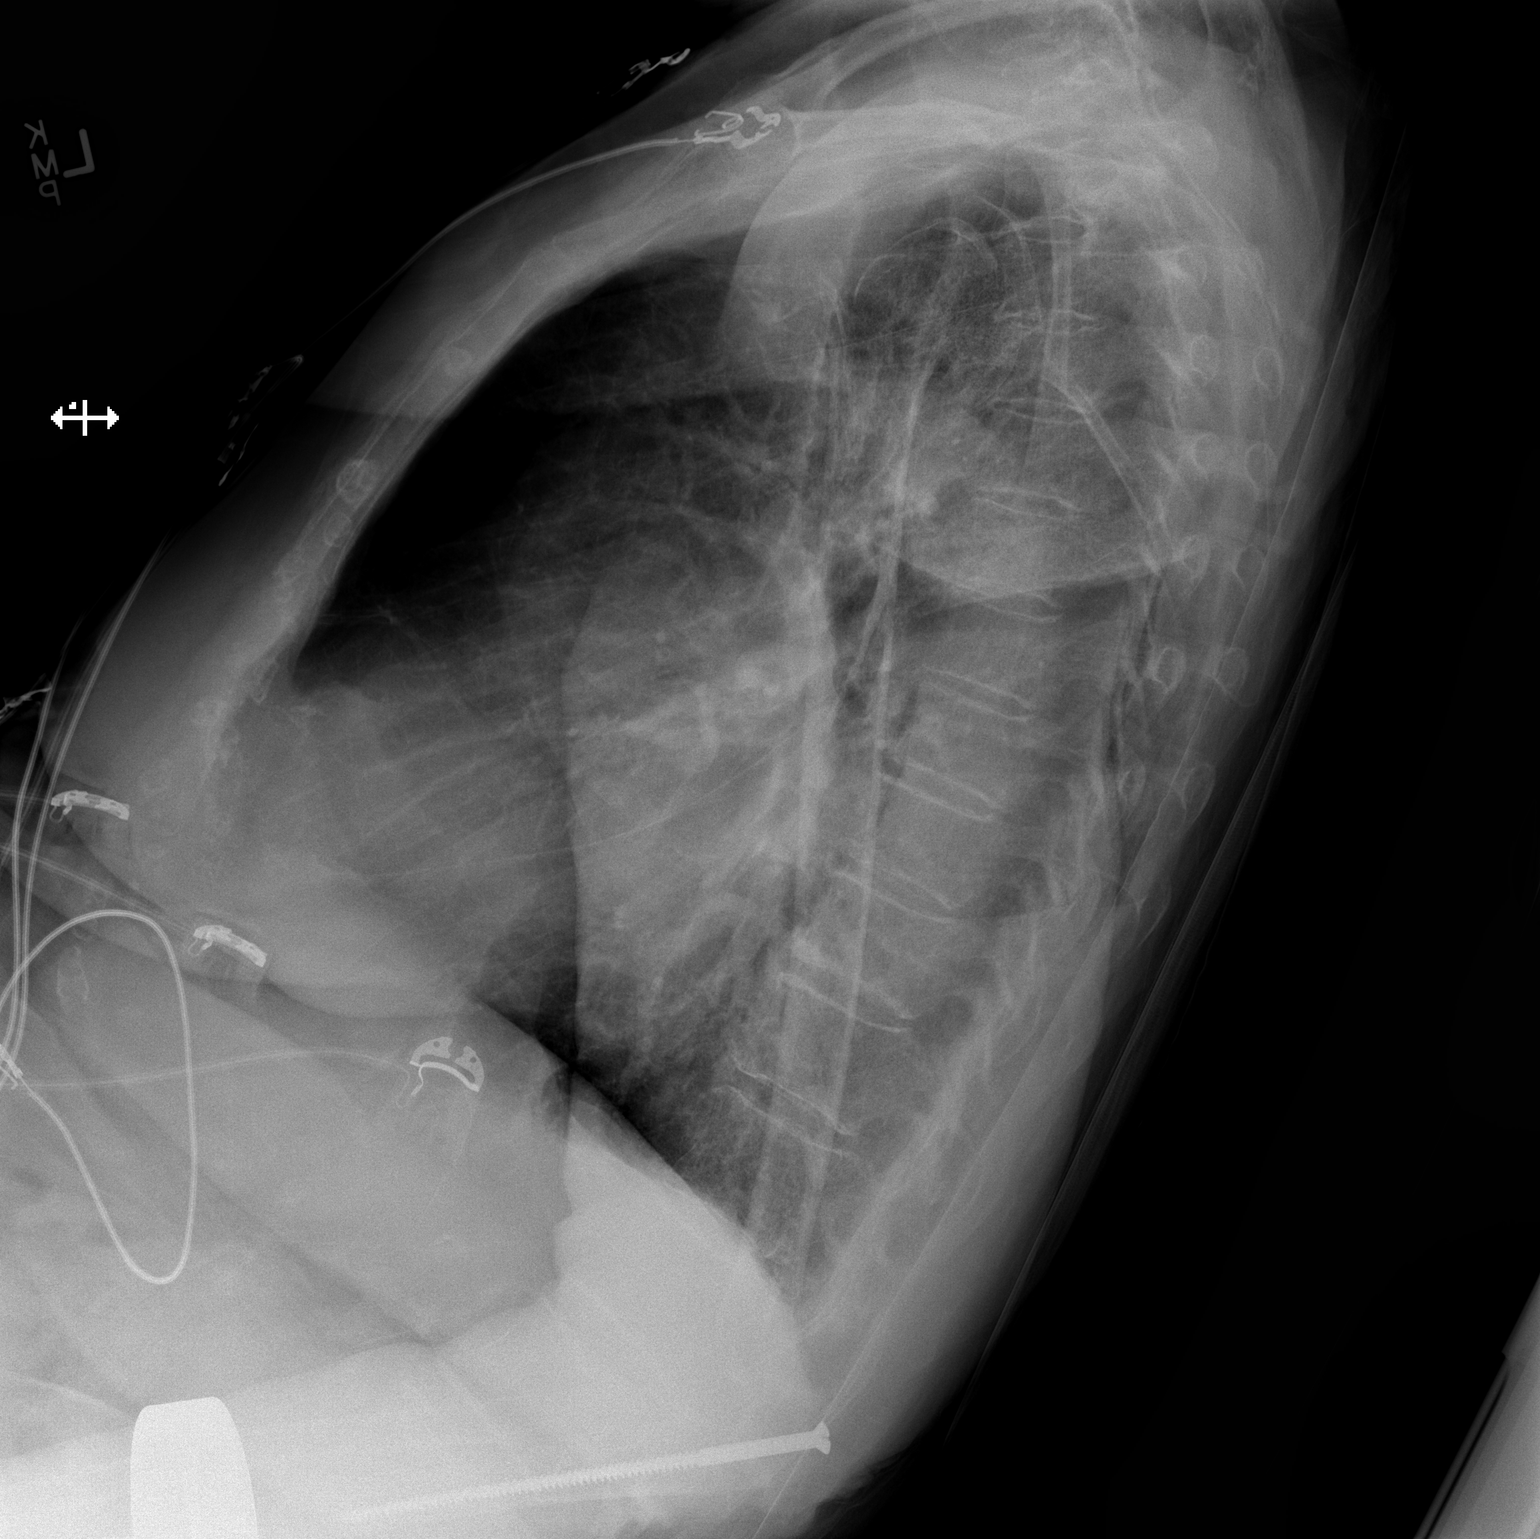

[x chest ap]
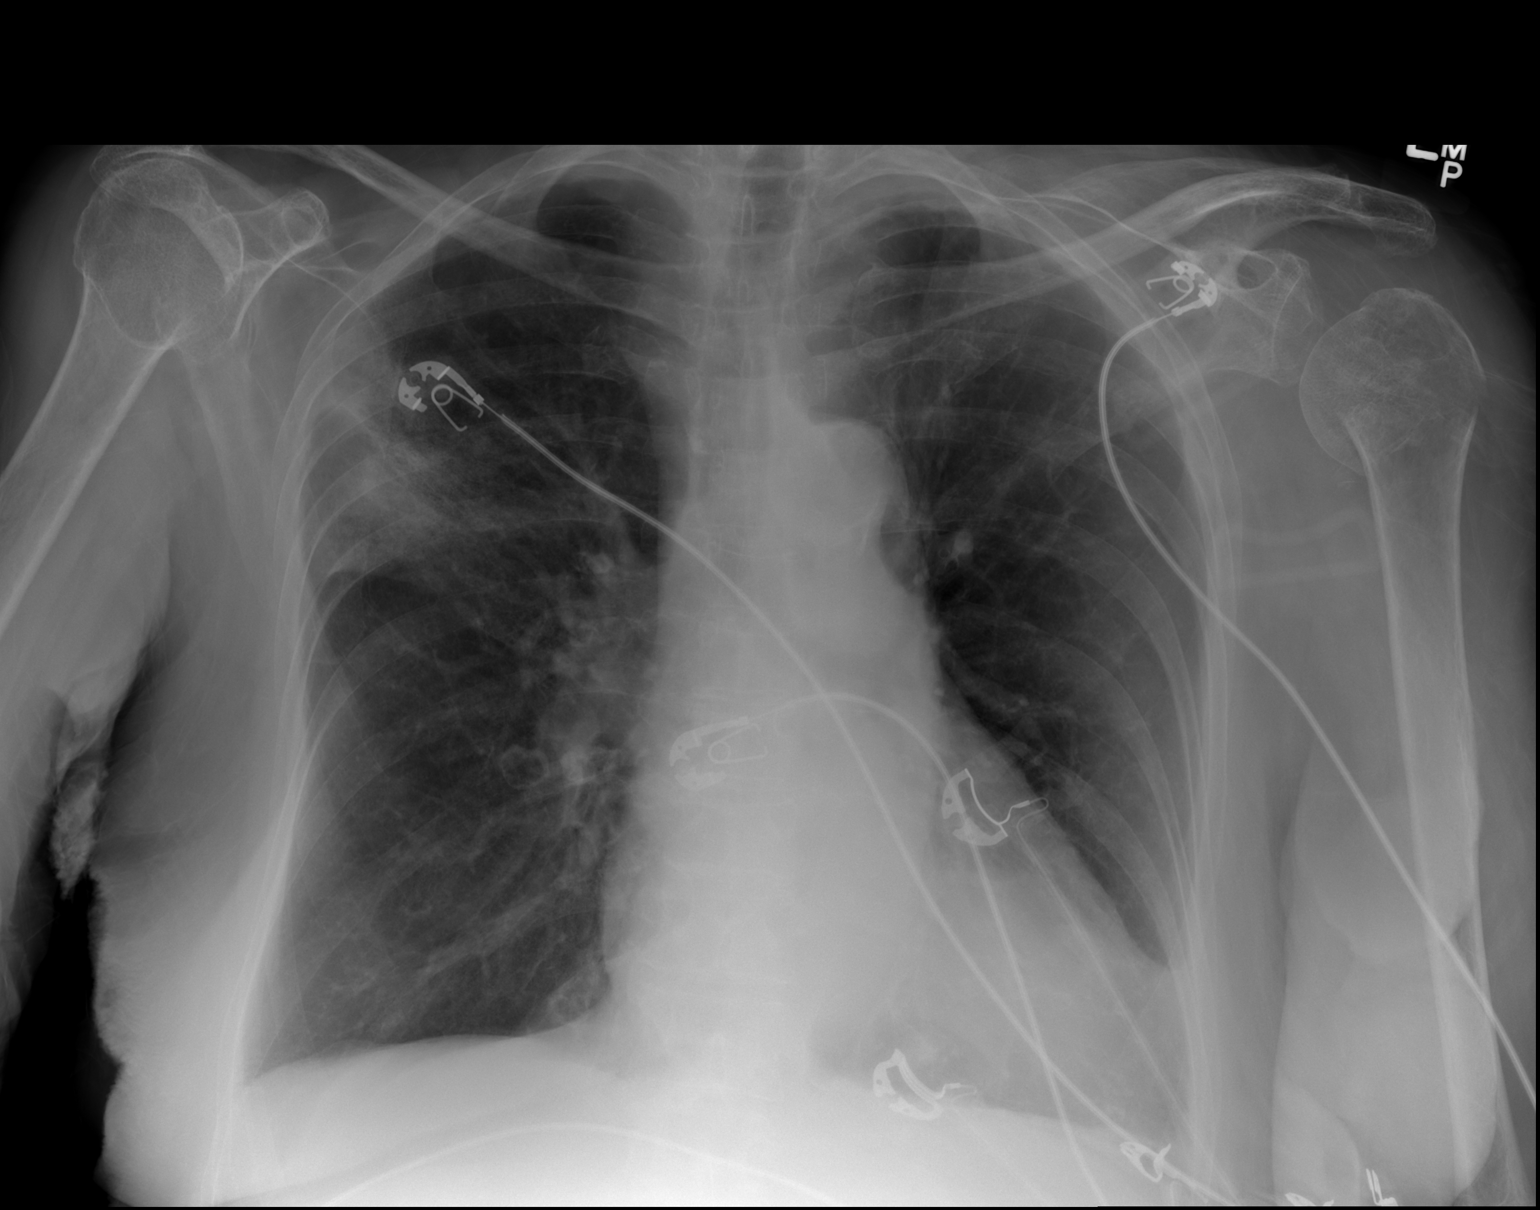

[2 of 2 positions shown; findings below may reference images not displayed]

FINDINGS: There is a consolidated right upper lobe infiltrate with mild volume
loss. Hilar, mediastinal and cardiac contours are unremarkable and
unchanged. There are no effusions.
IMPRESSION: Right upper lobe infiltrate, possibly pneumonia or aspiration.
Follow-up is necessary to confirm complete clearing.

## 2016-07-02 IMAGING — RF DG SWALLOWING FUNCTION
1 series · 1 of 1 positions shown · non-contrast
Comparison: none

[Series 1: run · 1 of 1 slices shown]
[im 1/1]
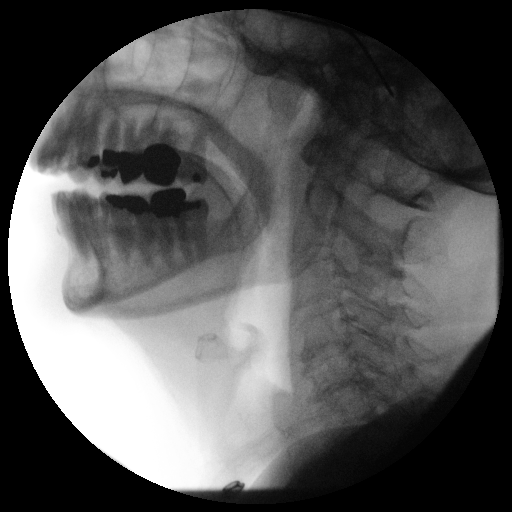

[1 of 1 positions shown; findings below may reference images not displayed]

FLUOROSCOPY FOR SWALLOWING FUNCTION STUDY:
Fluoroscopy was provided for swallowing function study, which was administered by a speech pathologist.  Final results and recommendations from this study are contained within the speech pathology report.
